# Patient Record
Sex: Female | Born: 1950 | Race: White | Hispanic: No | Marital: Married | Smoking: Former smoker
Health system: Southern US, Community
[De-identification: ages and names within clinical notes are randomized; demographics above are authoritative.]

## PROBLEM LIST (undated history)

## (undated) DIAGNOSIS — K602 Anal fissure, unspecified: Secondary | ICD-10-CM

## (undated) DIAGNOSIS — I4891 Unspecified atrial fibrillation: Secondary | ICD-10-CM

## (undated) DIAGNOSIS — F329 Major depressive disorder, single episode, unspecified: Secondary | ICD-10-CM

## (undated) DIAGNOSIS — R011 Cardiac murmur, unspecified: Secondary | ICD-10-CM

## (undated) DIAGNOSIS — F419 Anxiety disorder, unspecified: Secondary | ICD-10-CM

## (undated) DIAGNOSIS — B019 Varicella without complication: Secondary | ICD-10-CM

## (undated) DIAGNOSIS — Q249 Congenital malformation of heart, unspecified: Secondary | ICD-10-CM

## (undated) DIAGNOSIS — I340 Nonrheumatic mitral (valve) insufficiency: Secondary | ICD-10-CM

## (undated) DIAGNOSIS — I421 Obstructive hypertrophic cardiomyopathy: Secondary | ICD-10-CM

## (undated) DIAGNOSIS — G4730 Sleep apnea, unspecified: Secondary | ICD-10-CM

## (undated) DIAGNOSIS — F32A Depression, unspecified: Secondary | ICD-10-CM

## (undated) DIAGNOSIS — K219 Gastro-esophageal reflux disease without esophagitis: Secondary | ICD-10-CM

## (undated) HISTORY — DX: Depression, unspecified: F32.A

## (undated) HISTORY — DX: Congenital malformation of heart, unspecified: Q24.9

## (undated) HISTORY — DX: Cardiac murmur, unspecified: R01.1

## (undated) HISTORY — DX: Varicella without complication: B01.9

## (undated) HISTORY — DX: Unspecified atrial fibrillation: I48.91

## (undated) HISTORY — PX: ABDOMINAL HYSTERECTOMY: SHX81

## (undated) HISTORY — DX: Anxiety disorder, unspecified: F41.9

## (undated) HISTORY — DX: Major depressive disorder, single episode, unspecified: F32.9

## (undated) HISTORY — PX: TOOTH EXTRACTION: SUR596

## (undated) HISTORY — DX: Gastro-esophageal reflux disease without esophagitis: K21.9

## (undated) HISTORY — DX: Anal fissure, unspecified: K60.2

---
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
## 1997-08-29 HISTORY — PX: AUGMENTATION MAMMAPLASTY: SUR837

## 1998-08-29 HISTORY — PX: BREAST ENHANCEMENT SURGERY: SHX7

## 2009-08-01 ENCOUNTER — Other Ambulatory Visit: Payer: Self-pay

## 2009-08-01 ENCOUNTER — Other Ambulatory Visit: Payer: Self-pay | Admitting: Emergency Medicine

## 2009-11-11 ENCOUNTER — Other Ambulatory Visit: Payer: Self-pay | Admitting: Cardiology

## 2014-01-23 ENCOUNTER — Encounter (INDEPENDENT_AMBULATORY_CARE_PROVIDER_SITE_OTHER): Payer: Self-pay

## 2014-01-23 ENCOUNTER — Ambulatory Visit (INDEPENDENT_AMBULATORY_CARE_PROVIDER_SITE_OTHER): Payer: 59 | Admitting: Family Medicine

## 2014-01-23 ENCOUNTER — Encounter: Payer: Self-pay | Admitting: Family Medicine

## 2014-01-23 VITALS — BP 124/78 | HR 83 | Temp 98.4°F | Ht 67.75 in | Wt 208.5 lb

## 2014-01-23 DIAGNOSIS — R5381 Other malaise: Secondary | ICD-10-CM | POA: Insufficient documentation

## 2014-01-23 DIAGNOSIS — R5383 Other fatigue: Secondary | ICD-10-CM

## 2014-01-23 DIAGNOSIS — R1310 Dysphagia, unspecified: Secondary | ICD-10-CM | POA: Insufficient documentation

## 2014-01-23 LAB — CBC WITH DIFFERENTIAL/PLATELET
BASOS ABS: 0 10*3/uL (ref 0.0–0.1)
Basophils Relative: 0.1 % (ref 0.0–3.0)
EOS ABS: 0.1 10*3/uL (ref 0.0–0.7)
Eosinophils Relative: 1.6 % (ref 0.0–5.0)
HCT: 41.4 % (ref 36.0–46.0)
Hemoglobin: 14 g/dL (ref 12.0–15.0)
LYMPHS PCT: 39.3 % (ref 12.0–46.0)
Lymphs Abs: 1.9 10*3/uL (ref 0.7–4.0)
MCHC: 33.8 g/dL (ref 30.0–36.0)
MCV: 88.9 fl (ref 78.0–100.0)
MONOS PCT: 8.2 % (ref 3.0–12.0)
Monocytes Absolute: 0.4 10*3/uL (ref 0.1–1.0)
NEUTROS PCT: 50.8 % (ref 43.0–77.0)
Neutro Abs: 2.5 10*3/uL (ref 1.4–7.7)
PLATELETS: 209 10*3/uL (ref 150.0–400.0)
RBC: 4.66 Mil/uL (ref 3.87–5.11)
RDW: 13.8 % (ref 11.5–15.5)
WBC: 4.9 10*3/uL (ref 4.0–10.5)

## 2014-01-23 LAB — COMPREHENSIVE METABOLIC PANEL
ALT: 18 U/L (ref 0–35)
AST: 25 U/L (ref 0–37)
Albumin: 4.2 g/dL (ref 3.5–5.2)
Alkaline Phosphatase: 55 U/L (ref 39–117)
BUN: 18 mg/dL (ref 6–23)
CALCIUM: 9.5 mg/dL (ref 8.4–10.5)
CHLORIDE: 106 meq/L (ref 96–112)
CO2: 24 mEq/L (ref 19–32)
Creatinine, Ser: 1 mg/dL (ref 0.4–1.2)
GFR: 57.26 mL/min — AB (ref >60.00)
Glucose, Bld: 98 mg/dL (ref 70–99)
Potassium: 4.1 mEq/L (ref 3.5–5.1)
Sodium: 138 mEq/L (ref 135–145)
Total Bilirubin: 0.6 mg/dL (ref 0.2–1.2)
Total Protein: 7.3 g/dL (ref 6.0–8.3)

## 2014-01-23 LAB — TSH: TSH: 3.67 u[IU]/mL (ref 0.35–4.50)

## 2014-01-23 LAB — VITAMIN B12: Vitamin B-12: 209 pg/mL — ABNORMAL LOW (ref 211–911)

## 2014-01-23 LAB — T4, FREE: Free T4: 0.64 ng/dL (ref 0.60–1.60)

## 2014-01-23 NOTE — Progress Notes (Signed)
Pre visit review using our clinic review tool, if applicable. No additional management support is needed unless otherwise documented below in the visit note. 

## 2014-01-23 NOTE — Assessment & Plan Note (Signed)
Labs today. She will return in 1 month- overdue for CPX.

## 2014-01-23 NOTE — Progress Notes (Signed)
   Subjective:   Patient ID: Molly Brown, female    DOB: 02/19/1951, 63 y.o.   MRN: [id number]  Molly Brown is a pleasant 63 y.o. year old female who presents to clinic today with Establish Care and Gastrophageal Reflux  on 01/23/2014  HPI: Has not been to a doctor in over 5 years.  Dysphagia- difficulty swallowing food for past several months.  Difficulty swallowing certain foods and liquids.  Getting progressively worse.  At times, has to make herself regurgitate.  Feels stuck in epigastric area.   Denies any changes in bowel in habits. Has never had a colonoscopy or endoscopy. Does have h/o GERD. No weight loss.  Does eat more mash potatoes and ice cream because it goes down easier. No smoking or ETOH use.  Has not had a mammogram in 5 years.  Not taking any PPIs or H2 blockers- not having GERD symptoms only dysphagia at this point.  She did start taking probiotics over a month ago.  Patient Active Problem List   Diagnosis Date Noted  . Dysphagia, unspecified(787.20) 01/23/2014   Past Medical History  Diagnosis Date  . Depression   . Chicken pox   . GERD (gastroesophageal reflux disease)   . Cardiac arrhythmia due to congenital heart disease   . Heart murmur    Past Surgical History  Procedure Laterality Date  . Tonsillectomy  1972  . Abdominal hysterectomy    . Breast enhancement surgery  2000  . Tooth extraction     History  Substance Use Topics  . Smoking status: Former Games developer  . Smokeless tobacco: Never Used  . Alcohol Use: No   Family History  Problem Relation Age of Onset  . Arthritis Mother   . Heart disease Father   . Arthritis Maternal Grandmother   . Arthritis Paternal Grandmother   . Heart disease Paternal Grandmother    No Known Allergies No current outpatient prescriptions on file prior to visit.   No current facility-administered medications on file prior to visit.   The PMH, PSH, Social History, Family History, Medications, and allergies  have been reviewed in [facility], and have been updated if relevant.   Review of Systems    See HPI No black stools +fatigue- since after she had the flu in Christmas 2014- probiotics have helped a little with fatigue. No fever No CP or SOB. Denies feeling anxious or depressed Objective:    BP 124/78  Pulse 83  Temp(Src) 98.4 F (36.9 C) (Oral)  Ht 5' 7.75" (1.721 m)  Wt 208 lb 8 oz (94.575 kg)  BMI 31.93 kg/m2  SpO2 98%   Physical Exam  Nursing note and vitals reviewed. Constitutional: She appears well-developed and well-nourished. No distress.  Abdominal: Soft. Bowel sounds are normal. She exhibits no distension and no mass. There is no tenderness. There is no rebound and no guarding.  Skin: Skin is warm and dry.  Psychiatric: She has a normal mood and affect. Her behavior is normal. Judgment and thought content normal.          Assessment & Plan:   Dysphagia, unspecified(787.20) No Follow-up on file.

## 2014-01-23 NOTE — Patient Instructions (Signed)
It was nice to meet you. After you go to the lab, please stop by to see Shirlee Limerick on your way out to set your referral to see a stomach doctor.

## 2014-01-23 NOTE — Assessment & Plan Note (Signed)
Refer to GI for endoscopy-? Esophageal stricture- also need to rule out malignancy although she is low risk for this. The patient indicates understanding of these issues and agrees with the plan.

## 2014-01-24 LAB — HELICOBACTER PYLORI ABS-IGG+IGA, BLD: HELICOBACTER PYLORI AB, IGA: 8.1 U/mL (ref <9.0)

## 2014-01-24 LAB — VITAMIN D 25 HYDROXY (VIT D DEFICIENCY, FRACTURES): Vit D, 25-Hydroxy: 64 ng/mL (ref 30–89)

## 2014-01-31 ENCOUNTER — Ambulatory Visit (INDEPENDENT_AMBULATORY_CARE_PROVIDER_SITE_OTHER): Payer: 59 | Admitting: Gastroenterology

## 2014-01-31 ENCOUNTER — Encounter: Payer: Self-pay | Admitting: Gastroenterology

## 2014-01-31 VITALS — BP 116/70 | HR 92 | Ht 67.5 in | Wt 209.5 lb

## 2014-01-31 DIAGNOSIS — K219 Gastro-esophageal reflux disease without esophagitis: Secondary | ICD-10-CM

## 2014-01-31 DIAGNOSIS — R1314 Dysphagia, pharyngoesophageal phase: Secondary | ICD-10-CM

## 2014-01-31 MED ORDER — OMEPRAZOLE 40 MG PO CPDR: 40.0000 mg | DELAYED_RELEASE_CAPSULE | Freq: Every day | ORAL | Status: DC

## 2014-01-31 NOTE — Patient Instructions (Addendum)
  Please start once daily omeprazole (OTC), take one pill 20-30 min before first meal of the day. In meantime chew well, eat slowly and take small bites.You have been scheduled for an endoscopy with propofol. Please follow written instructions given to you at your visit today. If you use inhalers (even only as needed), please bring them with you on the day of your procedure. Your physician has requested that you go to www.startemmi.com and enter the access code given to you at your visit today. This web site gives a general overview about your procedure. However, you should still follow specific instructions given to you by our office regarding your preparation for the procedure.   We have sent the following medications to your pharmacy for you to pick up at your convenience:

## 2014-01-31 NOTE — Progress Notes (Signed)
HPI: This is a  very pleasant 63 year old woman whom I am meeting for the first time today.  pcp Dr. Ruthe Mannanalia Aron  Dysphagia for about a year, progressive.  Started with pyrosis.  Progressed to dysphagia, solid.  Has had to regurgitate.  A bit liquid as well.  She was taking alkaseltzer plus (for heartburn). She tried prilosec a bit.  Overall her weight has been stable.  Review of systems: Pertinent positive and negative review of systems were noted in the above HPI section. Complete review of systems was performed and was otherwise normal.    Past Medical History  Diagnosis Date  . Depression   . Chicken pox   . GERD (gastroesophageal reflux disease)   . Cardiac arrhythmia due to congenital heart disease   . Heart murmur   . Anal fissure   . Anxiety   . AF (atrial fibrillation)     Past Surgical History  Procedure Laterality Date  . Tonsillectomy  1972  . Abdominal hysterectomy    . Breast enhancement surgery  2000  . Tooth extraction      Current Outpatient Prescriptions  Medication Sig Dispense Refill  . acetaminophen (TYLENOL) 325 MG tablet Take 650 mg by mouth every 6 (six) hours as needed.      Marland Kitchen. aspirin 81 MG tablet Take 81 mg by mouth daily.      Marland Kitchen. aspirin-sod bicarb-citric acid (ALKA-SELTZER) 325 MG TBEF tablet Take 325 mg by mouth as needed.      . Multiple Vitamin (MULTIVITAMIN) LIQD Take 5 mLs by mouth daily.      . polyethylene glycol (MIRALAX / GLYCOLAX) packet Take 17 g by mouth as needed.      . Probiotic Product (PROBIOTIC DAILY) CAPS Take by mouth.       No current facility-administered medications for this visit.    Allergies as of 01/31/2014  . (No Known Allergies)    Family History  Problem Relation Age of Onset  . Arthritis Mother   . Heart disease Father   . Arthritis Maternal Grandmother   . Arthritis Paternal Grandmother   . Heart disease Paternal Grandmother     History   Social History  . Marital Status: Married    Spouse Name:  N/A    Number of Children: 3  . Years of Education: N/A   Occupational History  . homemaker    Social History Main Topics  . Smoking status: Former Smoker    Types: Cigarettes  . Smokeless tobacco: Never Used  . Alcohol Use: No  . Drug Use: No  . Sexual Activity: No   Other Topics Concern  . Not on file   Social History Narrative  . No narrative on file       Physical Exam: BP 116/70  Pulse 92  Ht 5' 7.5" (1.715 m)  Wt 209 lb 8 oz (95.029 kg)  BMI 32.31 kg/m2 Constitutional: generally well-appearing Psychiatric: alert and oriented x3 Eyes: extraocular movements intact Mouth: oral pharynx moist, no lesions Neck: supple no lymphadenopathy Cardiovascular: heart regular rate and rhythm Lungs: clear to auscultation bilaterally Abdomen: soft, nontender, nondistended, no obvious ascites, no peritoneal signs, normal bowel sounds Extremities: no lower extremity edema bilaterally Skin: no lesions on visible extremities    Assessment and plan: 63 y.o. female with  progressive, mainly solid food dysphagia over the past year previously associated with GERD. 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 She is going to start once daily  proton pump inhibitor. We will proceed with EGD at her soonest convenience. Given her lack of weight loss my suspicion for significant neoplasm is low.

## 2014-02-05 ENCOUNTER — Telehealth: Payer: Self-pay | Admitting: Gastroenterology

## 2014-02-05 ENCOUNTER — Encounter: Payer: Self-pay | Admitting: Cardiology

## 2014-02-05 ENCOUNTER — Ambulatory Visit: Payer: 59 | Admitting: Gastroenterology

## 2014-02-05 VITALS — BP 126/87 | Temp 97.4°F | Ht 67.5 in | Wt 209.0 lb

## 2014-02-05 DIAGNOSIS — R1314 Dysphagia, pharyngoesophageal phase: Secondary | ICD-10-CM

## 2014-02-05 DIAGNOSIS — I4891 Unspecified atrial fibrillation: Secondary | ICD-10-CM

## 2014-02-05 NOTE — Telephone Encounter (Signed)
Dr. Myrtis Ser cell phone [phone], Dr. Christella Hartigan to call back regarding patient.

## 2014-02-05 NOTE — Progress Notes (Addendum)
Pt arrived to admitting for EGD and vital signs obtained.  Her HR was unstable so she was put a monitor and strip was obtained.  Pt is in atrial fibrilation. Pt has a hx of afib but is not currently taking any medications.  She has not seen a cardiologist in 5 years.  She states that she has noticed feeling very tired lately.   Dr. Christella Hartigan was notified.   MD discussed this with pt and decided that procedure would be cancelled.  We are attempting to have pt referred to cardiologist today.      She was found to be in afib today.  Not rapid response. Feels fine (no CP, no SOB).  A bit fatigued lately. Was in afib 5 years ago, stopped meds, hasn't seen cards in 5 years.  HEr swallowing is much improved, dysphagia nearly gone since starting omeprazole.  I have cancelled the EGD for today.  We contacted heartcare about new office appt. Family asked about fitting her in today but given non emergent, urgent nature that was not possible. She will be called by heartcare about appt and knows to contact my office afterwards.

## 2014-02-05 NOTE — Progress Notes (Signed)
Unable to obtain appointment for pt today but referral was made to cardiology.  Pt D/C home with husband.

## 2014-02-05 NOTE — Progress Notes (Signed)
Patient ID: Molly Brown, female   DOB: 1950/12/21, 63 y.o.   MRN: [id number]  The patient was going to have an endoscopy today by Dr. Christella Hartigan. She noted that she was feeling better on her PPI. Also it was noted that she had atrial fibrillation. She was stable with this. Considering all factors, decision was made to not proceed with endoscopy. The patient told Dr. Christella Hartigan that she had had atrial fibrillation in the past. However there is been no known cardiology followup. He called and we discussed the issue. She lives in [place]. Decision was made to make her a an appointment with our cardiology team in the [facility] office to assess her atrial fibrillation. She has no symptoms at this time.  Jerral Bonito, MD

## 2014-02-07 ENCOUNTER — Ambulatory Visit (INDEPENDENT_AMBULATORY_CARE_PROVIDER_SITE_OTHER): Payer: 59 | Admitting: Cardiovascular Disease

## 2014-02-07 ENCOUNTER — Encounter: Payer: Self-pay | Admitting: Cardiovascular Disease

## 2014-02-07 VITALS — BP 134/95 | HR 92 | Ht 67.0 in | Wt 213.5 lb

## 2014-02-07 DIAGNOSIS — I4891 Unspecified atrial fibrillation: Secondary | ICD-10-CM

## 2014-02-07 DIAGNOSIS — R000 Tachycardia, unspecified: Secondary | ICD-10-CM

## 2014-02-07 DIAGNOSIS — I482 Chronic atrial fibrillation, unspecified: Secondary | ICD-10-CM | POA: Insufficient documentation

## 2014-02-07 MED ORDER — METOPROLOL TARTRATE 25 MG PO TABS: 25.0000 mg | ORAL_TABLET | Freq: Two times a day (BID) | ORAL | Status: DC

## 2014-02-07 MED ORDER — APIXABAN 5 MG PO TABS: 5.0000 mg | ORAL_TABLET | Freq: Two times a day (BID) | ORAL | Status: DC

## 2014-02-07 NOTE — Patient Instructions (Signed)
Medication changes:   STOP aspirin                                     Start:  Eliquis 5 mg 1 tablet twice a day                                     Start:  Metoprolol tartrate 25mg  1 tablet twice a day  Your physician has requested that you have an echocardiogram before your cardioversion.  Echocardiography is a painless test that uses sound waves to create images of your heart. It provides your doctor with information about the size and shape of your heart and how well your heart's chambers and valves are working. This procedure takes approximately one hour. There are no restrictions for this procedure.  Your physician has recommended that you have a Cardioversion (DCCV) in 3 weeks. We will call you when we have this scheduled. You will also need lab work before your cardioversion.   Electrical Cardioversion uses a jolt of electricity to your heart either through paddles or wired patches attached to your chest. This is a controlled, usually prescheduled, procedure. Defibrillation is done under light anesthesia in the hospital, and you usually go home the day of the procedure. This is done to get your heart back into a normal rhythm. You are not awake for the procedure. Please see the instruction sheet given to you today.  Your physician recommends that you return for lab work when you come in for your ECHO before your cardioversion:   Bmp, cbc-d, pt/inr
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
## 2014-02-07 NOTE — Assessment & Plan Note (Signed)
The patient seems to have paroxysmal atrial fibrillation. Currently, she seems to be very symptomatic with significant exertional dyspnea palpitations. Thus, I favor attempting to restore sinus rhythm especially with relatively young age. I started her on metoprolol 25 mg twice daily. I also initiated anticoagulation with Eliquis 5 mg twice daily. Plan cardioversion in 3 weeks. Obtain an echocardiogram in the meanwhile. Risk of cardioversion was discussed with the patient. CHADS2 VASc score is 1. Thus, we might be able to discontinue anticoagulation one month after maintaining sinus rhythm.

## 2014-02-11 ENCOUNTER — Telehealth: Payer: Self-pay | Admitting: *Deleted

## 2014-02-11 ENCOUNTER — Encounter: Payer: Self-pay | Admitting: *Deleted

## 2014-02-11 DIAGNOSIS — I4891 Unspecified atrial fibrillation: Secondary | ICD-10-CM

## 2014-02-11 NOTE — Telephone Encounter (Signed)
Your physician has recommended that you have a Cardioversion (DCCV). Electrical Cardioversion uses a jolt of electricity to your heart either through paddles or wired patches attached to your chest. This is a controlled, usually prescheduled, procedure. Defibrillation is done under light anesthesia in the hospital, and you usually go home the day of the procedure. This is done to get your heart back into a normal rhythm. You are not awake for the procedure. Please see the instruction sheet given to you today.  Spoke with patient.  Informed her that her cardioversion will be 02/27/14 at 0730 am  [facility]30 am  Reviewed instructions and lab/EKG time and date (see letter)  Patient verbalized understanding

## 2014-02-21 ENCOUNTER — Other Ambulatory Visit: Payer: Self-pay

## 2014-02-21 ENCOUNTER — Ambulatory Visit (INDEPENDENT_AMBULATORY_CARE_PROVIDER_SITE_OTHER): Payer: 59 | Admitting: *Deleted

## 2014-02-21 ENCOUNTER — Other Ambulatory Visit (INDEPENDENT_AMBULATORY_CARE_PROVIDER_SITE_OTHER): Payer: 59

## 2014-02-21 VITALS — Ht 67.0 in | Wt 215.0 lb

## 2014-02-21 DIAGNOSIS — I059 Rheumatic mitral valve disease, unspecified: Secondary | ICD-10-CM

## 2014-02-21 DIAGNOSIS — I4891 Unspecified atrial fibrillation: Secondary | ICD-10-CM

## 2014-02-21 DIAGNOSIS — R000 Tachycardia, unspecified: Secondary | ICD-10-CM

## 2014-02-21 DIAGNOSIS — I359 Nonrheumatic aortic valve disorder, unspecified: Secondary | ICD-10-CM

## 2014-02-21 NOTE — Patient Instructions (Signed)
Patient is going for a cardioversion.

## 2014-02-22 LAB — BASIC METABOLIC PANEL
BUN/Creatinine Ratio: 17 (ref 11–26)
BUN: 18 mg/dL (ref 8–27)
CALCIUM: 9.6 mg/dL (ref 8.7–10.3)
CO2: 23 mmol/L (ref 18–29)
Chloride: 102 mmol/L (ref 97–108)
Creatinine, Ser: 1.05 mg/dL — ABNORMAL HIGH (ref 0.57–1.00)
GFR calc Af Amer: 66 mL/min/{1.73_m2} (ref >59)
GFR, EST NON AFRICAN AMERICAN: 57 mL/min/{1.73_m2} — AB (ref >59)
Glucose: 116 mg/dL — ABNORMAL HIGH (ref 65–99)
POTASSIUM: 4.4 mmol/L (ref 3.5–5.2)
Sodium: 141 mmol/L (ref 134–144)

## 2014-02-22 LAB — PROTIME-INR
INR: 1 (ref 0.8–1.2)
Prothrombin Time: 10.4 s (ref 9.1–12.0)

## 2014-02-22 LAB — CBC WITH DIFFERENTIAL/PLATELET
BASOS: 0 %
Basophils Absolute: 0 10*3/uL (ref 0.0–0.2)
EOS ABS: 0.2 10*3/uL (ref 0.0–0.4)
EOS: 4 %
HCT: 40.6 % (ref 34.0–46.6)
Hemoglobin: 13.5 g/dL (ref 11.1–15.9)
IMMATURE GRANS (ABS): 0 10*3/uL (ref 0.0–0.1)
Immature Granulocytes: 0 %
Lymphocytes Absolute: 1.6 10*3/uL (ref 0.7–3.1)
Lymphs: 34 %
MCH: 29.7 pg (ref 26.6–33.0)
MCHC: 33.3 g/dL (ref 31.5–35.7)
MCV: 89 fL (ref 79–97)
Monocytes Absolute: 0.4 10*3/uL (ref 0.1–0.9)
Monocytes: 8 %
NEUTROS PCT: 54 %
Neutrophils Absolute: 2.5 10*3/uL (ref 1.4–7.0)
RBC: 4.55 x10E6/uL (ref 3.77–5.28)
RDW: 14.2 % (ref 12.3–15.4)
WBC: 4.6 10*3/uL (ref 3.4–10.8)

## 2014-02-24 ENCOUNTER — Encounter: Payer: Self-pay | Admitting: Gastroenterology

## 2014-02-24 ENCOUNTER — Encounter: Payer: Self-pay | Admitting: Family Medicine

## 2014-02-24 ENCOUNTER — Ambulatory Visit (INDEPENDENT_AMBULATORY_CARE_PROVIDER_SITE_OTHER): Payer: 59 | Admitting: Family Medicine

## 2014-02-24 ENCOUNTER — Other Ambulatory Visit (HOSPITAL_COMMUNITY)
Admission: RE | Admit: 2014-02-24 | Discharge: 2014-02-24 | Disposition: A | Discharge status: Home / Self Care | Payer: 59 | Source: Ambulatory Visit | Attending: Family Medicine | Admitting: Family Medicine

## 2014-02-24 VITALS — BP 130/78 | HR 81 | Temp 98.1°F | Ht 67.75 in | Wt 212.5 lb

## 2014-02-24 DIAGNOSIS — Z1151 Encounter for screening for human papillomavirus (HPV): Secondary | ICD-10-CM | POA: Diagnosis present

## 2014-02-24 DIAGNOSIS — Z1211 Encounter for screening for malignant neoplasm of colon: Secondary | ICD-10-CM

## 2014-02-24 DIAGNOSIS — Z1231 Encounter for screening mammogram for malignant neoplasm of breast: Secondary | ICD-10-CM

## 2014-02-24 DIAGNOSIS — I4891 Unspecified atrial fibrillation: Secondary | ICD-10-CM

## 2014-02-24 DIAGNOSIS — Z01419 Encounter for gynecological examination (general) (routine) without abnormal findings: Secondary | ICD-10-CM | POA: Insufficient documentation

## 2014-02-24 DIAGNOSIS — Z0000 Encounter for general adult medical examination without abnormal findings: Secondary | ICD-10-CM | POA: Insufficient documentation

## 2014-02-24 DIAGNOSIS — R1310 Dysphagia, unspecified: Secondary | ICD-10-CM

## 2014-02-24 DIAGNOSIS — I480 Paroxysmal atrial fibrillation: Secondary | ICD-10-CM

## 2014-02-24 DIAGNOSIS — Z136 Encounter for screening for cardiovascular disorders: Secondary | ICD-10-CM

## 2014-02-24 LAB — COMPREHENSIVE METABOLIC PANEL
ALT: 18 U/L (ref 0–35)
AST: 25 U/L (ref 0–37)
Albumin: 4.4 g/dL (ref 3.5–5.2)
Alkaline Phosphatase: 62 U/L (ref 39–117)
BILIRUBIN TOTAL: 0.6 mg/dL (ref 0.2–1.2)
BUN: 17 mg/dL (ref 6–23)
CHLORIDE: 105 meq/L (ref 96–112)
CO2: 26 mEq/L (ref 19–32)
CREATININE: 1.1 mg/dL (ref 0.4–1.2)
Calcium: 9.4 mg/dL (ref 8.4–10.5)
GFR: 53.66 mL/min — ABNORMAL LOW (ref >60.00)
GLUCOSE: 104 mg/dL — AB (ref 70–99)
Potassium: 4.2 mEq/L (ref 3.5–5.1)
Sodium: 139 mEq/L (ref 135–145)
Total Protein: 7.8 g/dL (ref 6.0–8.3)

## 2014-02-24 LAB — LIPID PANEL
CHOLESTEROL: 233 mg/dL — AB (ref 0–200)
HDL: 44.3 mg/dL (ref >39.00)
LDL Cholesterol: 156 mg/dL — ABNORMAL HIGH (ref 0–99)
NONHDL: 188.7
Total CHOL/HDL Ratio: 5
Triglycerides: 166 mg/dL — ABNORMAL HIGH (ref 0.0–149.0)
VLDL: 33.2 mg/dL (ref 0.0–40.0)

## 2014-02-24 NOTE — Progress Notes (Signed)
Pre visit review using our clinic review tool, if applicable. No additional management support is needed unless otherwise documented below in the visit note. 

## 2014-02-24 NOTE — Assessment & Plan Note (Signed)
On eliquis, lopressor. Scheduled for cardioversion this week (Dr. Kirke CorinArida).

## 2014-02-24 NOTE — Progress Notes (Signed)
Subjective:   Patient ID: Molly Brown, female    DOB: 02/16/1951, 63 y.o.   MRN: [id number]  Molly PortLinda Dearcos is a pleasant 63 y.o. year old female who presents to clinic today with Annual Exam  on 02/24/2014  HPI: Established care with me on 5/28- abdominal exam only at that time- as her complaint was dysphagia. Referred to GI for EGD- scheduled for 02/05/14- did not do EGD because was found to be in afib. Symptoms have resolved since starting omeprazole.  Dr. Christella HartiganJacobs has not rescheduled EGD yet.  Referred her to Dr. Kirke CorinArida- saw him on 6/12- note reviewed- diagnosed with paroxysmal a fib- started on Metoprolol, Eliquis, and scheduled cardioversion for 7/2.  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 Has never had a colonoscopy. Due for mammogram.  Lab Results  Component Value Date   WBC 4.6 02/21/2014   HGB 13.5 02/21/2014   HCT 40.6 02/21/2014   MCV 89 02/21/2014   PLT 209.0 01/23/2014   Lab Results  Component Value Date   CREATININE 1.05* 02/21/2014     Current Outpatient Prescriptions on File Prior to Visit  Medication Sig Dispense Refill  . acetaminophen (TYLENOL) 325 MG tablet Take 650 mg by mouth every 6 (six) hours as needed.      Marland Kitchen. apixaban (ELIQUIS) 5 MG TABS tablet Take 1 tablet (5 mg total) by mouth 2 (two) times daily.  60 tablet  6  . metoprolol tartrate (LOPRESSOR) 25 MG tablet Take 1 tablet (25 mg total) by mouth 2 (two) times daily.  180 tablet  3  . Multiple Vitamin (MULTIVITAMIN) LIQD Take 5 mLs by mouth daily.      Marland Kitchen. omeprazole (PRILOSEC) 40 MG capsule Take 1 capsule (40 mg total) by mouth daily.  90 capsule  3  . polyethylene glycol (MIRALAX / GLYCOLAX) packet Take 17 g by mouth as needed.      . Probiotic Product (PROBIOTIC DAILY) CAPS Take by mouth.       No current facility-administered medications on file prior to visit.    No Known Allergies  Past Medical History  Diagnosis Date  . Depression   . Chicken  pox   . GERD (gastroesophageal reflux disease)   . Cardiac arrhythmia due to congenital heart disease   . Heart murmur   . Anal fissure   . Anxiety   . AF (atrial fibrillation)     Past Surgical History  Procedure Laterality Date  . Tonsillectomy  1972  . Abdominal hysterectomy    . Breast enhancement surgery  2000  . Tooth extraction      Family History  Problem Relation Age of Onset  . Arthritis Mother   . Heart disease Father   . Arthritis Maternal Grandmother   . Arthritis Paternal Grandmother   . Heart disease Paternal Grandmother     History   Social History  . Marital Status: Married    Spouse Name: N/A    Number of Children: 3  . Years of Education: N/A   Occupational History  . homemaker    Social History Main Topics  . Smoking status: Former Smoker    Types: Cigarettes  . Smokeless tobacco: Never Used  . Alcohol Use: No  . Drug Use: No  . Sexual Activity: No   Other Topics Concern  . Not on file   Social History Narrative  . No narrative on file   The PMH, PSH, Social History,  Family History, Medications, and allergies [facility], and have been updated if relevant.   Review of Systems See HPI    Fatigue improved Denies any CP or palpitations No blood in stool No changes in bowel habits Objective:    BP 130/78  Pulse 81  Temp(Src) 98.1 F (36.7 C) (Oral)  Ht 5' 7.75" (1.721 m)  Wt 212 lb 8 oz (96.389 kg)  BMI 32.54 kg/m2  SpO2 98%   Physical Exam    General:  Well-developed,well-nourished,in no acute distress; alert,appropriate and cooperative throughout examination Head:  normocephalic and atraumatic.   Eyes:  vision grossly intact, pupils equal, pupils round, and pupils reactive to light.   Ears:  R ear normal and L ear normal.   Nose:  no external deformity.   Mouth:  good dentition.   Neck:  No deformities, masses, or tenderness noted. Breasts:  No mass, nodules, thickening, tenderness, bulging,  retraction, inflamation, nipple discharge or skin changes noted.   Lungs:  Normal respiratory effort, chest expands symmetrically. Lungs are clear to auscultation, no crackles or wheezes. Heart:  Irregularly irregular Abdomen:  Bowel sounds positive,abdomen soft and non-tender without masses, organomegaly or hernias noted. Rectal:  no external abnormalities.   Genitalia:  Pelvic Exam:        External: normal female genitalia without lesions or masses        Vagina: normal without lesions or masses        Cervix: normal without lesions or masses        Adnexa: normal bimanual exam without masses or fullness        Uterus:absent        Pap smear: performed Msk:  No deformity or scoliosis noted of thoracic or lumbar spine.   Extremities:  No clubbing, cyanosis, edema, or deformity noted with normal full range of motion of all joints.   Neurologic:  alert & oriented X3 and gait normal.   Skin:  Intact without suspicious lesions or rashes Cervical Nodes:  No lymphadenopathy noted Axillary Nodes:  No palpable lymphadenopathy Psych:  Cognition and judgment appear intact. Alert and cooperative with normal attention span and concentration. No apparent delusions, illusions, hallucinations      Assessment & Plan:   Routine general medical examination at a health care facility  Encounter for routine gynecological examination  Dysphagia, unspecified(787.20)  Paroxysmal atrial fibrillation No Follow-up on file.

## 2014-02-24 NOTE — Assessment & Plan Note (Signed)
Improved with omeprazole

## 2014-02-24 NOTE — Patient Instructions (Signed)
It was great to see you.  Check with your insurance to see if they will cover the shingles shot.  Please call to schedule your mammogram at your convenience.  Good luck with your cardioversion.

## 2014-02-24 NOTE — Assessment & Plan Note (Signed)
Pap smear done today- cervix was visualized during pelvic exam today. Mammogram ordered- she will call to set this up.

## 2014-02-24 NOTE — Assessment & Plan Note (Signed)
Reviewed preventive care protocols, scheduled due services, and updated immunizations Discussed nutrition, exercise, diet, and healthy lifestyle.  Orders Placed This Encounter  Procedures  . MM Digital Screening  . Comprehensive metabolic panel  . Lipid panel  . Ambulatory referral to Gastroenterology   She will check with her insurance company regarding zostavax coverage.

## 2014-02-25 LAB — CYTOLOGY - PAP

## 2014-02-26 ENCOUNTER — Encounter: Payer: Self-pay | Admitting: *Deleted

## 2014-02-27 ENCOUNTER — Ambulatory Visit: Payer: Self-pay | Admitting: Cardiovascular Disease

## 2014-02-27 DIAGNOSIS — I4891 Unspecified atrial fibrillation: Secondary | ICD-10-CM

## 2014-03-06 ENCOUNTER — Ambulatory Visit (INDEPENDENT_AMBULATORY_CARE_PROVIDER_SITE_OTHER): Payer: 59 | Admitting: Cardiovascular Disease

## 2014-03-06 ENCOUNTER — Encounter: Payer: Self-pay | Admitting: Cardiovascular Disease

## 2014-03-06 VITALS — BP 110/80 | HR 86 | Ht 67.0 in | Wt 213.5 lb

## 2014-03-06 DIAGNOSIS — I4891 Unspecified atrial fibrillation: Secondary | ICD-10-CM

## 2014-03-06 NOTE — Assessment & Plan Note (Signed)
The patient is back in atrial fibrillation after recent successful cardioversion to normal sinus rhythm. She seems to have persistent atrial fibrillation. Echocardiogram did show moderately dilated left atrium slightly dizzy possible that she had atrial fibrillation on and off for a period of time and might be difficult to keep her in sinus rhythm without an antiarrhythmic medication. Interestingly, symptoms improved significantly just with the addition of metoprolol. She does not feel significantly limited at the present time even though she is in atrial fibrillation. I discussed management options including continued rate control versus trying an antiarrhythmic medication or considering catheter ablation. Given that lack of significant symptoms at the present time, it might be just reasonable to continue with rate control. CHADS2 VASc score is 1. Thus, she does not require long-term anticoagulation currently. I asked her to continue Eliquis for another month given recent cardioversion. After that, she can switch to aspirin 81 mg once daily. I asked her to continue to monitor her symptoms. If she becomes symptomatic, the other options will be considered.

## 2014-03-06 NOTE — Patient Instructions (Signed)
Your physician recommends that you continue on your Eliquis for 1 month then switch to Aspirin 81 mg daily.   Your physician recommends that you schedule a follow-up appointment in:  3 months
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
## 2014-03-12 ENCOUNTER — Ambulatory Visit: Payer: 59 | Admitting: Gastroenterology

## 2014-03-14 ENCOUNTER — Ambulatory Visit (INDEPENDENT_AMBULATORY_CARE_PROVIDER_SITE_OTHER): Payer: 59 | Admitting: Family Medicine

## 2014-03-14 ENCOUNTER — Encounter: Payer: Self-pay | Admitting: Family Medicine

## 2014-03-14 VITALS — BP 118/70 | HR 79 | Temp 98.2°F | Wt 214.0 lb

## 2014-03-14 DIAGNOSIS — I4819 Other persistent atrial fibrillation: Secondary | ICD-10-CM

## 2014-03-14 DIAGNOSIS — Z23 Encounter for immunization: Secondary | ICD-10-CM

## 2014-03-14 DIAGNOSIS — I4891 Unspecified atrial fibrillation: Secondary | ICD-10-CM

## 2014-03-14 DIAGNOSIS — Z2911 Encounter for prophylactic immunotherapy for respiratory syncytial virus (RSV): Secondary | ICD-10-CM

## 2014-03-14 NOTE — Progress Notes (Signed)
Pre visit review using our clinic review tool, if applicable. No additional management support is needed unless otherwise documented below in the visit note. 

## 2014-03-14 NOTE — Progress Notes (Signed)
Subjective:   Patient ID: Molly Brown, female    DOB: October 11, 1950, 63 y.o.   MRN: [id number]  Molly Brown is a pleasant 63 y.o. year old female who presents to clinic today with Follow-up  on 03/14/2014  HPI: Afib-  Unfortunately back in afib after successful cardioversion.  Seeing Dr. Kirke Corin. Last saw him on 7/9- note reviewed.  Recommended staying on Eliquis for one more month (given recent cardioversion), the transition back to ASA 81 mg daily. Since her symptoms seems to be relatively controlled on metoprolol, she plans to continue this.  IF she does become symptomatic, they will need to address other alternatives like antiarrhythmic meds or catheter ablation.  She denies CP or SOB.  Lab Results  Component Value Date   WBC 4.6 02/21/2014   HGB 13.5 02/21/2014   HCT 40.6 02/21/2014   MCV 89 02/21/2014   PLT 209.0 01/23/2014   Lab Results  Component Value Date   CREATININE 1.1 02/24/2014     Current Outpatient Prescriptions on File Prior to Visit  Medication Sig Dispense Refill  . acetaminophen (TYLENOL) 325 MG tablet Take 650 mg by mouth every 6 (six) hours as needed.      Marland Kitchen apixaban (ELIQUIS) 5 MG TABS tablet Take 1 tablet (5 mg total) by mouth 2 (two) times daily.  60 tablet  6  . metoprolol tartrate (LOPRESSOR) 25 MG tablet Take 1 tablet (25 mg total) by mouth 2 (two) times daily.  180 tablet  3  . Multiple Vitamin (MULTIVITAMIN) LIQD Take 5 mLs by mouth daily.      Marland Kitchen omeprazole (PRILOSEC) 40 MG capsule Take 1 capsule (40 mg total) by mouth daily.  90 capsule  3  . polyethylene glycol (MIRALAX / GLYCOLAX) packet Take 17 g by mouth as needed.      . Probiotic Product (PROBIOTIC DAILY) CAPS Take by mouth.       No current facility-administered medications on file prior to visit.    No Known Allergies  Past Medical History  Diagnosis Date  . Depression   . Chicken pox   . GERD (gastroesophageal reflux disease)   . Cardiac arrhythmia due to congenital heart disease     . Heart murmur   . Anal fissure   . Anxiety   . AF (atrial fibrillation)     Past Surgical History  Procedure Laterality Date  . Tonsillectomy  1972  . Abdominal hysterectomy    . Breast enhancement surgery  2000  . Tooth extraction      Family History  Problem Relation Age of Onset  . Arthritis Mother   . Heart disease Father   . Arthritis Maternal Grandmother   . Arthritis Paternal Grandmother   . Heart disease Paternal Grandmother     History   Social History  . Marital Status: Married    Spouse Name: N/A    Number of Children: 3  . Years of Education: N/A   Occupational History  . homemaker    Social History Main Topics  . Smoking status: Former Smoker    Types: Cigarettes  . Smokeless tobacco: Never Used  . Alcohol Use: No  . Drug Use: No  . Sexual Activity: No   Other Topics Concern  . Not on file   Social History Narrative  . No narrative on file   The PMH, PSH, Social History, Family History, Medications, and allergies have been reviewed in [facility], and have been updated if relevant.   Review of  Systems See HPI    Fatigue improved Denies any CP or palpitations No blood in stool No changes in bowel habits Objective:    BP 118/70  Pulse 79  Temp(Src) 98.2 F (36.8 C) (Oral)  Wt 214 lb (97.07 kg)  SpO2 98%   Physical Exam    General:  Well-developed,well-nourished,in no acute distress; alert,appropriate and cooperative throughout examination Head:  normocephalic and atraumatic.   Eyes:  vision grossly intact, pupils equal, pupils round, and pupils reactive to light.   Ears:  R ear normal and L ear normal.   Nose:  no external deformity.   Mouth:  good dentition.   Neck:  No deformities, masses, or tenderness noted. Lungs:  Normal respiratory effort, chest expands symmetrically. Lungs are clear to auscultation, no crackles or wheezes. Heart:  Irregularly irregular Msk:  No deformity or scoliosis noted of thoracic or lumbar spine.    Extremities:  No clubbing, cyanosis, edema, or deformity noted with normal full range of motion of all joints.   Neurologic:  alert & oriented X3 and gait normal.   Skin:  Intact without suspicious lesions or rashes Cervical Nodes:  No lymphadenopathy noted Axillary Nodes:  No palpable lymphadenopathy Psych:  Cognition and judgment appear intact. Alert and cooperative with normal attention span and concentration. No apparent delusions, illusions, hallucinations      Assessment & Plan:   Persistent atrial fibrillation No Follow-up on file.

## 2014-03-14 NOTE — Patient Instructions (Signed)
Wonderful to see you. Have fun making baby food!  Follow up with me in the next few months or as needed.

## 2014-03-14 NOTE — Assessment & Plan Note (Signed)
Currently rate controlled, asymptomatic. Eliquis for one month, then transition to ASA 81 mg daily. Follow up with cards in October 2015.

## 2014-03-14 NOTE — Addendum Note (Signed)
Addended by: Desmond DikeKNIGHT, Charice Zuno H on: 03/14/2014 01:14 PM   Modules accepted: Orders

## 2014-04-15 ENCOUNTER — Ambulatory Visit: Payer: Self-pay | Admitting: Family Medicine

## 2014-04-17 ENCOUNTER — Encounter: Payer: Self-pay | Admitting: Family Medicine

## 2014-05-13 ENCOUNTER — Encounter: Payer: 59 | Admitting: Gastroenterology

## 2014-06-05 ENCOUNTER — Ambulatory Visit (INDEPENDENT_AMBULATORY_CARE_PROVIDER_SITE_OTHER): Payer: 59 | Admitting: Cardiovascular Disease

## 2014-06-05 ENCOUNTER — Encounter: Payer: Self-pay | Admitting: Cardiovascular Disease

## 2014-06-05 VITALS — BP 126/90 | HR 70 | Ht 67.5 in | Wt 221.5 lb

## 2014-06-05 DIAGNOSIS — R0789 Other chest pain: Secondary | ICD-10-CM

## 2014-06-05 DIAGNOSIS — R0609 Other forms of dyspnea: Secondary | ICD-10-CM

## 2014-06-05 DIAGNOSIS — I4891 Unspecified atrial fibrillation: Secondary | ICD-10-CM

## 2014-06-05 DIAGNOSIS — R002 Palpitations: Secondary | ICD-10-CM

## 2014-06-05 NOTE — Assessment & Plan Note (Signed)
If ischemic workup is negative and she continues to have significant exertional dyspnea, I will then consider starting an antiarrhythmic medication followed by repeat cardioversion after anticoagulation.
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
## 2014-06-05 NOTE — Patient Instructions (Addendum)
[facility] MYOVIEW  Your caregiver has ordered a Stress Test with nuclear imaging. The purpose of this test is to evaluate the blood supply to your heart muscle. This procedure is referred to as a "Non-Invasive Stress Test." This is because other than having an IV started in your vein, nothing is inserted or "invades" your body. Cardiac stress tests are done to find areas of poor blood flow to the heart by determining the extent of coronary artery disease (CAD). Some patients exercise on a treadmill, which naturally increases the blood flow to your heart, while others who are  unable to walk on a treadmill due to physical limitations have a pharmacologic/chemical stress agent called Lexiscan . This medicine will mimic walking on a treadmill by temporarily increasing your coronary blood flow.   Please note: these test may take anywhere between 2-4 hours to complete  [facility]  THE VOLUNTEERS AT THE FIRST DESK WILL DIRECT YOU WHERE TO GO  Date of Procedure:   OCT. 15, 2015  Arrival Time for Procedure: 7:45  Instructions regarding medication:    _x___:  Hold betablocker(s) night before procedure and morning of procedure  Do not take the Metoprolol the am of stress test.          PLEASE NOTIFY THE OFFICE AT LEAST 24 HOURS IN ADVANCE IF YOU ARE UNABLE TO KEEP YOUR APPOINTMENT.  [phone] AND  PLEASE NOTIFY [facility] AT LEAST 24 HOURS IN ADVANCE IF YOU ARE UNABLE TO KEEP YOUR APPOINTMENT. [phone]     How to prepare for your Myoview test:  1. Do not eat or drink after midnight 2. No caffeine for 24 hours prior to test 3. No smoking 24 hours prior to test. 4. Your medication may be taken with water.  If your doctor stopped a medication because of this test, do not take that medication. 5. Ladies, please do not wear dresses.  Skirts or pants are appropriate. Please wear a short sleeve shirt. 6. No perfume, cologne or lotion. 7. Wear  comfortable walking shoes. No heels!  Follow up with Dr. Kirke CorinArida in 2 months.

## 2014-06-05 NOTE — Assessment & Plan Note (Signed)
The patient reports progressive exertional dyspnea over the last 3-6 months. Differential diagnoses include ischemic heart disease, effect of atrial fibrillation or physical deconditioning. I recommend evaluation with a nuclear stress test. She does have atrial fibrillation and we might have to consider pharmacologic testing. If stress test is negative, then I advised her to try to increase her physical activities and exercise.

## 2014-06-12 ENCOUNTER — Ambulatory Visit: Payer: Self-pay | Admitting: Cardiovascular Disease

## 2014-06-12 DIAGNOSIS — R079 Chest pain, unspecified: Secondary | ICD-10-CM

## 2014-06-13 ENCOUNTER — Telehealth: Payer: Self-pay | Admitting: *Deleted

## 2014-06-13 ENCOUNTER — Other Ambulatory Visit: Payer: Self-pay

## 2014-06-13 DIAGNOSIS — R0789 Other chest pain: Secondary | ICD-10-CM

## 2014-06-13 DIAGNOSIS — R000 Tachycardia, unspecified: Secondary | ICD-10-CM

## 2014-06-13 MED ORDER — METOPROLOL TARTRATE 50 MG PO TABS: 50.0000 mg | ORAL_TABLET | Freq: Two times a day (BID) | ORAL | Status: DC

## 2014-06-13 NOTE — Telephone Encounter (Signed)
Message copied by Fransico SettersBAUCOM, Johnni Wunschel E on Fri Jun 13, 2014  5:09 PM ------      Message from: Lorine BearsARIDA, MUHAMMAD A      Created: Fri Jun 13, 2014 12:16 PM       Inform patient that  stress test was normal. However, her A-fib rate was very high. Increase Metoprolol to 50 mg twice daily. Follow up with me in 2 weeks. ------

## 2014-06-27 ENCOUNTER — Encounter: Payer: Self-pay | Admitting: Cardiovascular Disease

## 2014-06-27 ENCOUNTER — Ambulatory Visit (INDEPENDENT_AMBULATORY_CARE_PROVIDER_SITE_OTHER): Payer: 59 | Admitting: Cardiovascular Disease

## 2014-06-27 VITALS — BP 134/91 | HR 74 | Ht 67.0 in | Wt 216.5 lb

## 2014-06-27 DIAGNOSIS — R0609 Other forms of dyspnea: Secondary | ICD-10-CM

## 2014-06-27 DIAGNOSIS — I4891 Unspecified atrial fibrillation: Secondary | ICD-10-CM

## 2014-06-27 NOTE — Assessment & Plan Note (Signed)
Symptoms improved significantly after increasing the dose of metoprolol. Rate control appears to be better. Continue current dose. CAHDS2 VASc score is 1. Continue aspirin for now.

## 2014-06-27 NOTE — Patient Instructions (Signed)
Continue same medications.   Your physician wants you to follow-up in: 6 months.  You will receive a reminder letter in the mail two months in advance. If you don't receive a letter, please call our office to schedule the follow-up appointment.  Your next appointment will be scheduled in our new office located at :  [facility]  [address], [address]  [place], [place] [address]  
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
## 2014-06-27 NOTE — Assessment & Plan Note (Signed)
This was likely due to atrial fibrillation with rapid ventricular response. Nuclear stress test showed no evidence of ischemia. This has resolved. I asked her to start an exercise program.

## 2014-08-07 ENCOUNTER — Ambulatory Visit: Payer: 59 | Admitting: Cardiovascular Disease

## 2014-09-02 ENCOUNTER — Other Ambulatory Visit: Payer: Self-pay | Admitting: *Deleted

## 2014-09-02 MED ORDER — METOPROLOL TARTRATE 50 MG PO TABS: 50.0000 mg | ORAL_TABLET | Freq: Two times a day (BID) | ORAL | Status: DC

## 2014-09-03 ENCOUNTER — Other Ambulatory Visit: Payer: Self-pay

## 2014-09-03 MED ORDER — METOPROLOL TARTRATE 50 MG PO TABS: 50.0000 mg | ORAL_TABLET | Freq: Two times a day (BID) | ORAL | Status: DC

## 2014-09-03 NOTE — Telephone Encounter (Signed)
Refill sent for metoprolol.  

## 2014-12-07 ENCOUNTER — Telehealth: Payer: Self-pay | Admitting: Physician Assistant

## 2014-12-07 NOTE — Telephone Encounter (Signed)
Tanja PortLinda Garber is a [age] y.o. female with a hx of AFib.  She is on Metoprolol 50 mg bid. She accidentally took both tablets this AM. She is anxious about this but denies any chest pain, dyspnea, syncope. I reassured her that she will be ok. She can push oral fluids today. She can hold her evening Metoprolol and resume her usual regimen tomorrow. She agrees with this plan. Tereso NewcomerScott Lakeithia Rasor, PA-C   12/07/2014 11:36 AM

## 2014-12-26 ENCOUNTER — Ambulatory Visit: Payer: 59 | Admitting: Cardiovascular Disease

## 2015-01-18 ENCOUNTER — Other Ambulatory Visit: Payer: Self-pay | Admitting: Gastroenterology

## 2015-04-27 ENCOUNTER — Encounter: Payer: Self-pay | Admitting: Cardiovascular Disease

## 2015-04-27 ENCOUNTER — Ambulatory Visit (INDEPENDENT_AMBULATORY_CARE_PROVIDER_SITE_OTHER): Payer: BLUE CROSS/BLUE SHIELD | Admitting: Cardiovascular Disease

## 2015-04-27 VITALS — BP 120/80 | HR 92 | Ht 68.0 in | Wt 227.2 lb

## 2015-04-27 DIAGNOSIS — I4891 Unspecified atrial fibrillation: Secondary | ICD-10-CM

## 2015-04-27 MED ORDER — FLECAINIDE ACETATE 50 MG PO TABS: 50.0000 mg | ORAL_TABLET | Freq: Two times a day (BID) | ORAL | Status: DC

## 2015-04-27 MED ORDER — APIXABAN 5 MG PO TABS: 5.0000 mg | ORAL_TABLET | Freq: Two times a day (BID) | ORAL | Status: DC

## 2015-04-27 NOTE — Patient Instructions (Addendum)
Medication Instructions:  Your physician has recommended you make the following change in your medication:  START taking flecainide 50mg  twice per day START taking eliquis 5mg  twice per day STOP taking aspirin   Labwork: Your physician recommends that you return for lab work in 3 weeks: BMET, CBC, PT/INR    Testing/Procedures: Your physician has recommended that you have a Cardioversion (DCCV). Electrical Cardioversion uses a jolt of electricity to your heart either through paddles or wired patches attached to your chest. This is a controlled, usually prescheduled, procedure. Defibrillation is done under light anesthesia in the hospital, and you usually go home the day of the procedure. This is done to get your heart back into a normal rhythm. You are not awake for the procedure. Please see the instruction sheet given to you today.  You are scheduled for a Cardioversion on  September 30, 7:30 with Dr. Kirke Corin [facility] at [facility] at 6:30 a.m. on the day of your procedure.   DIET INSTRUCTIONS:  Nothing to eat or drink after midnight except your medications with a sip of water.         1) Labs: CBC, BMET, PT/INR  2) Medications:  YOU MAY TAKE MORNING MEDS EXCEPT METOPROLOL  with a small amount of water.  3) Must have a responsible person to drive you home.  4) Bring a current list of your medications and current insurance cards.    If you have any questions after you get home, please call the office at [phone]    Follow-Up: Your physician recommends that you schedule a follow-up appointment in: six weeks with Dr. Kirke Corin.   Any Other Special Instructions Will Be Listed Below (If Applicable). You will need to come in for an EKG Sept 29  Electrical Cardioversion Electrical cardioversion is the delivery of a jolt of electricity to change the rhythm of the heart. Sticky patches or metal paddles are placed on the chest to deliver the electricity from a device.  This is done to restore a normal rhythm. A rhythm that is too fast or not regular keeps the heart from pumping well. Electrical cardioversion is done in an emergency if:   There is low or no blood pressure as a result of the heart rhythm.   Normal rhythm must be restored as fast as possible to protect the brain and heart from further damage.   It may save a life. Cardioversion may be done for heart rhythms that are not immediately life threatening, such as atrial fibrillation or flutter, in which:   The heart is beating too fast or is not regular.   Medicine to change the rhythm has not worked.   It is safe to wait in order to allow time for preparation.  Symptoms of the abnormal rhythm are bothersome.  The risk of stroke and other serious problems can be reduced. LET [facility] CARE PROVIDER KNOW ABOUT:   Any allergies you have.  All medicines you are taking, including vitamins, herbs, eye drops, creams, and over-the-counter medicines.  Previous problems you or members of your family have had with the use of anesthetics.   Any blood disorders you have.   Previous surgeries you have had.   Medical conditions you have. RISKS AND COMPLICATIONS  Generally, this is a safe procedure. However, problems can occur and include:   Breathing problems related to the anesthetic used.  A blood clot that breaks free and travels to other parts of your body.  This could cause a stroke or other problems. The risk of this is lowered by use of blood-thinning medicine (anticoagulant) prior to the procedure.  Cardiac arrest (rare). BEFORE THE PROCEDURE   You may have tests to detect blood clots in your heart and to evaluate heart function.  You may start taking anticoagulants so your blood does not clot as easily.   Medicines may be given to help stabilize your heart rate and rhythm. PROCEDURE  You will be given medicine through an IV tube to reduce discomfort and make you sleepy  (sedative).   An electrical shock will be delivered. AFTER THE PROCEDURE Your heart rhythm will be watched to make sure it does not change.  Document Released: 08/05/2002 Document Revised: 12/30/2013 Document Reviewed: 02/27/2013 [facility] Patient Information 2015 [place], [place]. This information is not intended to replace advice given to you by your health care provider. Make sure you discuss any questions you have with your health care provider.
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
## 2015-04-27 NOTE — Assessment & Plan Note (Signed)
I suspect that her exertional palpitations and shortness of breath is likely due to suboptimal control of atrial fibrillation. CHADS VASc score is 1. Aspirin or anticoagulation can be considered. However, I favor anticoagulation and this was discussed with her today. In terms of A. fib management, I think we have 2 options. Either we try to restore sinus rhythm with antiarrhythmics medication or continue with rate control. I think it's reasonable to try to restore normal sinus rhythm and if not successful then can pursue a rate control strategy. I resumed anticoagulation with Eliquis and discontinued aspirin. I started flecainide 50 mg twice daily with anticipation of cardioversion in one month. Continue metoprolol 50 mg twice daily. If rhythm control is not successful, then the plan would be to either increase metoprolol or add digoxin.

## 2015-05-05 ENCOUNTER — Telehealth: Payer: Self-pay

## 2015-05-05 NOTE — Telephone Encounter (Signed)
Pt called and cancelled her lab appt, and f/u appt, also states she wants to cx her cardioversion. States she was taking Flecainide 50 mg, but stopped taking several days ago. States she felt swollen, was having a hard time breathing, states she was itchy, was having a hard time breathing, and sleeping.  States when she quit taking it, she "felt like herself again" Please call.

## 2015-05-06 NOTE — Telephone Encounter (Signed)
S/w Darel Hong in scheduling to cancel cardioversion

## 2015-05-06 NOTE — Telephone Encounter (Signed)
S/w pt who states she felt bad when she started taking flecanide, she was having trouble sleeping and breathing and felt like "I was drugged the whole time. My heart beat fast and it just wasn't working for me. I felt itchy and swollen" After stopped taking it, she reports feeling back to normal. Has stopped taking eliquis. Resumed  aspirin Wants to lose weight and thinks that will help her.  Reports SOB with exertion which she states is nothing new. Wants to cancel labs, cardioversion and followup appt as she states she has had two cardioversions and neither has worked. Advised pt to monitor and report symptoms Forward to [place].

## 2015-05-07 NOTE — Telephone Encounter (Signed)
Ok that's fine. Some people do not tolerate Flecanide. We can continue with rate control. It would be very helpful if she can monitor her heart rate especially with activities.

## 2015-05-07 NOTE — Telephone Encounter (Signed)
Left detailed message regarding recommendations and CB number on pt home VM

## 2015-05-21 ENCOUNTER — Other Ambulatory Visit: Payer: BLUE CROSS/BLUE SHIELD

## 2015-05-25 DIAGNOSIS — K219 Gastro-esophageal reflux disease without esophagitis: Secondary | ICD-10-CM | POA: Diagnosis present

## 2015-05-25 DIAGNOSIS — E538 Deficiency of other specified B group vitamins: Secondary | ICD-10-CM | POA: Insufficient documentation

## 2015-05-29 ENCOUNTER — Ambulatory Visit: Admit: 2015-05-29 | Payer: BLUE CROSS/BLUE SHIELD | Admitting: Cardiovascular Disease

## 2015-05-29 SURGERY — CARDIOVERSION (CATH LAB)
Anesthesia: General

## 2015-06-18 ENCOUNTER — Ambulatory Visit: Payer: BLUE CROSS/BLUE SHIELD | Admitting: Cardiovascular Disease

## 2015-07-01 DIAGNOSIS — E782 Mixed hyperlipidemia: Secondary | ICD-10-CM | POA: Insufficient documentation

## 2015-07-28 DIAGNOSIS — R7989 Other specified abnormal findings of blood chemistry: Secondary | ICD-10-CM | POA: Insufficient documentation

## 2015-09-15 DIAGNOSIS — H9193 Unspecified hearing loss, bilateral: Secondary | ICD-10-CM | POA: Insufficient documentation

## 2015-09-30 ENCOUNTER — Other Ambulatory Visit: Payer: Self-pay | Admitting: *Deleted

## 2015-09-30 MED ORDER — METOPROLOL TARTRATE 50 MG PO TABS: 50.0000 mg | ORAL_TABLET | Freq: Two times a day (BID) | ORAL | Status: DC

## 2015-09-30 NOTE — Telephone Encounter (Signed)
Requested Prescriptions   Signed Prescriptions Disp Refills  . metoprolol (LOPRESSOR) 50 MG tablet 180 tablet 3    Sig: Take 1 tablet (50 mg total) by mouth 2 (two) times daily.    Authorizing Provider: ARIDA, MUHAMMAD A    Ordering User: Ralpheal Zappone C    

## 2015-10-29 DIAGNOSIS — G4733 Obstructive sleep apnea (adult) (pediatric): Secondary | ICD-10-CM | POA: Insufficient documentation

## 2015-11-03 DIAGNOSIS — E039 Hypothyroidism, unspecified: Secondary | ICD-10-CM | POA: Diagnosis present

## 2016-01-11 ENCOUNTER — Other Ambulatory Visit: Payer: Self-pay | Admitting: Gastroenterology

## 2016-07-11 ENCOUNTER — Other Ambulatory Visit: Payer: Self-pay | Admitting: Family Medicine

## 2016-07-11 ENCOUNTER — Other Ambulatory Visit: Payer: Self-pay | Admitting: Pediatrics

## 2016-07-11 DIAGNOSIS — Z1231 Encounter for screening mammogram for malignant neoplasm of breast: Secondary | ICD-10-CM

## 2016-08-16 ENCOUNTER — Ambulatory Visit
Admission: RE | Admit: 2016-08-16 | Discharge: 2016-08-16 | Disposition: A | Discharge status: Home / Self Care | Payer: BLUE CROSS/BLUE SHIELD | Source: Ambulatory Visit | Attending: Pediatrics | Admitting: Pediatrics

## 2016-08-16 DIAGNOSIS — Z1231 Encounter for screening mammogram for malignant neoplasm of breast: Secondary | ICD-10-CM | POA: Insufficient documentation

## 2016-08-16 DIAGNOSIS — R928 Other abnormal and inconclusive findings on diagnostic imaging of breast: Secondary | ICD-10-CM | POA: Diagnosis not present

## 2016-08-23 ENCOUNTER — Other Ambulatory Visit: Payer: Self-pay | Admitting: Pediatrics

## 2016-08-23 DIAGNOSIS — N6310 Unspecified lump in the right breast, unspecified quadrant: Secondary | ICD-10-CM

## 2016-08-23 DIAGNOSIS — R928 Other abnormal and inconclusive findings on diagnostic imaging of breast: Secondary | ICD-10-CM

## 2016-08-30 ENCOUNTER — Telehealth: Payer: Self-pay | Admitting: Cardiovascular Disease

## 2016-08-30 NOTE — Telephone Encounter (Signed)
Received faxed request from [facility], [place], for 90-day metoprolol refill. Informed pharmacy pt last seen here Aug 2016.  [facility]

## 2016-09-05 ENCOUNTER — Ambulatory Visit
Admission: RE | Admit: 2016-09-05 | Discharge: 2016-09-05 | Disposition: A | Discharge status: Home / Self Care | Payer: BLUE CROSS/BLUE SHIELD | Source: Ambulatory Visit | Attending: Pediatrics | Admitting: Pediatrics

## 2016-09-05 DIAGNOSIS — R928 Other abnormal and inconclusive findings on diagnostic imaging of breast: Secondary | ICD-10-CM

## 2016-09-05 DIAGNOSIS — N6001 Solitary cyst of right breast: Secondary | ICD-10-CM | POA: Diagnosis not present

## 2016-09-05 DIAGNOSIS — N6310 Unspecified lump in the right breast, unspecified quadrant: Secondary | ICD-10-CM

## 2017-01-06 ENCOUNTER — Other Ambulatory Visit: Payer: Self-pay | Admitting: Gastroenterology

## 2017-01-15 ENCOUNTER — Other Ambulatory Visit: Payer: Self-pay | Admitting: Gastroenterology

## 2017-10-19 ENCOUNTER — Other Ambulatory Visit: Payer: Self-pay | Admitting: Pediatrics

## 2017-10-19 DIAGNOSIS — Z1231 Encounter for screening mammogram for malignant neoplasm of breast: Secondary | ICD-10-CM

## 2017-12-13 ENCOUNTER — Ambulatory Visit
Admission: RE | Admit: 2017-12-13 | Discharge: 2017-12-13 | Disposition: A | Discharge status: Home / Self Care | Payer: BLUE CROSS/BLUE SHIELD | Source: Ambulatory Visit | Attending: Pediatrics | Admitting: Pediatrics

## 2017-12-13 DIAGNOSIS — Z1231 Encounter for screening mammogram for malignant neoplasm of breast: Secondary | ICD-10-CM | POA: Diagnosis not present

## 2018-07-13 IMAGING — US US BREAST*R* LIMITED INC AXILLA
1 series · 7 of 7 positions shown · non-contrast
Comparison: 08/16/2016 and earlier

CLINICAL DATA: Patient returns after screening study for evaluation
of a possible right breast mass.

EXAM:
2D DIGITAL DIAGNOSTIC RIGHT MAMMOGRAM WITH CAD AND ADJUNCT TOMO
ULTRASOUND RIGHT BREAST

[Series 1: us breast*right* limited inc axilla · 0.06mm/px · 7 of 7 slices shown]
[im 1/7]
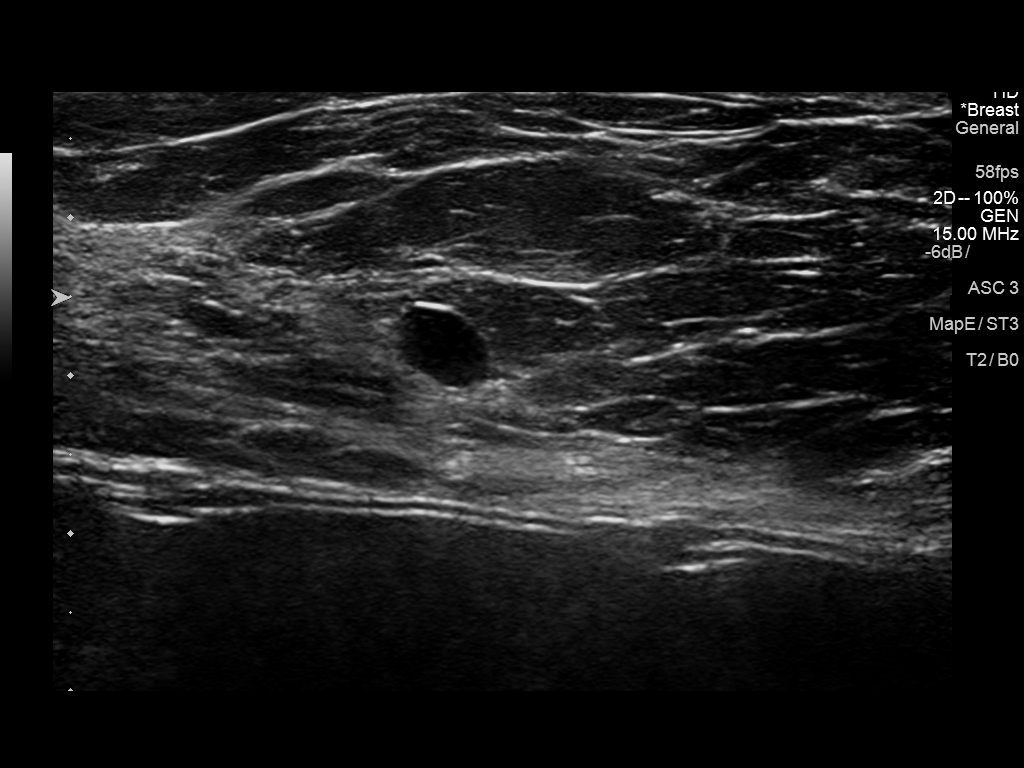
[im 2/7]
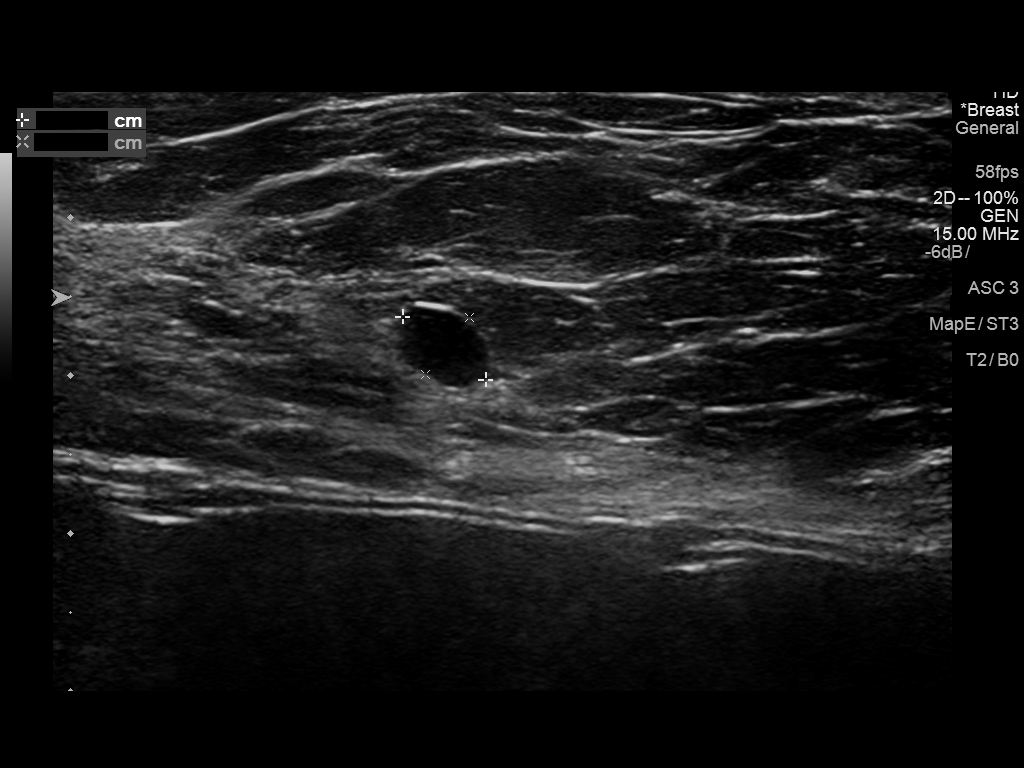
[im 3/7]
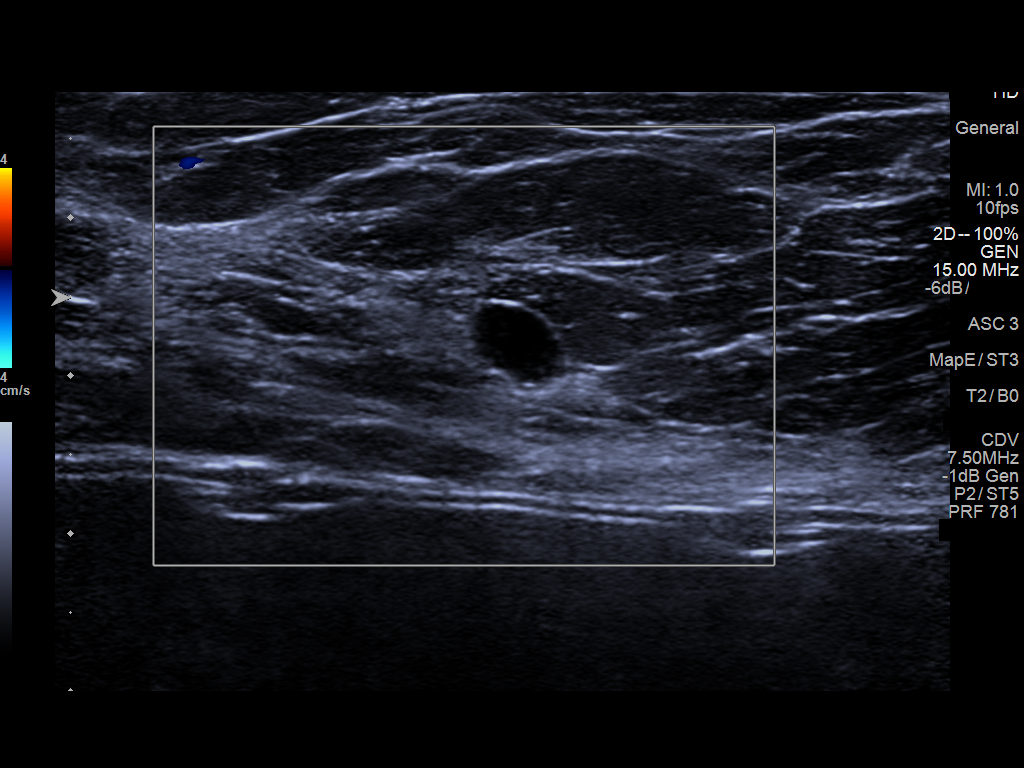
[im 4/7]
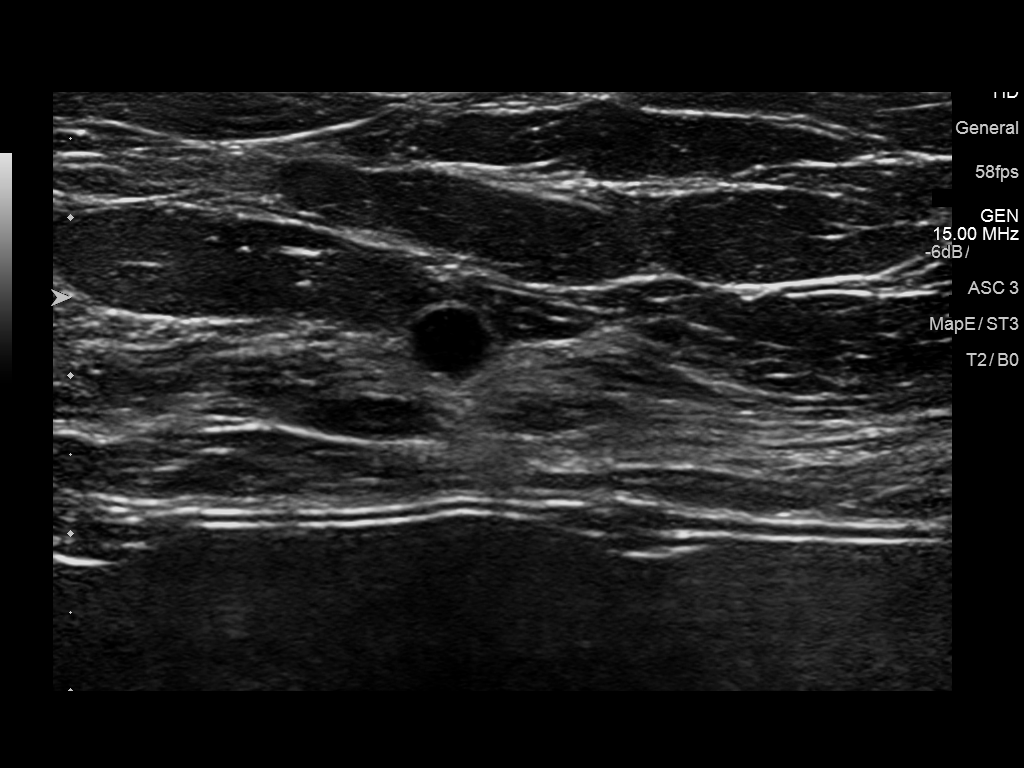
[im 5/7]
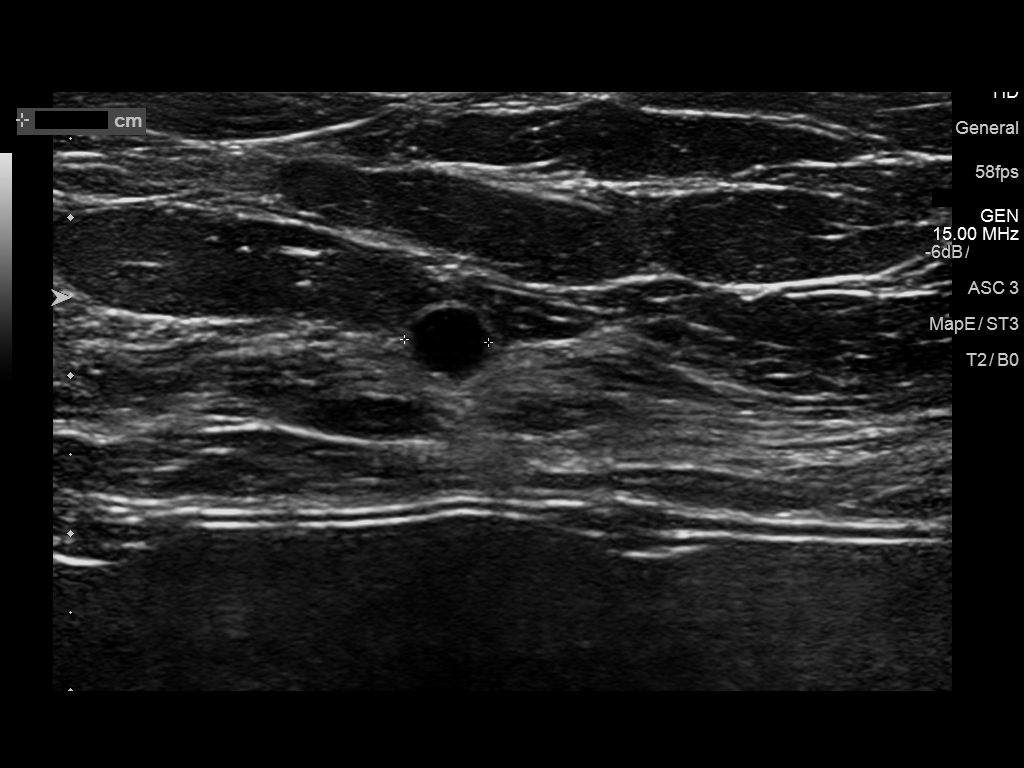
[im 6/7]
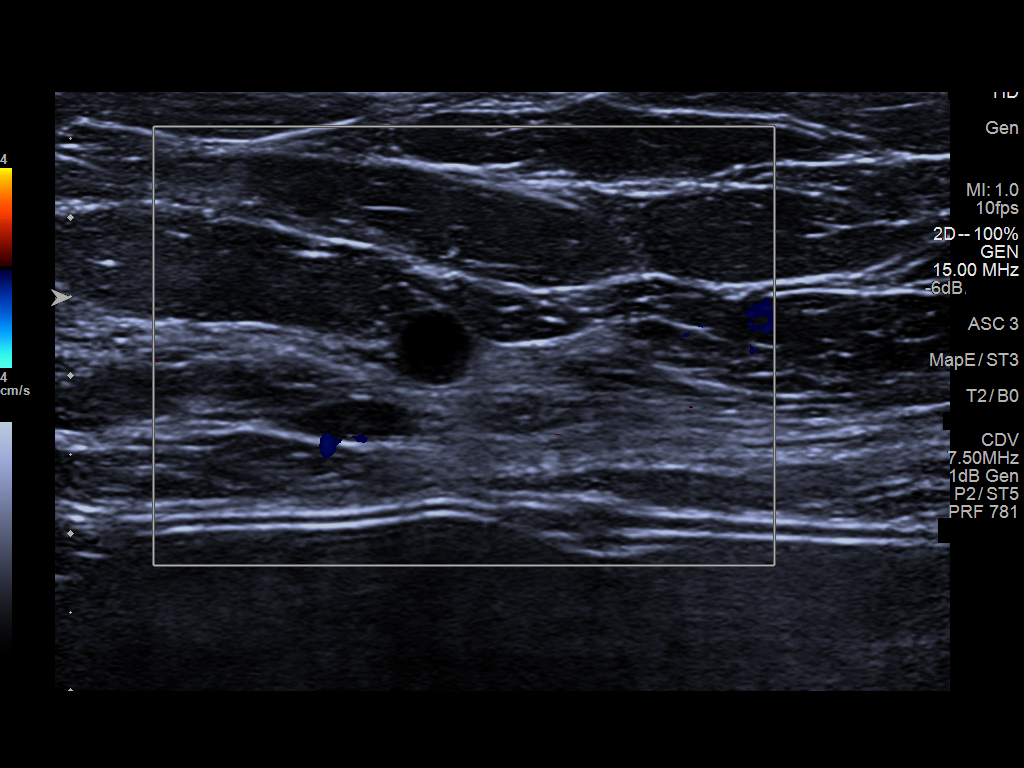
[im 7/7]
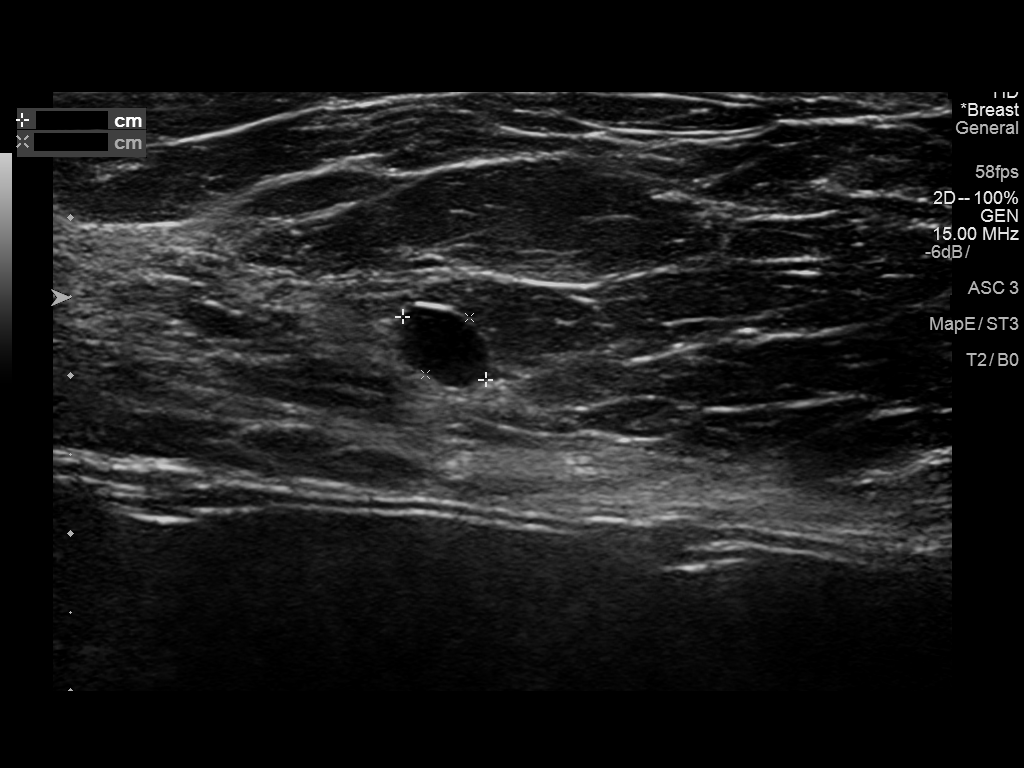

[7 of 7 positions shown; findings below may reference images not displayed]

ACR Breast Density Category b: There are scattered areas of
fibroglandular density.
FINDINGS: Additional views are performed which demonstrate a persistent
rounded partially obscured mass in the upper-outer quadrant of the
right breast.

Mammographic images were processed with CAD.

Targeted ultrasound is performed, showing a simple cysts in the 10
o'clock location of the right breast 3 cm from nipple which measures
0.7 x 0.5 x 0.5 cm. No solid mass or areas of acoustic shadowing are
identified.
IMPRESSION: Persistent abnormality is a simple cyst by ultrasound. No
mammographic or ultrasound evidence for malignancy.

RECOMMENDATION:
Screening mammogram in one year.(Code:3A-[id number])

I have discussed the findings and recommendations with the patient.
Results were also provided in writing at the conclusion of the
visit. If applicable, a reminder letter will be sent to the patient
regarding the next appointment.

BI-RADS CATEGORY  2: Benign.

## 2019-10-06 ENCOUNTER — Inpatient Hospital Stay
Admission: EM | Admit: 2019-10-06 | Discharge: 2019-10-10 | DRG: 177 | Disposition: A | Discharge status: Home / Self Care | Payer: BC Managed Care – PPO | Attending: Internal Medicine | Admitting: Internal Medicine

## 2019-10-06 ENCOUNTER — Other Ambulatory Visit: Payer: Self-pay

## 2019-10-06 ENCOUNTER — Emergency Department: Payer: BC Managed Care – PPO

## 2019-10-06 DIAGNOSIS — Z79890 Hormone replacement therapy (postmenopausal): Secondary | ICD-10-CM

## 2019-10-06 DIAGNOSIS — J9601 Acute respiratory failure with hypoxia: Secondary | ICD-10-CM | POA: Diagnosis present

## 2019-10-06 DIAGNOSIS — R000 Tachycardia, unspecified: Secondary | ICD-10-CM | POA: Diagnosis not present

## 2019-10-06 DIAGNOSIS — Z9981 Dependence on supplemental oxygen: Secondary | ICD-10-CM

## 2019-10-06 DIAGNOSIS — K219 Gastro-esophageal reflux disease without esophagitis: Secondary | ICD-10-CM | POA: Diagnosis present

## 2019-10-06 DIAGNOSIS — Z7901 Long term (current) use of anticoagulants: Secondary | ICD-10-CM | POA: Diagnosis not present

## 2019-10-06 DIAGNOSIS — Z888 Allergy status to other drugs, medicaments and biological substances status: Secondary | ICD-10-CM

## 2019-10-06 DIAGNOSIS — F419 Anxiety disorder, unspecified: Secondary | ICD-10-CM | POA: Diagnosis present

## 2019-10-06 DIAGNOSIS — R0902 Hypoxemia: Secondary | ICD-10-CM

## 2019-10-06 DIAGNOSIS — I480 Paroxysmal atrial fibrillation: Secondary | ICD-10-CM | POA: Diagnosis present

## 2019-10-06 DIAGNOSIS — E669 Obesity, unspecified: Secondary | ICD-10-CM | POA: Diagnosis present

## 2019-10-06 DIAGNOSIS — A0839 Other viral enteritis: Secondary | ICD-10-CM | POA: Diagnosis present

## 2019-10-06 DIAGNOSIS — R946 Abnormal results of thyroid function studies: Secondary | ICD-10-CM | POA: Diagnosis present

## 2019-10-06 DIAGNOSIS — U071 COVID-19: Principal | ICD-10-CM | POA: Diagnosis present

## 2019-10-06 DIAGNOSIS — I4891 Unspecified atrial fibrillation: Secondary | ICD-10-CM

## 2019-10-06 DIAGNOSIS — Z79899 Other long term (current) drug therapy: Secondary | ICD-10-CM

## 2019-10-06 DIAGNOSIS — I10 Essential (primary) hypertension: Secondary | ICD-10-CM | POA: Diagnosis present

## 2019-10-06 DIAGNOSIS — F329 Major depressive disorder, single episode, unspecified: Secondary | ICD-10-CM | POA: Diagnosis present

## 2019-10-06 DIAGNOSIS — Z9114 Patient's other noncompliance with medication regimen: Secondary | ICD-10-CM

## 2019-10-06 DIAGNOSIS — E039 Hypothyroidism, unspecified: Secondary | ICD-10-CM | POA: Diagnosis present

## 2019-10-06 DIAGNOSIS — Z8249 Family history of ischemic heart disease and other diseases of the circulatory system: Secondary | ICD-10-CM | POA: Diagnosis not present

## 2019-10-06 DIAGNOSIS — Z6831 Body mass index (BMI) 31.0-31.9, adult: Secondary | ICD-10-CM | POA: Diagnosis not present

## 2019-10-06 DIAGNOSIS — Z8261 Family history of arthritis: Secondary | ICD-10-CM | POA: Diagnosis not present

## 2019-10-06 DIAGNOSIS — Z87891 Personal history of nicotine dependence: Secondary | ICD-10-CM | POA: Diagnosis not present

## 2019-10-06 DIAGNOSIS — J1282 Pneumonia due to coronavirus disease 2019: Secondary | ICD-10-CM | POA: Diagnosis present

## 2019-10-06 DIAGNOSIS — I482 Chronic atrial fibrillation, unspecified: Secondary | ICD-10-CM | POA: Diagnosis present

## 2019-10-06 LAB — CBC WITH DIFFERENTIAL/PLATELET
Abs Immature Granulocytes: 0.01 10*3/uL (ref 0.00–0.07)
Basophils Absolute: 0 10*3/uL (ref 0.0–0.1)
Basophils Relative: 0 %
Eosinophils Absolute: 0 10*3/uL (ref 0.0–0.5)
Eosinophils Relative: 0 %
HCT: 42.1 % (ref 36.0–46.0)
Hemoglobin: 14 g/dL (ref 12.0–15.0)
Immature Granulocytes: 0 %
Lymphocytes Relative: 20 %
Lymphs Abs: 0.9 10*3/uL (ref 0.7–4.0)
MCH: 29.7 pg (ref 26.0–34.0)
MCHC: 33.3 g/dL (ref 30.0–36.0)
MCV: 89.4 fL (ref 80.0–100.0)
Monocytes Absolute: 0.4 10*3/uL (ref 0.1–1.0)
Monocytes Relative: 8 %
Neutro Abs: 3.3 10*3/uL (ref 1.7–7.7)
Neutrophils Relative %: 72 %
Platelets: 160 10*3/uL (ref 150–400)
RBC: 4.71 MIL/uL (ref 3.87–5.11)
RDW: 13 % (ref 11.5–15.5)
WBC: 4.7 10*3/uL (ref 4.0–10.5)
nRBC: 0 % (ref 0.0–0.2)

## 2019-10-06 LAB — COMPREHENSIVE METABOLIC PANEL
ALT: 19 U/L (ref 0–44)
AST: 29 U/L (ref 15–41)
Albumin: 4 g/dL (ref 3.5–5.0)
Alkaline Phosphatase: 54 U/L (ref 38–126)
Anion gap: 11 (ref 5–15)
BUN: 14 mg/dL (ref 8–23)
CO2: 21 mmol/L — ABNORMAL LOW (ref 22–32)
Calcium: 8.9 mg/dL (ref 8.9–10.3)
Chloride: 101 mmol/L (ref 98–111)
Creatinine, Ser: 0.9 mg/dL (ref 0.44–1.00)
GFR calc Af Amer: >60 mL/min (ref >60)
GFR calc non Af Amer: >60 mL/min (ref >60)
Glucose, Bld: 123 mg/dL — ABNORMAL HIGH (ref 70–99)
Potassium: 4.3 mmol/L (ref 3.5–5.1)
Sodium: 133 mmol/L — ABNORMAL LOW (ref 135–145)
Total Bilirubin: 0.7 mg/dL (ref 0.3–1.2)
Total Protein: 7.4 g/dL (ref 6.5–8.1)

## 2019-10-06 LAB — POC SARS CORONAVIRUS 2 AG: SARS Coronavirus 2 Ag: NEGATIVE

## 2019-10-06 LAB — FERRITIN
Ferritin: 175 ng/mL (ref 11–307)
Ferritin: 202 ng/mL (ref 11–307)

## 2019-10-06 LAB — RESPIRATORY PANEL BY RT PCR (FLU A&B, COVID)
Influenza A by PCR: NEGATIVE
Influenza B by PCR: NEGATIVE
SARS Coronavirus 2 by RT PCR: POSITIVE — AB

## 2019-10-06 LAB — PROCALCITONIN: Procalcitonin: <0.1 ng/mL

## 2019-10-06 LAB — FIBRINOGEN
Fibrinogen: 435 mg/dL (ref 210–475)
Fibrinogen: 556 mg/dL — ABNORMAL HIGH (ref 210–475)

## 2019-10-06 LAB — TYPE AND SCREEN
ABO/RH(D): O NEG
Antibody Screen: NEGATIVE

## 2019-10-06 LAB — BRAIN NATRIURETIC PEPTIDE
B Natriuretic Peptide: 106 pg/mL — ABNORMAL HIGH (ref 0.0–100.0)
B Natriuretic Peptide: 105 pg/mL — ABNORMAL HIGH (ref 0.0–100.0)

## 2019-10-06 LAB — TSH: TSH: 12.015 u[IU]/mL — ABNORMAL HIGH (ref 0.350–4.500)

## 2019-10-06 LAB — TROPONIN I (HIGH SENSITIVITY)
Troponin I (High Sensitivity): 26 ng/L — ABNORMAL HIGH (ref <18)
Troponin I (High Sensitivity): 26 ng/L — ABNORMAL HIGH (ref <18)

## 2019-10-06 LAB — T4, FREE: Free T4: 0.71 ng/dL (ref 0.61–1.12)

## 2019-10-06 LAB — C-REACTIVE PROTEIN: CRP: 3.3 mg/dL — ABNORMAL HIGH (ref <1.0)

## 2019-10-06 LAB — FIBRIN DERIVATIVES D-DIMER (ARMC ONLY)
Fibrin derivatives D-dimer (ARMC): 1059.13 ng/mL (FEU) — ABNORMAL HIGH (ref 0.00–499.00)
Fibrin derivatives D-dimer (ARMC): 1312.29 ng/mL (FEU) — ABNORMAL HIGH (ref 0.00–499.00)

## 2019-10-06 LAB — TRIGLYCERIDES: Triglycerides: 64 mg/dL (ref <150)

## 2019-10-06 LAB — ABO/RH: ABO/RH(D): O NEG

## 2019-10-06 MED ORDER — ZINC SULFATE 220 (50 ZN) MG PO CAPS
220.0000 mg | ORAL_CAPSULE | Freq: Every day | ORAL | Status: DC
  Administered 2019-10-06 – 2019-10-10 (×5): 220 mg via ORAL
  Filled 2019-10-06 (×5): qty 1

## 2019-10-06 MED ORDER — DEXAMETHASONE SODIUM PHOSPHATE 10 MG/ML IJ SOLN
6.0000 mg | INTRAMUSCULAR | Status: DC
  Administered 2019-10-06 – 2019-10-09 (×4): 6 mg via INTRAVENOUS
  Filled 2019-10-06 (×4): qty 1

## 2019-10-06 MED ORDER — SODIUM CHLORIDE 0.9 % IV BOLUS
1000.0000 mL | Freq: Once | INTRAVENOUS | Status: AC
  Administered 2019-10-06: 1000 mL via INTRAVENOUS

## 2019-10-06 MED ORDER — SODIUM CHLORIDE 0.9 % IV SOLN
100.0000 mg | Freq: Every day | INTRAVENOUS | Status: AC
  Administered 2019-10-07 – 2019-10-10 (×4): 100 mg via INTRAVENOUS
  Filled 2019-10-06 (×5): qty 20

## 2019-10-06 MED ORDER — ASCORBIC ACID 500 MG PO TABS
500.0000 mg | ORAL_TABLET | Freq: Every day | ORAL | Status: DC
  Administered 2019-10-06 – 2019-10-10 (×5): 500 mg via ORAL
  Filled 2019-10-06 (×5): qty 1

## 2019-10-06 MED ORDER — DEXAMETHASONE SODIUM PHOSPHATE 10 MG/ML IJ SOLN
6.0000 mg | Freq: Once | INTRAMUSCULAR | Status: AC
  Administered 2019-10-06: 6 mg via INTRAVENOUS
  Filled 2019-10-06: qty 1

## 2019-10-06 MED ORDER — ONDANSETRON HCL 4 MG/2ML IJ SOLN
4.0000 mg | Freq: Once | INTRAMUSCULAR | Status: AC
  Administered 2019-10-06: 4 mg via INTRAVENOUS
  Filled 2019-10-06: qty 2

## 2019-10-06 MED ORDER — HEPARIN SODIUM (PORCINE) 5000 UNIT/ML IJ SOLN
5000.0000 [IU] | Freq: Three times a day (TID) | INTRAMUSCULAR | Status: DC
  Administered 2019-10-06 – 2019-10-08 (×6): 5000 [IU] via SUBCUTANEOUS
  Filled 2019-10-06 (×6): qty 1

## 2019-10-06 MED ORDER — SODIUM CHLORIDE 0.9 % IV SOLN
200.0000 mg | Freq: Once | INTRAVENOUS | Status: AC
  Administered 2019-10-06: 200 mg via INTRAVENOUS
  Filled 2019-10-06: qty 200

## 2019-10-06 NOTE — Progress Notes (Signed)
Eight beat of V-tach, when ambulating to Bathroom.

## 2019-10-06 NOTE — ED Provider Notes (Signed)
[facility] Emergency Department Provider Note ____________________________________________   First MD Initiated Contact with Patient 10/06/19 1007     (approximate)  I have reviewed the triage vital signs and the nursing notes.   HISTORY  Chief Complaint Cough    HPI Molly Brown is a 69 y.o. female with PMH as noted below who presents with concern for symptoms due to possible COVID-19, primarily consisting of vomiting and diarrhea, persistent course over the last 10 days, and associated with fever and chills, body aches, and generalized weakness and malaise.  The patient has had a mild cough but denies significant shortness of breath.  Her husband is positive for COVID-19 but the patient has not been tested.  Past Medical History:  Diagnosis Date   AF (atrial fibrillation) (HCC)    Anal fissure    Anxiety    Cardiac arrhythmia due to congenital heart disease    Chicken pox    Depression    GERD (gastroesophageal reflux disease)    Heart murmur     Patient Active Problem List   Diagnosis Date Noted   Exertional dyspnea 06/05/2014   Routine general medical examination at a health care facility 02/24/2014   Encounter for routine gynecological examination 02/24/2014   Atrial fibrillation (HCC) 02/07/2014   Dysphagia, unspecified(787.20) 01/23/2014   Other malaise and fatigue 01/23/2014    Past Surgical History:  Procedure Laterality Date   ABDOMINAL HYSTERECTOMY     AUGMENTATION MAMMAPLASTY Bilateral 1999   BREAST ENHANCEMENT SURGERY  2000   TONSILLECTOMY  1972   TOOTH EXTRACTION      Prior to Admission medications   Medication Sig Start Date End Date Taking? Authorizing Provider  acetaminophen (TYLENOL) 325 MG tablet Take 650 mg by mouth every 6 (six) hours as needed.    [provider]  apixaban (ELIQUIS) 5 MG TABS tablet Take 1 tablet (5 mg total) by mouth 2 (two) times daily. Patient not taking: Reported on  10/06/2019 04/27/15   Iran Ouch, MD  cyanocobalamin 500 MCG tablet Take 500 mcg by mouth daily.    [provider]  flecainide (TAMBOCOR) 50 MG tablet Take 1 tablet (50 mg total) by mouth 2 (two) times daily. Patient not taking: Reported on 10/06/2019 04/27/15   Iran Ouch, MD  metoprolol (LOPRESSOR) 50 MG tablet Take 1 tablet (50 mg total) by mouth 2 (two) times daily. Patient not taking: Reported on 10/06/2019 09/30/15   Iran Ouch, MD  Multiple Vitamin (MULTIVITAMIN) LIQD Take 5 mLs by mouth daily.    [provider]  omeprazole (PRILOSEC) 40 MG capsule TAKE 1 CAPSULE (40 MG TOTAL) BY MOUTH DAILY. Patient not taking: Reported on 10/06/2019 01/11/16   Rachael Fee, MD  polyethylene glycol [facility] Ethelene Hal) packet Take 17 g by mouth as needed.    [provider]  Probiotic Product (PROBIOTIC DAILY) CAPS Take by mouth.    [provider]    Allergies Eszopiclone and Flecainide  Family History  Problem Relation Age of Onset   Arthritis Mother    Heart disease Father    Arthritis Maternal Grandmother    Arthritis Paternal Grandmother    Heart disease Paternal Grandmother    Breast cancer Neg Hx     Social History Social History   Tobacco Use   Smoking status: Former Smoker    Types: Cigarettes   Smokeless tobacco: Never Used  Substance Use Topics   Alcohol use: No   Drug use:  No    Review of Systems  Constitutional: Positive for fever/chills. Eyes: No visual changes. ENT: No sore throat. Cardiovascular: Denies chest pain. Respiratory: Denies shortness of breath. Gastrointestinal: Positive for vomiting and diarrhea.  Genitourinary: Negative for dysuria.  Musculoskeletal: Negative for back pain. Skin: Negative for rash. Neurological: Negative for headaches, focal weakness or numbness.   ____________________________________________   PHYSICAL EXAM:  VITAL SIGNS: ED Triage Vitals  Enc Vitals Group      BP 10/06/19 0959 100/80     Pulse Rate 10/06/19 0959 96     Resp 10/06/19 0959 18     Temp 10/06/19 0959 99.6 F (37.6 C)     Temp src --      SpO2 10/06/19 0959 94 %     Weight 10/06/19 0955 208 lb (94.3 kg)     Height 10/06/19 0955 5\' 8"  (1.727 m)     Head Circumference --      Peak Flow --      Pain Score 10/06/19 0955 0     Pain Loc --      Pain Edu? --      Excl. in [place]? --     Constitutional: Alert and oriented.  Relatively well appearing and in no acute distress. Eyes: Conjunctivae are normal.  EOMI. Head: Atraumatic. Nose: No congestion/rhinnorhea. Mouth/Throat: Mucous membranes are moist.   Neck: Normal range of motion.  Cardiovascular: Normal rate, regular rhythm.  Good peripheral circulation. Respiratory: Normal respiratory effort.  No retractions.  Gastrointestinal:  No distention.  Musculoskeletal: Extremities warm and well perfused.  Neurologic:  Normal speech and language. No gross focal neurologic deficits are appreciated.  Skin:  Skin is warm and dry. No rash noted. Psychiatric: Mood and affect are normal. Speech and behavior are normal.  ____________________________________________   LABS (all labs ordered are listed, but only abnormal results are displayed)  Labs Reviewed  COMPREHENSIVE METABOLIC PANEL - Abnormal; Notable for the following components:      Result Value   Sodium 133 (*)    CO2 21 (*)    Glucose, Bld 123 (*)    All other components within normal limits  BRAIN NATRIURETIC PEPTIDE - Abnormal; Notable for the following components:   B Natriuretic Peptide 106.0 (*)    All other components within normal limits  FIBRIN DERIVATIVES D-DIMER ([facility] ONLY) - Abnormal; Notable for the following components:   Fibrin derivatives D-dimer ([facility]) 1,059.13 (*)    All other components within normal limits  TROPONIN I (HIGH SENSITIVITY) - Abnormal; Notable for the following components:   Troponin I (High Sensitivity) 26 (*)    All other components within  normal limits  TROPONIN I (HIGH SENSITIVITY) - Abnormal; Notable for the following components:   Troponin I (High Sensitivity) 26 (*)    All other components within normal limits  RESPIRATORY PANEL BY RT PCR (FLU A&B, COVID)  CBC WITH DIFFERENTIAL/PLATELET  FIBRINOGEN  FERRITIN  TRIGLYCERIDES  PROCALCITONIN  C-REACTIVE PROTEIN  POC SARS CORONAVIRUS 2 AG -  ED  POC SARS CORONAVIRUS 2 AG   ____________________________________________  EKG   ____________________________________________  RADIOLOGY  CXR: Mild diffuse bilateral patchy opacities  ____________________________________________   PROCEDURES  Procedure(s) performed: No  Procedures  Critical Care performed: No ____________________________________________   INITIAL IMPRESSION / ASSESSMENT AND PLAN / ED COURSE  Pertinent labs & imaging results that were available during my care of the patient were reviewed by me and considered in my medical decision making (see chart for details).  69 year old female with PMH as noted above presents with persistent GI symptoms, as well as generalized weakness malaise, fever and body aches over the last 10 days due to presumed COVID-19.  The patient's husband is positive.  She denies significant respiratory symptoms at this time.  On exam, patient is somewhat tired but overall relatively well-appearing.  O2 saturation is in the mid 90s on room air and her other vital signs are normal.  She has no increased work of breathing or respiratory distress.  The remainder of the exam is unremarkable.  Overall presentation is consistent with symptoms related to COVID-19.  Chest x-ray shows mild bilateral opacities consistent with this.  We will obtain basic labs to evaluate for electrolyte abnormalities and renal function, and hydrate the patient.  If the lab work-up is reassuring, I anticipate that the patient will be stable for discharge  home.  ----------------------------------------- 12:54 PM on 10/06/2019 -----------------------------------------  O2 saturation has had several drops into the 80s.  Therefore we placed the patient on oxygen by nasal cannula and will proceed with admission.  I ordered dexamethasone.  Covid antigen test is negative, however PCR is pending.  ----------------------------------------- 3:19 PM on 10/06/2019 -----------------------------------------  I discussed the case with Dr. Allena Katz for admission to the hospitalist service.  ___________________________  Tanja Port was evaluated in Emergency Department on 10/06/2019 for the symptoms described in the history of present illness. She was evaluated in the context of the global COVID-19 pandemic, which necessitated consideration that the patient might be at risk for infection with the SARS-CoV-2 virus that causes COVID-19. Institutional protocols and algorithms that pertain to the evaluation of patients at risk for COVID-19 are in a state of rapid change based on information released by regulatory bodies including the CDC and federal and state organizations. These policies and algorithms were followed during the patient's care in the ED.  ____________________________________________   FINAL CLINICAL IMPRESSION(S) / ED DIAGNOSES  Final diagnoses:  Hypoxia  COVID-19      NEW MEDICATIONS STARTED DURING THIS VISIT:  New Prescriptions   No medications on file     Note:  This document was prepared using Dragon voice recognition software and may include unintentional dictation errors.    Dionne Bucy, MD 10/06/19 [phone]

## 2019-10-06 NOTE — Assessment & Plan Note (Signed)
Pt started on regimen for covid-19 PNA due to hypoxia and clinical presentation of sob.  Pt on remdesivir/decadron/ and vitamin C and zinc.  Prn respiratory treatment and oxygen for sats above 93%.

## 2019-10-06 NOTE — Assessment & Plan Note (Signed)
Iv ppi and prn nausea meds.

## 2019-10-06 NOTE — H&P (Signed)
History and Physical    Molly Brown FHL:[id number] DOB: 09-21-1950 DOA: 10/06/2019   PCP: Patient, No Pcp Per   Patient coming from: home  Chief Complaint: generalized malaise and sob.   HPI: Molly Brown is a 69 y.o. female with medical history significant of HTN,A.fib on eliquis seen in ed for generalized malaise was  presents with concern for symptoms due to possible COVID-19, primarily consisting of vomiting and diarrhea, persistent course over the last 10 days, and associated with fever and chills, body aches, and generalized weakness and malaise.  The patient has had a mild cough but denies significant shortness of breath.  Her husband is positive for COVID-19 but the patient has not been tested.   ED Course:  Blood pressure 113/77, pulse 100, temperature 99.6 F (37.6 C), resp. rate (!) 23, height 5\' 8"  (1.727 m), weight 94.3 kg, SpO2 96 %.  Review of Systems: As per HPI otherwise 10 point review of systems negative.   Past Medical History:  Diagnosis Date  . AF (atrial fibrillation) (HCC)   . Anal fissure   . Anxiety   . Cardiac arrhythmia due to congenital heart disease   . Chicken pox   . Depression   . GERD (gastroesophageal reflux disease)   . Heart murmur     Past Surgical History:  Procedure Laterality Date  . ABDOMINAL HYSTERECTOMY    . AUGMENTATION MAMMAPLASTY Bilateral 1999  . BREAST ENHANCEMENT SURGERY  2000  . TONSILLECTOMY  1972  . TOOTH EXTRACTION       reports that she has quit smoking. Her smoking use included cigarettes. She has never used smokeless tobacco. She reports that she does not drink alcohol or use drugs.  Allergies  Allergen Reactions  . Eszopiclone Other (See Comments)    Felt really weird  . Flecainide Other (See Comments) and Rash    Insomnia and felt drugged    Family History  Problem Relation Age of Onset  . Arthritis Mother   . Heart disease Father   . Arthritis Maternal Grandmother   . Arthritis Paternal Grandmother     . Heart disease Paternal Grandmother   . Breast cancer Neg Hx     Prior to Admission medications   Medication Sig Start Date End Date Taking? Authorizing Provider  acetaminophen (TYLENOL) 325 MG tablet Take 650 mg by mouth every 6 (six) hours as needed.    [provider]  apixaban (ELIQUIS) 5 MG TABS tablet Take 1 tablet (5 mg total) by mouth 2 (two) times daily. Patient not taking: Reported on 10/06/2019 04/27/15   04/29/15, MD  cyanocobalamin 500 MCG tablet Take 500 mcg by mouth daily.    [provider]  flecainide (TAMBOCOR) 50 MG tablet Take 1 tablet (50 mg total) by mouth 2 (two) times daily. Patient not taking: Reported on 10/06/2019 04/27/15   04/29/15, MD  metoprolol (LOPRESSOR) 50 MG tablet Take 1 tablet (50 mg total) by mouth 2 (two) times daily. Patient not taking: Reported on 10/06/2019 09/30/15   11/28/15, MD  Multiple Vitamin (MULTIVITAMIN) LIQD Take 5 mLs by mouth daily.    [provider]  omeprazole (PRILOSEC) 40 MG capsule TAKE 1 CAPSULE (40 MG TOTAL) BY MOUTH DAILY. Patient not taking: Reported on 10/06/2019 01/11/16   01/13/16, MD  polyethylene glycol [facility]) packet Take 17 g by mouth as needed.    [provider]  Probiotic Product (PROBIOTIC DAILY) CAPS Take by  mouth.    [provider]    Physical Exam: Vitals:   10/06/19 1430 10/06/19 1445 10/06/19 1500 10/06/19 1700  BP: 122/79  125/82 113/77  Pulse: 90 93 100 100  Resp: (!) 21 (!) 23 (!) 23   Temp:      SpO2: 98% 98% 99% 96%  Weight:      Height:          Constitutional: NAD, calm, comfortable Vitals:   10/06/19 1430 10/06/19 1445 10/06/19 1500 10/06/19 1700  BP: 122/79  125/82 113/77  Pulse: 90 93 100 100  Resp: (!) 21 (!) 23 (!) 23   Temp:      SpO2: 98% 98% 99% 96%  Weight:      Height:       Eyes: PERRL, lids and conjunctivae normal ENMT: Mucous membranes are moist. Posterior pharynx clear of any exudate or  lesions.Normal dentition.  Neck: normal, supple, no masses, no thyromegaly Respiratory: clear to auscultation bilaterally, no wheezing, no crackles. Normal respiratory effort. No accessory muscle use.  Cardiovascular: Irregular rate and rhythm,+murmurs  No extremity edema. 2+ pedal pulses. No carotid bruits.  Abdomen: no tenderness, no masses palpated. No hepatosplenomegaly. Bowel sounds positive.  Musculoskeletal: no clubbing / cyanosis. No joint deformity upper and lower extremities. Good ROM, no contractures. Normal muscle tone.  Skin: no rashes, lesions, ulcers. No induration Neurologic: CN 2-12 grossly intact. Sensation intact, DTR normal. Strength 5/5 in all 4.  Psychiatric: Normal judgment and insight. Alert and oriented x 3. Normal mood.  Labs on Admission: I have personally reviewed following labs and imaging studies  CBC: Recent Labs  Lab 10/06/19 1117  WBC 4.7  NEUTROABS 3.3  HGB 14.0  HCT 42.1  MCV 89.4  PLT 160   Basic Metabolic Panel: Recent Labs  Lab 10/06/19 1117  NA 133*  K 4.3  CL 101  CO2 21*  GLUCOSE 123*  BUN 14  CREATININE 0.90  CALCIUM 8.9   GFR: Estimated Creatinine Clearance: 73.9 mL/min (by C-G formula based on SCr of 0.9 mg/dL). Liver Function Tests: Recent Labs  Lab 10/06/19 1117  AST 29  ALT 19  ALKPHOS 54  BILITOT 0.7  PROT 7.4  ALBUMIN 4.0   No results for input(s): LIPASE, AMYLASE in the last 168 hours. No results for input(s): AMMONIA in the last 168 hours. Coagulation Profile: No results for input(s): INR, PROTIME in the last 168 hours. Cardiac Enzymes: No results for input(s): CKTOTAL, CKMB, CKMBINDEX, TROPONINI in the last 168 hours. BNP (last 3 results) No results for input(s): PROBNP in the last 8760 hours. HbA1C: No results for input(s): HGBA1C in the last 72 hours. CBG: No results for input(s): GLUCAP in the last 168 hours. Lipid Profile: Recent Labs    10/06/19 1238  TRIG 64   Thyroid Function Tests: No  results for input(s): TSH, T4TOTAL, FREET4, T3FREE, THYROIDAB in the last 72 hours. Anemia Panel: Recent Labs    10/06/19 1237  FERRITIN 175   Urine analysis: No results found for: COLORURINE, APPEARANCEUR, LABSPEC, PHURINE, GLUCOSEU, HGBUR, BILIRUBINUR, KETONESUR, PROTEINUR, UROBILINOGEN, NITRITE, LEUKOCYTESUR   Recent Results (from the past 240 hour(s))  Respiratory Panel by RT PCR (Flu A&B, Covid) - Nasopharyngeal Swab     Status: Abnormal   Collection Time: 10/06/19  1:52 PM   Specimen: Nasopharyngeal Swab  Result Value Ref Range Status   SARS Coronavirus 2 by RT PCR POSITIVE (A) NEGATIVE Final    Comment: RESULT CALLED TO, READ BACK BY  AND VERIFIED WITH: KATIE FERGUSSON @1532  10/06/19 MJU (NOTE) SARS-CoV-2 target nucleic acids are DETECTED. SARS-CoV-2 RNA is generally detectable in upper respiratory specimens  during the acute phase of infection. Positive results are indicative of the presence of the identified virus, but do not rule out bacterial infection or co-infection with other pathogens not detected by the test. Clinical correlation with patient history and other diagnostic information is necessary to determine patient infection status. The expected result is Negative. Fact Sheet for Patients:  PinkCheek.be Fact Sheet for Healthcare Providers: GravelBags.it This test is not yet approved or cleared by the [place] FDA and  has been authorized for detection and/or diagnosis of SARS-CoV-2 by FDA under an Emergency Use Authorization (EUA).  This EUA will remain in effect (meaning this test can be used) for  the duration of  the COVID-19 declaration under Section 564(b)(1) of the Act, 21 U.S.C. section 360bbb-3(b)(1), unless the authorization is terminated or revoked sooner.    Influenza A by PCR NEGATIVE NEGATIVE Final   Influenza B by PCR NEGATIVE NEGATIVE Final    Comment: (NOTE) The Xpert Xpress  SARS-CoV-2/FLU/RSV assay is intended as an aid in  the diagnosis of influenza from Nasopharyngeal swab specimens and  should not be used as a sole basis for treatment. Nasal washings and  aspirates are unacceptable for Xpert Xpress SARS-CoV-2/FLU/RSV  testing. Fact Sheet for Patients: PinkCheek.be Fact Sheet for Healthcare Providers: GravelBags.it This test is not yet approved or cleared by the [place] FDA and  has been authorized for detection and/or diagnosis of SARS-CoV-2 by  FDA under an Emergency Use Authorization (EUA). This EUA will remain  in effect (meaning this test can be used) for the duration of the  Covid-19 declaration under Section 564(b)(1) of the Act, 21  U.S.C. section 360bbb-3(b)(1), unless the authorization is  terminated or revoked. Performed at [facility], [address] [place], [place] [address]      Radiological Exams on Admission: DG Chest Portable 1 View  Result Date: 10/06/2019 CLINICAL DATA:  Possible COVID EXAM: PORTABLE CHEST 1 VIEW COMPARISON:  None. FINDINGS: The heart size and mediastinal contours are within normal limits. Mild, diffuse interstitial pulmonary opacity. The visualized skeletal structures are unremarkable. IMPRESSION: Mild, diffuse interstitial pulmonary opacity, most consistent with atypical or viral infection, or alternately pulmonary edema. There is no focal airspace opacity. Electronically Signed   By: Eddie Candle M.D.   On: 10/06/2019 10:23    EKG: none  Assessment/Plan Pt admitted to [facility] with telemetry suspect her presentation due to covid-19 pneumonia and we will t/t with antiviral regimen and supportive care.  Chronic atrial fibrillation (HCC) Assessment & Plan Pt is currently on eliquis and flecainide and metoprolol 50 even though she denied using any anticoagulation. We will continue the same.   Pneumonia due to COVID-19 virus Assessment &  Plan Pt started on regimen for covid-19 PNA due to hypoxia and clinical presentation of sob.  Pt on remdesivir/decadron/ and vitamin C and zinc.  Prn respiratory treatment and oxygen for sats above 93%.   GERD (gastroesophageal reflux disease) Assessment & Plan Iv ppi and prn nausea meds.  Gastroenteritis due to COVID-19 virus Assessment & Plan Pt is currently asymptomatic she did have diarrhea initially.   Subclinical hypothyroidism Assessment & Plan We will check her tft's no meds currently.    DVT prophylaxis: heparin Code Status: full  Family Communication: none  Disposition Plan: home  Consults called: none  Admission status: inpatient  Gertha Calkin MD [facility] If 7PM-7AM, please contact night-coverage [email or web address] Password [facility]  10/06/2019, 5:29 PM

## 2019-10-06 NOTE — Assessment & Plan Note (Signed)
Pt is currently asymptomatic she did have diarrhea initially.

## 2019-10-06 NOTE — ED Notes (Signed)
Pt ambulatory to bathroom

## 2019-10-06 NOTE — Assessment & Plan Note (Signed)
Pt is currently on eliquis and flecainide and metoprolol 50 even though she denied using any anticoagulation. We will continue the same.

## 2019-10-06 NOTE — ED Notes (Signed)
Pt placed on 2L [place] d/t desatting in upper 80's. Will continue to monitor.

## 2019-10-06 NOTE — Assessment & Plan Note (Signed)
We will check her tft's no meds currently.

## 2019-10-06 NOTE — ED Triage Notes (Addendum)
Pt arrives [facility] for fever, chills, body aches x 10 days. Husband is COVID +, pt hasn't been tested. A&O, ambulatory. 101.1 with EMS.  Other VSS. HR 110, 98% RA. No Tylenol today, last dose was last night.

## 2019-10-07 ENCOUNTER — Encounter: Payer: Self-pay | Admitting: Internal Medicine

## 2019-10-07 LAB — COMPREHENSIVE METABOLIC PANEL
ALT: 20 U/L (ref 0–44)
AST: 31 U/L (ref 15–41)
Albumin: 3.7 g/dL (ref 3.5–5.0)
Alkaline Phosphatase: 55 U/L (ref 38–126)
Anion gap: 11 (ref 5–15)
BUN: 19 mg/dL (ref 8–23)
CO2: 22 mmol/L (ref 22–32)
Calcium: 9 mg/dL (ref 8.9–10.3)
Chloride: 104 mmol/L (ref 98–111)
Creatinine, Ser: 0.91 mg/dL (ref 0.44–1.00)
GFR calc Af Amer: >60 mL/min (ref >60)
GFR calc non Af Amer: >60 mL/min (ref >60)
Glucose, Bld: 125 mg/dL — ABNORMAL HIGH (ref 70–99)
Potassium: 4.3 mmol/L (ref 3.5–5.1)
Sodium: 137 mmol/L (ref 135–145)
Total Bilirubin: 0.6 mg/dL (ref 0.3–1.2)
Total Protein: 7.5 g/dL (ref 6.5–8.1)

## 2019-10-07 LAB — C-REACTIVE PROTEIN: CRP: 5.6 mg/dL — ABNORMAL HIGH (ref <1.0)

## 2019-10-07 LAB — T4, FREE: Free T4: 0.65 ng/dL (ref 0.61–1.12)

## 2019-10-07 LAB — HIV ANTIBODY (ROUTINE TESTING W REFLEX): HIV Screen 4th Generation wRfx: NONREACTIVE

## 2019-10-07 LAB — FIBRIN DERIVATIVES D-DIMER (ARMC ONLY): Fibrin derivatives D-dimer (ARMC): 1509.88 ng/mL (FEU) — ABNORMAL HIGH (ref 0.00–499.00)

## 2019-10-07 LAB — FERRITIN: Ferritin: 196 ng/mL (ref 11–307)

## 2019-10-07 MED ORDER — ACETAMINOPHEN 325 MG PO TABS: 650.0000 mg | ORAL_TABLET | Freq: Four times a day (QID) | ORAL | Status: DC | PRN

## 2019-10-07 MED ORDER — CYANOCOBALAMIN 500 MCG PO TABS
500.0000 ug | ORAL_TABLET | Freq: Every day | ORAL | Status: DC
  Administered 2019-10-07 – 2019-10-10 (×4): 500 ug via ORAL
  Filled 2019-10-07 (×4): qty 1

## 2019-10-07 NOTE — Progress Notes (Signed)
Telemetry called and patient had a rhythym change at 11:27 until 11:37 she said it was a widening QRS and they called back she is back in that rhythm. Messaged Dr. Myriam Forehand and he said ok, no new orders at this time.

## 2019-10-07 NOTE — Progress Notes (Addendum)
Progress Note    Molly Brown  TJQ:[id number] DOB: 04-15-1951  DOA: 10/06/2019 PCP: Patient, No Pcp Per      Brief Narrative:    Medical records reviewed and are as summarized below:  Molly Brown is an 69 y.o. female with medical history significant of HTN,A.fib on eliquis seen in ed for generalized malaise was presents with concern for symptoms due to possible COVID-19, primarily consisting of vomiting and diarrhea, persistent course over the last 10 days, and associated with fever and chills, body aches, and generalized weakness and malaise. The patient has had a mild cough but denies significant shortness of breath. Her husband is positive for COVID-19      Assessment/Plan:   Principal Problem:   Pneumonia due to COVID-19 virus Active Problems:   Chronic atrial fibrillation (HCC)   Gastroenteritis due to COVID-19 virus   GERD (gastroesophageal reflux disease)   Subclinical hypothyroidism    COVID-19 pneumonia and gastroenteritis: Continue IV steroids and IV remdesivir.  Acute hypoxemic respiratory failure: Continue oxygen via nasal cannula and taper off as able.  History of atrial fibrillation: Obtain baseline EKG.  Patient said she stopped taking Eliquis, metoprolol and flecainide on her own after she got sick from taking vaccinations close together (flu shot, pneumonia shot and shingles shot).  She prefers to discuss treatment further with cardiologist/PCP.  Elevated TSH (12.015): Check T4 and T3 levels  Body mass index is 31.85 kg/m.  (Obesity)   Family Communication/Anticipated D/C date and plan/Code Status   DVT prophylaxis: Heparin Code Status: Full code Family Communication: Plan discussed with patient Disposition Plan: Possible discharge to home in 2 to 3 days depending on clinical improvement      Subjective:   No shortness of breath or chest pain.  Objective:    Vitals:   10/06/19 1748 10/06/19 2056 10/07/19 0128 10/07/19 0754  BP:  117/64  116/68 129/73  Pulse: 96  72 74  Resp: (!) 23 19 16 17   Temp: 98.4 F (36.9 C)  98.3 F (36.8 C) 97.8 F (36.6 C)  TempSrc: Oral  Oral   SpO2:   96% 100%  Weight: 95 kg     Height: 5' 7.99" (1.727 m)       Intake/Output Summary (Last 24 hours) at 10/07/2019 1239 Last data filed at 10/06/2019 2100 Gross per 24 hour  Intake 250 ml  Output 200 ml  Net 50 ml   Filed Weights   10/06/19 0955 10/06/19 1748  Weight: 94.3 kg 95 kg    Exam:  GEN: NAD SKIN: Warm and dry.  Diffuse hypopigmented patches on the face and trunk EYES: EOMI ENT: MMM CV: RRR PULM: CTA B ABD: soft, ND, NT, +BS CNS: AAO x 3, non focal EXT: No edema or tenderness   Data Reviewed:   I have personally reviewed following labs and imaging studies:  Labs: Labs show the following:   Basic Metabolic Panel: Recent Labs  Lab 10/06/19 1117 10/07/19 0304  NA 133* 137  K 4.3 4.3  CL 101 104  CO2 21* 22  GLUCOSE 123* 125*  BUN 14 19  CREATININE 0.90 0.91  CALCIUM 8.9 9.0   GFR Estimated Creatinine Clearance: 73.2 mL/min (by C-G formula based on SCr of 0.91 mg/dL). Liver Function Tests: Recent Labs  Lab 10/06/19 1117 10/07/19 0304  AST 29 31  ALT 19 20  ALKPHOS 54 55  BILITOT 0.7 0.6  PROT 7.4 7.5  ALBUMIN 4.0 3.7   No results  for input(s): LIPASE, AMYLASE in the last 168 hours. No results for input(s): AMMONIA in the last 168 hours. Coagulation profile No results for input(s): INR, PROTIME in the last 168 hours.  CBC: Recent Labs  Lab 10/06/19 1117  WBC 4.7  NEUTROABS 3.3  HGB 14.0  HCT 42.1  MCV 89.4  PLT 160   Cardiac Enzymes: No results for input(s): CKTOTAL, CKMB, CKMBINDEX, TROPONINI in the last 168 hours. BNP (last 3 results) No results for input(s): PROBNP in the last 8760 hours. CBG: No results for input(s): GLUCAP in the last 168 hours. D-Dimer: No results for input(s): DDIMER in the last 72 hours. Hgb A1c: No results for input(s): HGBA1C in the last 72  hours. Lipid Profile: Recent Labs    10/06/19 1238  TRIG 64   Thyroid function studies: Recent Labs    10/06/19 1736  TSH 12.015*   Anemia work up: Recent Labs    10/06/19 2046 10/07/19 0304  FERRITIN 202 196   Sepsis Labs: Recent Labs  Lab 10/06/19 1117 10/06/19 1237  PROCALCITON  --  <0.10  WBC 4.7  --     Microbiology Recent Results (from the past 240 hour(s))  Respiratory Panel by RT PCR (Flu A&B, Covid) - Nasopharyngeal Swab     Status: Abnormal   Collection Time: 10/06/19  1:52 PM   Specimen: Nasopharyngeal Swab  Result Value Ref Range Status   SARS Coronavirus 2 by RT PCR POSITIVE (A) NEGATIVE Final    Comment: RESULT CALLED TO, READ BACK BY AND VERIFIED WITH: KATIE FERGUSSON @1532  10/06/19 MJU (NOTE) SARS-CoV-2 target nucleic acids are DETECTED. SARS-CoV-2 RNA is generally detectable in upper respiratory specimens  during the acute phase of infection. Positive results are indicative of the presence of the identified virus, but do not rule out bacterial infection or co-infection with other pathogens not detected by the test. Clinical correlation with patient history and other diagnostic information is necessary to determine patient infection status. The expected result is Negative. Fact Sheet for Patients:  12/04/19 Fact Sheet for Healthcare Providers: https://www.moore.com/ This test is not yet approved or cleared by the https://www.young.biz/ FDA and  has been authorized for detection and/or diagnosis of SARS-CoV-2 by FDA under an Emergency Use Authorization (EUA).  This EUA will remain in effect (meaning this test can be used) for  the duration of  the COVID-19 declaration under Section 564(b)(1) of the Act, 21 U.S.C. section 360bbb-3(b)(1), unless the authorization is terminated or revoked sooner.    Influenza A by PCR NEGATIVE NEGATIVE Final   Influenza B by PCR NEGATIVE NEGATIVE Final    Comment:  (NOTE) The Xpert Xpress SARS-CoV-2/FLU/RSV assay is intended as an aid in  the diagnosis of influenza from Nasopharyngeal swab specimens and  should not be used as a sole basis for treatment. Nasal washings and  aspirates are unacceptable for Xpert Xpress SARS-CoV-2/FLU/RSV  testing. Fact Sheet for Patients: [place] Fact Sheet for Healthcare Providers: https://www.moore.com/ This test is not yet approved or cleared by the https://www.young.biz/ FDA and  has been authorized for detection and/or diagnosis of SARS-CoV-2 by  FDA under an Emergency Use Authorization (EUA). This EUA will remain  in effect (meaning this test can be used) for the duration of the  Covid-19 declaration under Section 564(b)(1) of the Act, 21  U.S.C. section 360bbb-3(b)(1), unless the authorization is  terminated or revoked. Performed at [facility], [address] [place], [place]     Procedures and diagnostic  studies:  DG Chest Portable 1 View  Result Date: 10/06/2019 CLINICAL DATA:  Possible COVID EXAM: PORTABLE CHEST 1 VIEW COMPARISON:  None. FINDINGS: The heart size and mediastinal contours are within normal limits. Mild, diffuse interstitial pulmonary opacity. The visualized skeletal structures are unremarkable. IMPRESSION: Mild, diffuse interstitial pulmonary opacity, most consistent with atypical or viral infection, or alternately pulmonary edema. There is no focal airspace opacity. Electronically Signed   By: Lauralyn Primes M.D.   On: 10/06/2019 10:23    Medications:   . vitamin C  500 mg Oral Daily  . dexamethasone (DECADRON) injection  6 mg Intravenous Q24H  . heparin  5,000 Units Subcutaneous Q8H  . vitamin B-12  500 mcg Oral Daily  . zinc sulfate  220 mg Oral Daily   Continuous Infusions: . remdesivir 100 mg in NS 100 mL 100 mg (10/07/19 0823)     LOS: 1 day   Rosibel Giacobbe  [facility]     10/07/2019, 12:39 PM

## 2019-10-08 LAB — COMPREHENSIVE METABOLIC PANEL
ALT: 18 U/L (ref 0–44)
AST: 27 U/L (ref 15–41)
Albumin: 3.8 g/dL (ref 3.5–5.0)
Alkaline Phosphatase: 52 U/L (ref 38–126)
Anion gap: 9 (ref 5–15)
BUN: 27 mg/dL — ABNORMAL HIGH (ref 8–23)
CO2: 26 mmol/L (ref 22–32)
Calcium: 9.2 mg/dL (ref 8.9–10.3)
Chloride: 104 mmol/L (ref 98–111)
Creatinine, Ser: 0.83 mg/dL (ref 0.44–1.00)
GFR calc Af Amer: >60 mL/min (ref >60)
GFR calc non Af Amer: >60 mL/min (ref >60)
Glucose, Bld: 122 mg/dL — ABNORMAL HIGH (ref 70–99)
Potassium: 4.5 mmol/L (ref 3.5–5.1)
Sodium: 139 mmol/L (ref 135–145)
Total Bilirubin: 0.6 mg/dL (ref 0.3–1.2)
Total Protein: 7.2 g/dL (ref 6.5–8.1)

## 2019-10-08 LAB — FIBRIN DERIVATIVES D-DIMER (ARMC ONLY): Fibrin derivatives D-dimer (ARMC): 1338.08 ng/mL (FEU) — ABNORMAL HIGH (ref 0.00–499.00)

## 2019-10-08 LAB — T3, FREE: T3, Free: 0.8 pg/mL — ABNORMAL LOW (ref 2.0–4.4)

## 2019-10-08 LAB — C-REACTIVE PROTEIN: CRP: 2.3 mg/dL — ABNORMAL HIGH (ref <1.0)

## 2019-10-08 LAB — FERRITIN: Ferritin: 221 ng/mL (ref 11–307)

## 2019-10-08 MED ORDER — LEVOTHYROXINE SODIUM 25 MCG PO TABS
25.0000 ug | ORAL_TABLET | Freq: Every day | ORAL | Status: DC
  Administered 2019-10-09 – 2019-10-10 (×2): 25 ug via ORAL
  Filled 2019-10-08 (×2): qty 1

## 2019-10-08 MED ORDER — APIXABAN 5 MG PO TABS
5.0000 mg | ORAL_TABLET | Freq: Two times a day (BID) | ORAL | Status: DC
  Administered 2019-10-08 – 2019-10-10 (×5): 5 mg via ORAL
  Filled 2019-10-08 (×5): qty 1

## 2019-10-08 MED ORDER — METOPROLOL TARTRATE 50 MG PO TABS
50.0000 mg | ORAL_TABLET | Freq: Two times a day (BID) | ORAL | Status: DC
  Administered 2019-10-08 – 2019-10-10 (×5): 50 mg via ORAL
  Filled 2019-10-08 (×5): qty 1

## 2019-10-08 NOTE — Progress Notes (Addendum)
Progress Note    Molly Brown  ZOX:[id number] DOB: 1951/08/05  DOA: 10/06/2019 PCP: Patient, No Pcp Per      Brief Narrative:    Medical records reviewed and are as summarized below:  Molly Brown is an 69 y.o. female with medical history significant of HTN,A.fib on eliquis seen in ed for generalized malaise was presents with concern for symptoms due to possible COVID-19, primarily consisting of vomiting and diarrhea, persistent course over the last 10 days, and associated with fever and chills, body aches, and generalized weakness and malaise. The patient has had a mild cough but denies significant shortness of breath. Her husband is positive for COVID-19      Assessment/Plan:   Principal Problem:   Pneumonia due to COVID-19 virus Active Problems:   Chronic atrial fibrillation (HCC)   Gastroenteritis due to COVID-19 virus   GERD (gastroesophageal reflux disease)   Hypothyroidism    COVID-19 pneumonia and gastroenteritis: Vomiting and diarrhea have improved.  Continue IV steroids and IV remdesivir.  Acute hypoxemic respiratory failure: Resolved.  Check oxygen saturation with ambulation on room air.  atrial fibrillation with rapid ventricular response: EKG showed atrial fibrillation.  Telemetry showed atrial fibrillation with occasional tachycardia.medications for atrial fibrillation were discussed with the patient again.  She has agreed to restart Eliquis and metoprolol.  NB: Patient said she had stopped taking Eliquis, metoprolol and flecainide on her own after she got sick from taking vaccinations close together (flu shot, pneumonia shot and shingles shot).   Elevated TSH (12.015)/ hypothyroidism: Free T4 was normal but free T3 was low.  Start low-dose Synthroid.  Body mass index is 31.85 kg/m.  (Obesity)   Family Communication/Anticipated D/C date and plan/Code Status   DVT prophylaxis: Eliquis  code Status: Full code Family Communication: Plan discussed  with patient Disposition Plan: Possible discharge to home in 1 to 2 days depending if heart rate is controlled     Subjective:   She feels better today.  No chest pain or shortness of breath.  Overnight events noted.  Patient became tachycardic when she got up to change plans.  Heart rate was in the 150s.  Objective:    Vitals:   10/08/19 1030 10/08/19 1045 10/08/19 1102 10/08/19 1200  BP: 135/81 (!) 159/85 111/68 131/82  Pulse: 88 75 84 73  Resp: 19 15 18 15   Temp:      TempSrc:      SpO2: 94% 97% 95% 95%  Weight:      Height:        Intake/Output Summary (Last 24 hours) at 10/08/2019 1441 Last data filed at 10/08/2019 1054 Gross per 24 hour  Intake 201 ml  Output --  Net 201 ml   Filed Weights   10/06/19 0955 10/06/19 1748  Weight: 94.3 kg 95 kg    Exam:  GEN: NAD SKIN: Warm and dry.  Diffuse hypopigmented patches on the face and trunk EYES: EOMI ENT: MMM CV: Irregular rate and rhythm PULM: CTA B ABD: soft, ND, NT, +BS CNS: AAO x 3, non focal EXT: No edema or tenderness   Data Reviewed:   I have personally reviewed following labs and imaging studies:  Labs: Labs show the following:   Basic Metabolic Panel: Recent Labs  Lab 10/06/19 1117 10/06/19 1117 10/07/19 0304 10/08/19 0601  NA 133*  --  137 139  K 4.3   < > 4.3 4.5  CL 101  --  104 104  CO2 21*  --  22 26  GLUCOSE 123*  --  125* 122*  BUN 14  --  19 27*  CREATININE 0.90  --  0.91 0.83  CALCIUM 8.9  --  9.0 9.2   < > = values in this interval not displayed.   GFR Estimated Creatinine Clearance: 80.3 mL/min (by C-G formula based on SCr of 0.83 mg/dL). Liver Function Tests: Recent Labs  Lab 10/06/19 1117 10/07/19 0304 10/08/19 0601  AST 29 31 27   ALT 19 20 18   ALKPHOS 54 55 52  BILITOT 0.7 0.6 0.6  PROT 7.4 7.5 7.2  ALBUMIN 4.0 3.7 3.8   No results for input(s): LIPASE, AMYLASE in the last 168 hours. No results for input(s): AMMONIA in the last 168 hours. Coagulation  profile No results for input(s): INR, PROTIME in the last 168 hours.  CBC: Recent Labs  Lab 10/06/19 1117  WBC 4.7  NEUTROABS 3.3  HGB 14.0  HCT 42.1  MCV 89.4  PLT 160   Cardiac Enzymes: No results for input(s): CKTOTAL, CKMB, CKMBINDEX, TROPONINI in the last 168 hours. BNP (last 3 results) No results for input(s): PROBNP in the last 8760 hours. CBG: No results for input(s): GLUCAP in the last 168 hours. D-Dimer: No results for input(s): DDIMER in the last 72 hours. Hgb A1c: No results for input(s): HGBA1C in the last 72 hours. Lipid Profile: Recent Labs    10/06/19 1238  TRIG 64   Thyroid function studies: Recent Labs    10/06/19 1736 10/07/19 0304  TSH 12.015*  --   T3FREE  --  0.8*   Anemia work up: Recent Labs    10/07/19 0304 10/08/19 0601  FERRITIN 196 221   Sepsis Labs: Recent Labs  Lab 10/06/19 1117 10/06/19 1237  PROCALCITON  --  <0.10  WBC 4.7  --     Microbiology Recent Results (from the past 240 hour(s))  Respiratory Panel by RT PCR (Flu A&B, Covid) - Nasopharyngeal Swab     Status: Abnormal   Collection Time: 10/06/19  1:52 PM   Specimen: Nasopharyngeal Swab  Result Value Ref Range Status   SARS Coronavirus 2 by RT PCR POSITIVE (A) NEGATIVE Final    Comment: RESULT CALLED TO, READ BACK BY AND VERIFIED WITH: KATIE FERGUSSON @1532  10/06/19 MJU (NOTE) SARS-CoV-2 target nucleic acids are DETECTED. SARS-CoV-2 RNA is generally detectable in upper respiratory specimens  during the acute phase of infection. Positive results are indicative of the presence of the identified virus, but do not rule out bacterial infection or co-infection with other pathogens not detected by the test. Clinical correlation with patient history and other diagnostic information is necessary to determine patient infection status. The expected result is Negative. Fact Sheet for Patients:  PinkCheek.be Fact Sheet for Healthcare  Providers: GravelBags.it This test is not yet approved or cleared by the [place] FDA and  has been authorized for detection and/or diagnosis of SARS-CoV-2 by FDA under an Emergency Use Authorization (EUA).  This EUA will remain in effect (meaning this test can be used) for  the duration of  the COVID-19 declaration under Section 564(b)(1) of the Act, 21 U.S.C. section 360bbb-3(b)(1), unless the authorization is terminated or revoked sooner.    Influenza A by PCR NEGATIVE NEGATIVE Final   Influenza B by PCR NEGATIVE NEGATIVE Final    Comment: (NOTE) The Xpert Xpress SARS-CoV-2/FLU/RSV assay is intended as an aid in  the diagnosis of influenza from Nasopharyngeal swab specimens and  should not be used as a  sole basis for treatment. Nasal washings and  aspirates are unacceptable for Xpert Xpress SARS-CoV-2/FLU/RSV  testing. Fact Sheet for Patients: https://www.moore.com/ Fact Sheet for Healthcare Providers: https://www.young.biz/ This test is not yet approved or cleared by the [place] FDA and  has been authorized for detection and/or diagnosis of SARS-CoV-2 by  FDA under an Emergency Use Authorization (EUA). This EUA will remain  in effect (meaning this test can be used) for the duration of the  Covid-19 declaration under Section 564(b)(1) of the Act, 21  U.S.C. section 360bbb-3(b)(1), unless the authorization is  terminated or revoked. Performed at [facility], [address] [place], [place] [address]     Procedures and diagnostic studies:  No results found.  Medications:   . apixaban  5 mg Oral BID  . vitamin C  500 mg Oral Daily  . dexamethasone (DECADRON) injection  6 mg Intravenous Q24H  . [START ON 10/09/2019] levothyroxine  25 mcg Oral Q0600  . metoprolol tartrate  50 mg Oral BID  . vitamin B-12  500 mcg Oral Daily  . zinc sulfate  220 mg Oral Daily   Continuous Infusions: .  remdesivir 100 mg in NS 100 mL Stopped (10/08/19 0928)     LOS: 2 days   Molly Brown  [facility]     10/08/2019, 2:41 PM

## 2019-10-08 NOTE — Progress Notes (Signed)
   10/08/19 0825  Vitals  Pulse Rate (!) 152  ECG Heart Rate (!) 138  Resp (!) 29  Oxygen Therapy  SpO2 96 %  MEWS Score  MEWS Temp 0  MEWS Systolic 0  MEWS Pulse 3  MEWS RR 2  MEWS LOC 0  MEWS Score 5  MEWS Score Color Red  CCMD called this nurse about HR in the 150s, patient is up changing her pants.  Patient denies chest pain.  Once back in bed HR returned to normal limits.  Patient recently stopped her metolprolol and eliquis.  Made Dr. Myriam Forehand aware and he will se patient and likely restart those mediations.  Orson Ape, BSN.

## 2019-10-09 DIAGNOSIS — I482 Chronic atrial fibrillation, unspecified: Secondary | ICD-10-CM

## 2019-10-09 DIAGNOSIS — E039 Hypothyroidism, unspecified: Secondary | ICD-10-CM

## 2019-10-09 DIAGNOSIS — A0839 Other viral enteritis: Secondary | ICD-10-CM

## 2019-10-09 LAB — C-REACTIVE PROTEIN: CRP: 1.3 mg/dL — ABNORMAL HIGH (ref <1.0)

## 2019-10-09 LAB — FERRITIN: Ferritin: 209 ng/mL (ref 11–307)

## 2019-10-09 LAB — COMPREHENSIVE METABOLIC PANEL
ALT: 20 U/L (ref 0–44)
AST: 27 U/L (ref 15–41)
Albumin: 3.4 g/dL — ABNORMAL LOW (ref 3.5–5.0)
Alkaline Phosphatase: 48 U/L (ref 38–126)
Anion gap: 7 (ref 5–15)
BUN: 23 mg/dL (ref 8–23)
CO2: 25 mmol/L (ref 22–32)
Calcium: 8.8 mg/dL — ABNORMAL LOW (ref 8.9–10.3)
Chloride: 107 mmol/L (ref 98–111)
Creatinine, Ser: 0.78 mg/dL (ref 0.44–1.00)
GFR calc Af Amer: >60 mL/min (ref >60)
GFR calc non Af Amer: >60 mL/min (ref >60)
Glucose, Bld: 122 mg/dL — ABNORMAL HIGH (ref 70–99)
Potassium: 4.3 mmol/L (ref 3.5–5.1)
Sodium: 139 mmol/L (ref 135–145)
Total Bilirubin: 0.7 mg/dL (ref 0.3–1.2)
Total Protein: 6.7 g/dL (ref 6.5–8.1)

## 2019-10-09 LAB — FIBRIN DERIVATIVES D-DIMER (ARMC ONLY): Fibrin derivatives D-dimer (ARMC): 748 ng/mL (FEU) — ABNORMAL HIGH (ref 0.00–499.00)

## 2019-10-09 MED ORDER — DEXAMETHASONE 4 MG PO TABS
6.0000 mg | ORAL_TABLET | Freq: Every day | ORAL | Status: DC
  Administered 2019-10-10: 08:00:00 6 mg via ORAL
  Filled 2019-10-09: qty 2

## 2019-10-09 NOTE — Progress Notes (Signed)
Dr Allena Katz aware of BP 98/55, per MD continue to monitor BP

## 2019-10-09 NOTE — Progress Notes (Addendum)
[facility]  - [place] at [facility]   PATIENT NAME: Molly Brown    MR#:  [id number]  DATE OF BIRTH:  03/17/1951  SUBJECTIVE:  patient feels a lot better today. She is eating and drinking well. No fever.  REVIEW OF SYSTEMS:   Review of Systems  Constitutional: Negative for chills, fever and weight loss.  HENT: Negative for ear discharge, ear pain and nosebleeds.   Eyes: Negative for blurred vision, pain and discharge.  Respiratory: Positive for shortness of breath. Negative for sputum production, wheezing and stridor.   Cardiovascular: Negative for chest pain, palpitations, orthopnea and PND.  Gastrointestinal: Negative for abdominal pain, diarrhea, nausea and vomiting.  Genitourinary: Negative for frequency and urgency.  Musculoskeletal: Negative for back pain and joint pain.  Neurological: Negative for sensory change, speech change, focal weakness and weakness.  Psychiatric/Behavioral: Negative for depression and hallucinations. The patient is not nervous/anxious.    Tolerating Diet:yes Tolerating PT: ambulatory  DRUG ALLERGIES:   Allergies  Allergen Reactions  . Eszopiclone Other (See Comments)    Felt really weird  . Flecainide Other (See Comments) and Rash    Insomnia and felt drugged    VITALS:  Blood pressure 136/88, pulse 87, temperature 98.1 F (36.7 C), resp. rate 16, height 5' 7.99" (1.727 m), weight 95 kg, SpO2 98 %.  PHYSICAL EXAMINATION:   Physical Exam  GENERAL:  69 y.o.-year-old patient lying in the bed with no acute distress.  EYES: Pupils equal, round, reactive to light and accommodation. No scleral icterus.   HEENT: Head atraumatic, normocephalic. Oropharynx and nasopharynx clear.  NECK:  Supple, no jugular venous distention. No thyroid enlargement, no tenderness.  LUNGS: decreased breath sounds bilaterally, no wheezing, rales, rhonchi. No use of accessory muscles of respiration.  CARDIOVASCULAR: S1, S2 normal. No murmurs, rubs, or  gallops.  ABDOMEN: Soft, nontender, nondistended. Bowel sounds present. No organomegaly or mass.  EXTREMITIES: No cyanosis, clubbing or edema b/l.    NEUROLOGIC: Cranial nerves II through XII are intact. No focal Motor or sensory deficits b/l.   PSYCHIATRIC:  patient is alert and oriented x 3.  SKIN: No obvious rash, lesion, or ulcer.   LABORATORY PANEL:  CBC Recent Labs  Lab 10/06/19 1117  WBC 4.7  HGB 14.0  HCT 42.1  PLT 160    Chemistries  Recent Labs  Lab 10/09/19 0358  NA 139  K 4.3  CL 107  CO2 25  GLUCOSE 122*  BUN 23  CREATININE 0.78  CALCIUM 8.8*  AST 27  ALT 20  ALKPHOS 48  BILITOT 0.7   Cardiac Enzymes No results for input(s): TROPONINI in the last 168 hours. RADIOLOGY:  No results found. ASSESSMENT AND PLAN:  Molly Brown is an 69 y.o. female with medical history significant ofHTN,A.fib on eliquis seen in ed for generalized malaise waspresents with concern for symptoms due to possible COVID-19, primarily consisting of vomiting and diarrhea, persistent course over the last 10 days  COVID-19 pneumonia and gastroenteritis:  -Vomiting and diarrhea have improved.  - Continue IV steroids--> change to oral steroids and IV remdesivir day 4/5 -sats >92% on RA  Acute hypoxemic respiratory failure:  -Resolved.  - sats 98% on RA  Atrial fibrillation with rapid ventricular response:  -EKG showed atrial fibrillation.   -agreed to restart Eliquis and metoprolol.  Hypothyroidism Cont synthroid  Note: Patient said she had stopped taking Eliquis, metoprolol and flecainide on her own after she got sick from taking vaccinations close together (flu shot,  pneumonia shot and shingles shot).    Procedures:none Family communication :patient Consults :none Discharge Disposition :Home CODE STATUS: FULL DVT Prophylaxis :Eliquis Barriers to discharge: discharge to home in am after completing 5th dose of Remdisivir and pt remains stable.  TOTAL TIME TAKING  CARE OF THIS PATIENT: *25* minutes.  >50% time spent on counselling and coordination of care  Note: This dictation was prepared with Dragon dictation along with smaller phrase technology. Any transcriptional errors that result from this process are unintentional.  Fritzi Mandes M.D    [facility]   CC: Primary care physician; Patient, No Pcp PerPatient ID: Molly Brown, female   DOB: [birth year], 69 y.o.   MRN: [id number]

## 2019-10-09 NOTE — Progress Notes (Signed)
Dr Allena Katz made aware that tele monitoring reports pause of 2.46 sec, acknowledged, no new orders

## 2019-10-10 LAB — COMPREHENSIVE METABOLIC PANEL
ALT: 21 U/L (ref 0–44)
AST: 23 U/L (ref 15–41)
Albumin: 3.4 g/dL — ABNORMAL LOW (ref 3.5–5.0)
Alkaline Phosphatase: 48 U/L (ref 38–126)
Anion gap: 10 (ref 5–15)
BUN: 22 mg/dL (ref 8–23)
CO2: 24 mmol/L (ref 22–32)
Calcium: 9.1 mg/dL (ref 8.9–10.3)
Chloride: 104 mmol/L (ref 98–111)
Creatinine, Ser: 0.7 mg/dL (ref 0.44–1.00)
GFR calc Af Amer: >60 mL/min (ref >60)
GFR calc non Af Amer: >60 mL/min (ref >60)
Glucose, Bld: 107 mg/dL — ABNORMAL HIGH (ref 70–99)
Potassium: 4.2 mmol/L (ref 3.5–5.1)
Sodium: 138 mmol/L (ref 135–145)
Total Bilirubin: 0.7 mg/dL (ref 0.3–1.2)
Total Protein: 7 g/dL (ref 6.5–8.1)

## 2019-10-10 LAB — CBC
HCT: 41.3 % (ref 36.0–46.0)
Hemoglobin: 13.8 g/dL (ref 12.0–15.0)
MCH: 30 pg (ref 26.0–34.0)
MCHC: 33.4 g/dL (ref 30.0–36.0)
MCV: 89.8 fL (ref 80.0–100.0)
Platelets: 234 10*3/uL (ref 150–400)
RBC: 4.6 MIL/uL (ref 3.87–5.11)
RDW: 13 % (ref 11.5–15.5)
WBC: 5.6 10*3/uL (ref 4.0–10.5)
nRBC: 0 % (ref 0.0–0.2)

## 2019-10-10 LAB — FERRITIN: Ferritin: 203 ng/mL (ref 11–307)

## 2019-10-10 LAB — C-REACTIVE PROTEIN: CRP: 0.9 mg/dL (ref <1.0)

## 2019-10-10 LAB — FIBRIN DERIVATIVES D-DIMER (ARMC ONLY): Fibrin derivatives D-dimer (ARMC): 685.67 ng/mL (FEU) — ABNORMAL HIGH (ref 0.00–499.00)

## 2019-10-10 LAB — MAGNESIUM: Magnesium: 2.1 mg/dL (ref 1.7–2.4)

## 2019-10-10 MED ORDER — APIXABAN 5 MG PO TABS: 5.0000 mg | ORAL_TABLET | Freq: Two times a day (BID) | ORAL | 2 refills | Status: AC

## 2019-10-10 MED ORDER — DEXAMETHASONE 6 MG PO TABS: 6.0000 mg | ORAL_TABLET | Freq: Every day | ORAL | 0 refills | Status: DC

## 2019-10-10 MED ORDER — METOPROLOL TARTRATE 50 MG PO TABS: 50.0000 mg | ORAL_TABLET | Freq: Two times a day (BID) | ORAL | 1 refills | Status: AC

## 2019-10-10 MED ORDER — LEVOTHYROXINE SODIUM 25 MCG PO TABS: 25.0000 ug | ORAL_TABLET | Freq: Every day | ORAL | 0 refills | Status: DC

## 2019-10-10 NOTE — Progress Notes (Signed)
Manuela Schwartz, NP made aware that tele monitoring reports 2.81 sec pause, acknowledged, no new orders

## 2019-10-10 NOTE — Discharge Summary (Signed)
[facility] - [place] at [facility]   PATIENT NAME: Molly Brown    MR#:  [id number]  DATE OF BIRTH:  03-Jan-1951  DATE OF ADMISSION:  10/06/2019 ADMITTING PHYSICIAN: Gertha Calkin, MD  DATE OF DISCHARGE: 10/10/2019  PRIMARY CARE PHYSICIAN: Patient, No Pcp Per    ADMISSION DIAGNOSIS:  Hypoxia [R09.02] Pneumonia due to COVID-19 virus [U07.1, J12.82] COVID-19 [U07.1]  DISCHARGE DIAGNOSIS:  Pneumonia due to COVID-19 Paroxysmal afib  SECONDARY DIAGNOSIS:   Past Medical History:  Diagnosis Date  . AF (atrial fibrillation) (HCC)   . Anal fissure   . Anxiety   . Cardiac arrhythmia due to congenital heart disease   . Chicken pox   . Depression   . GERD (gastroesophageal reflux disease)   . Heart murmur     HOSPITAL COURSE:   [name] an 69 y.o.femalewith medical history significant ofHTN,A.fib on eliquis seen in ed for generalized malaise waspresents with concern for symptoms due to possible COVID-19, primarily consisting of vomiting and diarrhea, persistent course over the last 10 days  COVID-19 pneumonia and gastroenteritis: -Vomiting and diarrhea have improved. -Continue IV steroids--> change to oral steroids and IV remdesivir day 5/5 -sats >92% on RA. Pt denies any complaints today  Acute hypoxemic respiratory failure: -Resolved.  -sats 98% on RA  Atrial fibrillationwith rapid ventricular response: -EKG showed atrial fibrillation.  -agreed to restart Eliquis and metoprolol. -HR 80-85. Pause of 2.8 sec reported yday. Pt asymptomatic -she wants to see [facility] cardiology locally. Will try to get [facility] cardiology appt  Hypothyroidism Cont synthroid  Note:Patient said shehadstopped taking Eliquis, metoprolol and flecainide on her own after she got sick from taking vaccinations close together (flu shot, pneumonia shot and shingles shot).    Procedures:none Family communication :patient Consults :none Discharge Disposition  :Home CODE STATUS: FULL DVT Prophylaxis :Eliquis Barriers to discharge: discharge to home today CONSULTS OBTAINED:    DRUG ALLERGIES:   Allergies  Allergen Reactions  . Eszopiclone Other (See Comments)    Felt really weird  . Flecainide Other (See Comments) and Rash    Insomnia and felt drugged    DISCHARGE MEDICATIONS:   Allergies as of 10/10/2019      Reactions   Eszopiclone Other (See Comments)   Felt really weird   Flecainide Other (See Comments), Rash   Insomnia and felt drugged      Medication List    STOP taking these medications   flecainide 50 MG tablet Commonly known as: TAMBOCOR     TAKE these medications   acetaminophen 325 MG tablet Commonly known as: TYLENOL Take 650 mg by mouth every 6 (six) hours as needed.   apixaban 5 MG Tabs tablet Commonly known as: ELIQUIS Take 1 tablet (5 mg total) by mouth 2 (two) times daily.   dexamethasone 6 MG tablet Commonly known as: DECADRON Take 1 tablet (6 mg total) by mouth daily. Start taking on: October 11, 2019   levothyroxine 25 MCG tablet Commonly known as: SYNTHROID Take 1 tablet (25 mcg total) by mouth daily at 6 (six) AM. Start taking on: October 11, 2019   metoprolol tartrate 50 MG tablet Commonly known as: LOPRESSOR Take 1 tablet (50 mg total) by mouth 2 (two) times daily.   multivitamin Liqd Take 5 mLs by mouth daily.   omeprazole 40 MG capsule Commonly known as: PRILOSEC TAKE 1 CAPSULE (40 MG TOTAL) BY MOUTH DAILY.   polyethylene glycol 17 g packet Commonly known as: MIRALAX / GLYCOLAX Take 17  g by mouth as needed.   Probiotic Daily Caps Take by mouth.   vitamin B-12 500 MCG tablet Commonly known as: CYANOCOBALAMIN Take 500 mcg by mouth daily.       If you experience worsening of your admission symptoms, develop shortness of breath, life threatening emergency, suicidal or homicidal thoughts you must seek medical attention immediately by calling 911 or calling your MD  immediately  if symptoms less severe.  You Must read complete instructions/literature along with all the possible adverse reactions/side effects for all the Medicines you take and that have been prescribed to you. Take any new Medicines after you have completely understood and accept all the possible adverse reactions/side effects.   Please note  You were cared for by a hospitalist during your hospital stay. If you have any questions about your discharge medications or the care you received while you were in the hospital after you are discharged, you can call the unit and asked to speak with the hospitalist on call if the hospitalist that took care of you is not available. Once you are discharged, your primary care physician will handle any further medical issues. Please note that NO REFILLS for any discharge medications will be authorized once you are discharged, as it is imperative that you return to your primary care physician (or establish a relationship with a primary care physician if you do not have one) for your aftercare needs so that they can reassess your need for medications and monitor your lab values. Today   SUBJECTIVE   No complaints  VITAL SIGNS:  Blood pressure 139/74, pulse 65, temperature 97.7 F (36.5 C), temperature source Oral, resp. rate 20, height 5' 7.99" (1.727 m), weight 95 kg, SpO2 96 %.  I/O:    Intake/Output Summary (Last 24 hours) at 10/10/2019 0841 Last data filed at 10/09/2019 1003 Gross per 24 hour  Intake 100 ml  Output --  Net 100 ml    PHYSICAL EXAMINATION:  GENERAL:  69 y.o.-year-old patient lying in the bed with no acute distress.  EYES: Pupils equal, round, reactive to light and accommodation. No scleral icterus.  HEENT: Head atraumatic, normocephalic. Oropharynx and nasopharynx clear.  NECK:  Supple, no jugular venous distention. No thyroid enlargement, no tenderness.  LUNGS: Normal breath sounds bilaterally, no wheezing, rales,rhonchi or  crepitation. No use of accessory muscles of respiration.  CARDIOVASCULAR: S1, S2 normal. No murmurs, rubs, or gallops.  ABDOMEN: Soft, non-tender, non-distended. Bowel sounds present. No organomegaly or mass.  EXTREMITIES: No pedal edema, cyanosis, or clubbing.  NEUROLOGIC: Cranial nerves II through XII are intact. Muscle strength 5/5 in all extremities. Sensation intact. Gait not checked.  PSYCHIATRIC: The patient is alert and oriented x 3.  SKIN: No obvious rash, lesion, or ulcer.   DATA REVIEW:   CBC  Recent Labs  Lab 10/10/19 0453  WBC 5.6  HGB 13.8  HCT 41.3  PLT 234    Chemistries  Recent Labs  Lab 10/10/19 0453  NA 138  K 4.2  CL 104  CO2 24  GLUCOSE 107*  BUN 22  CREATININE 0.70  CALCIUM 9.1  MG 2.1  AST 23  ALT 21  ALKPHOS 48  BILITOT 0.7    Microbiology Results   Recent Results (from the past 240 hour(s))  Respiratory Panel by RT PCR (Flu A&B, Covid) - Nasopharyngeal Swab     Status: Abnormal   Collection Time: 10/06/19  1:52 PM   Specimen: Nasopharyngeal Swab  Result Value Ref Range Status  SARS Coronavirus 2 by RT PCR POSITIVE (A) NEGATIVE Final    Comment: RESULT CALLED TO, READ BACK BY AND VERIFIED WITH: KATIE FERGUSSON @1532  10/06/19 MJU (NOTE) SARS-CoV-2 target nucleic acids are DETECTED. SARS-CoV-2 RNA is generally detectable in upper respiratory specimens  during the acute phase of infection. Positive results are indicative of the presence of the identified virus, but do not rule out bacterial infection or co-infection with other pathogens not detected by the test. Clinical correlation with patient history and other diagnostic information is necessary to determine patient infection status. The expected result is Negative. Fact Sheet for Patients:  PinkCheek.be Fact Sheet for Healthcare Providers: GravelBags.it This test is not yet approved or cleared by the [place] FDA and   has been authorized for detection and/or diagnosis of SARS-CoV-2 by FDA under an Emergency Use Authorization (EUA).  This EUA will remain in effect (meaning this test can be used) for  the duration of  the COVID-19 declaration under Section 564(b)(1) of the Act, 21 U.S.C. section 360bbb-3(b)(1), unless the authorization is terminated or revoked sooner.    Influenza A by PCR NEGATIVE NEGATIVE Final   Influenza B by PCR NEGATIVE NEGATIVE Final    Comment: (NOTE) The Xpert Xpress SARS-CoV-2/FLU/RSV assay is intended as an aid in  the diagnosis of influenza from Nasopharyngeal swab specimens and  should not be used as a sole basis for treatment. Nasal washings and  aspirates are unacceptable for Xpert Xpress SARS-CoV-2/FLU/RSV  testing. Fact Sheet for Patients: PinkCheek.be Fact Sheet for Healthcare Providers: GravelBags.it This test is not yet approved or cleared by the [place] FDA and  has been authorized for detection and/or diagnosis of SARS-CoV-2 by  FDA under an Emergency Use Authorization (EUA). This EUA will remain  in effect (meaning this test can be used) for the duration of the  Covid-19 declaration under Section 564(b)(1) of the Act, 21  U.S.C. section 360bbb-3(b)(1), unless the authorization is  terminated or revoked. Performed at [facility], [address], [place], [place] [address]     RADIOLOGY:  No results found.   CODE STATUS:     Code Status Orders  (From admission, onward)         Start     Ordered   10/06/19 1637  Full code  Continuous     10/06/19 1643        Code Status History    This patient has a current code status but no historical code status.   Advance Care Planning Activity       TOTAL TIME TAKING CARE OF THIS PATIENT: *40* minutes.    Fritzi Mandes M.D  [facility]    CC: Primary care physician; Patient, No Pcp Per

## 2019-10-10 NOTE — Progress Notes (Signed)
Nurse reports 2.1-second pause of heart rhythm per telemetry report.  Events are asymptomatic.  However given her history of atrial fibrillation and beta-blocker therapy she could benefit from event monitoring.  She is followed by [facility] cardiology services planning to follow her up regarding heart rhythm not conducive to do as unable to contact her cardiology practice. Continue to monitor on telemetry.  If warranted inpatient consult for sinus pauses with in-house cardiology.

## 2019-10-10 NOTE — Plan of Care (Signed)
  Problem: Education: Goal: Knowledge of risk factors and measures for prevention of condition will improve Outcome: Completed/Met   Problem: Coping: Goal: Psychosocial and spiritual needs will be supported Outcome: Completed/Met   Problem: Respiratory: Goal: Will maintain a patent airway Outcome: Completed/Met Goal: Complications related to the disease process, condition or treatment will be avoided or minimized Outcome: Completed/Met   Problem: Education: Goal: Knowledge of General Education information will improve Description: Including pain rating scale, medication(s)/side effects and non-pharmacologic comfort measures Outcome: Completed/Met   Problem: Health Behavior/Discharge Planning: Goal: Ability to manage health-related needs will improve Outcome: Completed/Met   Problem: Clinical Measurements: Goal: Ability to maintain clinical measurements within normal limits will improve Outcome: Completed/Met Goal: Will remain free from infection Outcome: Completed/Met Goal: Diagnostic test results will improve Outcome: Completed/Met Goal: Respiratory complications will improve Outcome: Completed/Met Goal: Cardiovascular complication will be avoided Outcome: Completed/Met   Problem: Activity: Goal: Risk for activity intolerance will decrease Outcome: Completed/Met   Problem: Nutrition: Goal: Adequate nutrition will be maintained Outcome: Completed/Met   Problem: Coping: Goal: Level of anxiety will decrease Outcome: Completed/Met   Problem: Elimination: Goal: Will not experience complications related to bowel motility Outcome: Completed/Met Goal: Will not experience complications related to urinary retention Outcome: Completed/Met   Problem: Pain Managment: Goal: General experience of comfort will improve Outcome: Completed/Met   Problem: Safety: Goal: Ability to remain free from injury will improve Outcome: Completed/Met   Problem: Skin Integrity: Goal:  Risk for impaired skin integrity will decrease Outcome: Completed/Met

## 2019-10-10 NOTE — Discharge Instructions (Signed)
Self Quarantine for total  atleast 10 days

## 2019-10-10 NOTE — Progress Notes (Signed)
Pt being discharged home, discharge instructions reviewed with pt, states understanding, pt with no complaints, husband to transport

## 2019-12-05 DIAGNOSIS — I119 Hypertensive heart disease without heart failure: Secondary | ICD-10-CM | POA: Insufficient documentation

## 2020-03-10 IMAGING — DX DG CHEST 1V PORT
1 series · 1 of 1 positions shown · non-contrast
Comparison: None.

CLINICAL DATA: Possible COVID

EXAM:
PORTABLE CHEST 1 VIEW

[chest ap]
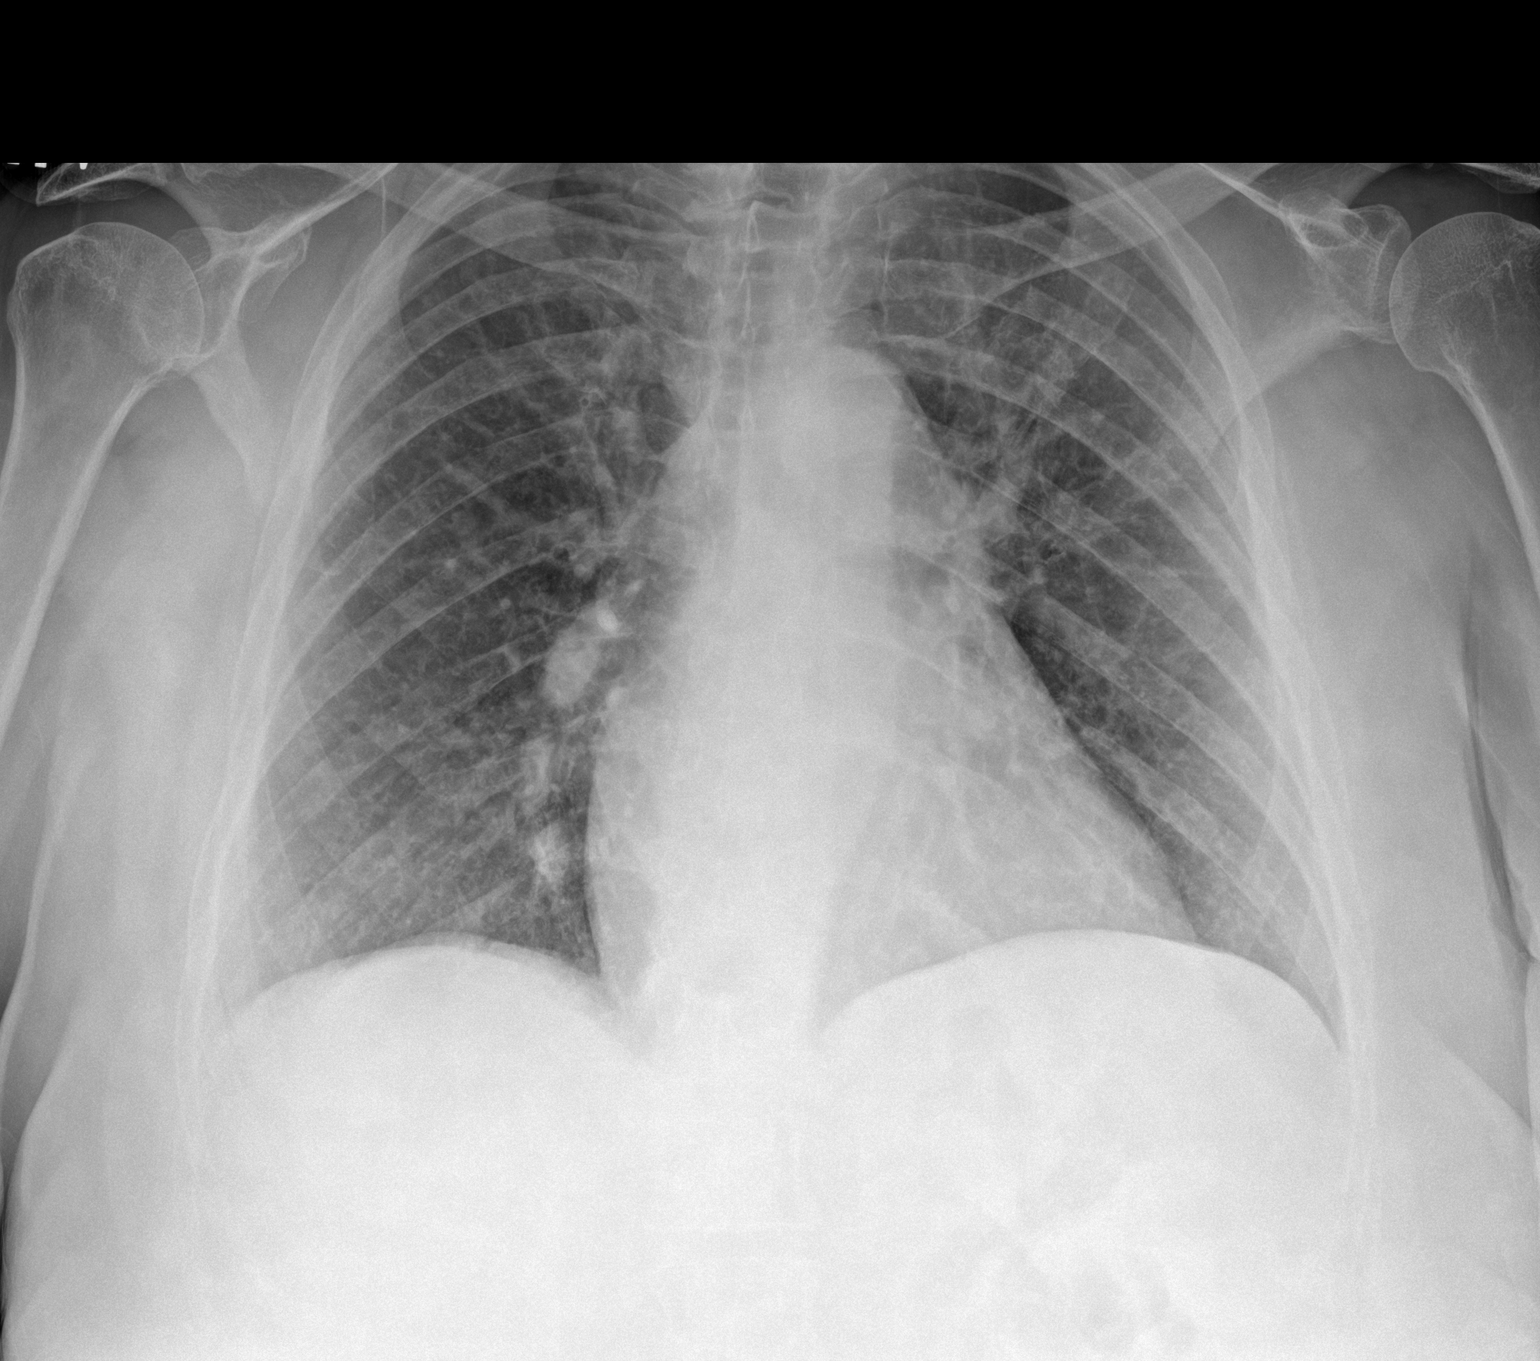

[1 of 1 positions shown; findings below may reference images not displayed]

FINDINGS: The heart size and mediastinal contours are within normal limits.
Mild, diffuse interstitial pulmonary opacity. The visualized
skeletal structures are unremarkable.
IMPRESSION: Mild, diffuse interstitial pulmonary opacity, most consistent with
atypical or viral infection, or alternately pulmonary edema. There
is no focal airspace opacity.

## 2020-11-24 DIAGNOSIS — R7303 Prediabetes: Secondary | ICD-10-CM | POA: Insufficient documentation

## 2022-09-15 ENCOUNTER — Emergency Department: Payer: BC Managed Care – PPO

## 2022-09-15 ENCOUNTER — Inpatient Hospital Stay
Admission: EM | Admit: 2022-09-15 | Discharge: 2022-10-05 | DRG: 438 | Disposition: A | Discharge status: Home / Self Care | Payer: BC Managed Care – PPO | Attending: Student in an Organized Health Care Education/Training Program | Admitting: Student in an Organized Health Care Education/Training Program

## 2022-09-15 ENCOUNTER — Other Ambulatory Visit: Payer: Self-pay

## 2022-09-15 ENCOUNTER — Encounter: Payer: Self-pay | Admitting: Emergency Medicine

## 2022-09-15 DIAGNOSIS — E876 Hypokalemia: Secondary | ICD-10-CM | POA: Diagnosis not present

## 2022-09-15 DIAGNOSIS — F418 Other specified anxiety disorders: Secondary | ICD-10-CM | POA: Diagnosis not present

## 2022-09-15 DIAGNOSIS — R188 Other ascites: Secondary | ICD-10-CM | POA: Diagnosis present

## 2022-09-15 DIAGNOSIS — R7303 Prediabetes: Secondary | ICD-10-CM | POA: Diagnosis present

## 2022-09-15 DIAGNOSIS — I110 Hypertensive heart disease with heart failure: Secondary | ICD-10-CM | POA: Diagnosis present

## 2022-09-15 DIAGNOSIS — K567 Ileus, unspecified: Secondary | ICD-10-CM | POA: Diagnosis not present

## 2022-09-15 DIAGNOSIS — Z8249 Family history of ischemic heart disease and other diseases of the circulatory system: Secondary | ICD-10-CM

## 2022-09-15 DIAGNOSIS — I482 Chronic atrial fibrillation, unspecified: Secondary | ICD-10-CM | POA: Diagnosis present

## 2022-09-15 DIAGNOSIS — R350 Frequency of micturition: Secondary | ICD-10-CM | POA: Diagnosis present

## 2022-09-15 DIAGNOSIS — G928 Other toxic encephalopathy: Secondary | ICD-10-CM | POA: Diagnosis not present

## 2022-09-15 DIAGNOSIS — G4733 Obstructive sleep apnea (adult) (pediatric): Secondary | ICD-10-CM | POA: Diagnosis present

## 2022-09-15 DIAGNOSIS — Z90710 Acquired absence of both cervix and uterus: Secondary | ICD-10-CM

## 2022-09-15 DIAGNOSIS — I340 Nonrheumatic mitral (valve) insufficiency: Secondary | ICD-10-CM | POA: Diagnosis present

## 2022-09-15 DIAGNOSIS — Z6836 Body mass index (BMI) 36.0-36.9, adult: Secondary | ICD-10-CM

## 2022-09-15 DIAGNOSIS — K219 Gastro-esophageal reflux disease without esophagitis: Secondary | ICD-10-CM | POA: Diagnosis present

## 2022-09-15 DIAGNOSIS — I700 Atherosclerosis of aorta: Secondary | ICD-10-CM | POA: Diagnosis present

## 2022-09-15 DIAGNOSIS — I421 Obstructive hypertrophic cardiomyopathy: Secondary | ICD-10-CM | POA: Diagnosis not present

## 2022-09-15 DIAGNOSIS — E785 Hyperlipidemia, unspecified: Secondary | ICD-10-CM | POA: Diagnosis present

## 2022-09-15 DIAGNOSIS — K858 Other acute pancreatitis without necrosis or infection: Secondary | ICD-10-CM | POA: Diagnosis not present

## 2022-09-15 DIAGNOSIS — K828 Other specified diseases of gallbladder: Secondary | ICD-10-CM | POA: Diagnosis present

## 2022-09-15 DIAGNOSIS — K8689 Other specified diseases of pancreas: Secondary | ICD-10-CM

## 2022-09-15 DIAGNOSIS — K8581 Other acute pancreatitis with uninfected necrosis: Secondary | ICD-10-CM | POA: Diagnosis present

## 2022-09-15 DIAGNOSIS — E7220 Disorder of urea cycle metabolism, unspecified: Secondary | ICD-10-CM | POA: Diagnosis not present

## 2022-09-15 DIAGNOSIS — K5792 Diverticulitis of intestine, part unspecified, without perforation or abscess without bleeding: Secondary | ICD-10-CM

## 2022-09-15 DIAGNOSIS — Z8261 Family history of arthritis: Secondary | ICD-10-CM

## 2022-09-15 DIAGNOSIS — E039 Hypothyroidism, unspecified: Secondary | ICD-10-CM | POA: Diagnosis present

## 2022-09-15 DIAGNOSIS — R17 Unspecified jaundice: Secondary | ICD-10-CM | POA: Diagnosis not present

## 2022-09-15 DIAGNOSIS — K859 Acute pancreatitis without necrosis or infection, unspecified: Secondary | ICD-10-CM | POA: Diagnosis not present

## 2022-09-15 DIAGNOSIS — K5732 Diverticulitis of large intestine without perforation or abscess without bleeding: Secondary | ICD-10-CM | POA: Diagnosis not present

## 2022-09-15 DIAGNOSIS — E871 Hypo-osmolality and hyponatremia: Secondary | ICD-10-CM | POA: Diagnosis not present

## 2022-09-15 DIAGNOSIS — G9341 Metabolic encephalopathy: Secondary | ICD-10-CM | POA: Diagnosis not present

## 2022-09-15 DIAGNOSIS — K5720 Diverticulitis of large intestine with perforation and abscess without bleeding: Secondary | ICD-10-CM | POA: Diagnosis not present

## 2022-09-15 DIAGNOSIS — E669 Obesity, unspecified: Secondary | ICD-10-CM | POA: Diagnosis not present

## 2022-09-15 DIAGNOSIS — Z7901 Long term (current) use of anticoagulants: Secondary | ICD-10-CM

## 2022-09-15 DIAGNOSIS — Z87891 Personal history of nicotine dependence: Secondary | ICD-10-CM

## 2022-09-15 DIAGNOSIS — K8501 Idiopathic acute pancreatitis with uninfected necrosis: Secondary | ICD-10-CM | POA: Diagnosis not present

## 2022-09-15 DIAGNOSIS — Z539 Procedure and treatment not carried out, unspecified reason: Secondary | ICD-10-CM | POA: Diagnosis present

## 2022-09-15 DIAGNOSIS — K8591 Acute pancreatitis with uninfected necrosis, unspecified: Secondary | ICD-10-CM | POA: Diagnosis not present

## 2022-09-15 DIAGNOSIS — T402X5A Adverse effect of other opioids, initial encounter: Secondary | ICD-10-CM | POA: Diagnosis not present

## 2022-09-15 DIAGNOSIS — Z79890 Hormone replacement therapy (postmenopausal): Secondary | ICD-10-CM

## 2022-09-15 DIAGNOSIS — E878 Other disorders of electrolyte and fluid balance, not elsewhere classified: Secondary | ICD-10-CM | POA: Diagnosis not present

## 2022-09-15 DIAGNOSIS — Z79899 Other long term (current) drug therapy: Secondary | ICD-10-CM

## 2022-09-15 DIAGNOSIS — Z888 Allergy status to other drugs, medicaments and biological substances status: Secondary | ICD-10-CM

## 2022-09-15 DIAGNOSIS — I4891 Unspecified atrial fibrillation: Secondary | ICD-10-CM | POA: Diagnosis not present

## 2022-09-15 LAB — URINALYSIS, ROUTINE W REFLEX MICROSCOPIC
Bacteria, UA: NONE SEEN
Bilirubin Urine: NEGATIVE
Glucose, UA: NEGATIVE mg/dL
Hgb urine dipstick: NEGATIVE
Ketones, ur: 20 mg/dL — AB
Nitrite: NEGATIVE
Protein, ur: 100 mg/dL — AB
Specific Gravity, Urine: 1.031 — ABNORMAL HIGH (ref 1.005–1.030)
pH: 5 (ref 5.0–8.0)

## 2022-09-15 LAB — COMPREHENSIVE METABOLIC PANEL
ALT: 18 U/L (ref 0–44)
AST: 26 U/L (ref 15–41)
Albumin: 4.1 g/dL (ref 3.5–5.0)
Alkaline Phosphatase: 55 U/L (ref 38–126)
Anion gap: 11 (ref 5–15)
BUN: 27 mg/dL — ABNORMAL HIGH (ref 8–23)
CO2: 23 mmol/L (ref 22–32)
Calcium: 9.4 mg/dL (ref 8.9–10.3)
Chloride: 101 mmol/L (ref 98–111)
Creatinine, Ser: 0.96 mg/dL (ref 0.44–1.00)
GFR, Estimated: >60 mL/min (ref >60)
Glucose, Bld: 163 mg/dL — ABNORMAL HIGH (ref 70–99)
Potassium: 4 mmol/L (ref 3.5–5.1)
Sodium: 135 mmol/L (ref 135–145)
Total Bilirubin: 1.3 mg/dL — ABNORMAL HIGH (ref 0.3–1.2)
Total Protein: 7.9 g/dL (ref 6.5–8.1)

## 2022-09-15 LAB — LIPID PANEL
Cholesterol: 157 mg/dL (ref 0–200)
HDL: 43 mg/dL (ref >40)
LDL Cholesterol: 104 mg/dL — ABNORMAL HIGH (ref 0–99)
Total CHOL/HDL Ratio: 3.7 RATIO
Triglycerides: 49 mg/dL (ref <150)
VLDL: 10 mg/dL (ref 0–40)

## 2022-09-15 LAB — LACTIC ACID, PLASMA
Lactic Acid, Venous: 1.9 mmol/L (ref 0.5–1.9)
Lactic Acid, Venous: 2 mmol/L (ref 0.5–1.9)

## 2022-09-15 LAB — TROPONIN I (HIGH SENSITIVITY)
Troponin I (High Sensitivity): 24 ng/L — ABNORMAL HIGH (ref <18)
Troponin I (High Sensitivity): 24 ng/L — ABNORMAL HIGH (ref <18)

## 2022-09-15 LAB — CBC WITH DIFFERENTIAL/PLATELET
Abs Immature Granulocytes: 0.08 10*3/uL — ABNORMAL HIGH (ref 0.00–0.07)
Basophils Absolute: 0 10*3/uL (ref 0.0–0.1)
Basophils Relative: 0 %
Eosinophils Absolute: 0 10*3/uL (ref 0.0–0.5)
Eosinophils Relative: 0 %
HCT: 47.6 % — ABNORMAL HIGH (ref 36.0–46.0)
Hemoglobin: 15.7 g/dL — ABNORMAL HIGH (ref 12.0–15.0)
Immature Granulocytes: 1 %
Lymphocytes Relative: 8 %
Lymphs Abs: 1.2 10*3/uL (ref 0.7–4.0)
MCH: 28.5 pg (ref 26.0–34.0)
MCHC: 33 g/dL (ref 30.0–36.0)
MCV: 86.4 fL (ref 80.0–100.0)
Monocytes Absolute: 0.6 10*3/uL (ref 0.1–1.0)
Monocytes Relative: 4 %
Neutro Abs: 13.8 10*3/uL — ABNORMAL HIGH (ref 1.7–7.7)
Neutrophils Relative %: 87 %
Platelets: 245 10*3/uL (ref 150–400)
RBC: 5.51 MIL/uL — ABNORMAL HIGH (ref 3.87–5.11)
RDW: 13.6 % (ref 11.5–15.5)
WBC: 15.8 10*3/uL — ABNORMAL HIGH (ref 4.0–10.5)
nRBC: 0 % (ref 0.0–0.2)

## 2022-09-15 LAB — PROTIME-INR
INR: 1.2 (ref 0.8–1.2)
Prothrombin Time: 14.6 seconds (ref 11.4–15.2)

## 2022-09-15 LAB — PROCALCITONIN: Procalcitonin: 0.14 ng/mL

## 2022-09-15 LAB — HIV ANTIBODY (ROUTINE TESTING W REFLEX): HIV Screen 4th Generation wRfx: NONREACTIVE

## 2022-09-15 LAB — APTT: aPTT: 26 seconds (ref 24–36)

## 2022-09-15 LAB — LIPASE, BLOOD: Lipase: 539 U/L — ABNORMAL HIGH (ref 11–51)

## 2022-09-15 MED ORDER — LACTATED RINGERS IV BOLUS
1000.0000 mL | Freq: Once | INTRAVENOUS | Status: AC
  Administered 2022-09-15: 1000 mL via INTRAVENOUS

## 2022-09-15 MED ORDER — POLYETHYLENE GLYCOL 3350 17 G PO PACK
17.0000 g | PACK | Freq: Every day | ORAL | Status: DC
  Administered 2022-09-15 – 2022-09-20 (×5): 17 g via ORAL
  Filled 2022-09-15 (×5): qty 1

## 2022-09-15 MED ORDER — METOPROLOL TARTRATE 50 MG PO TABS
50.0000 mg | ORAL_TABLET | Freq: Two times a day (BID) | ORAL | Status: DC
  Administered 2022-09-15 – 2022-09-21 (×13): 50 mg via ORAL
  Filled 2022-09-15: qty 1
  Filled 2022-09-15: qty 2
  Filled 2022-09-15: qty 1
  Filled 2022-09-15: qty 2
  Filled 2022-09-15 (×9): qty 1

## 2022-09-15 MED ORDER — PANTOPRAZOLE SODIUM 40 MG IV SOLR
40.0000 mg | INTRAVENOUS | Status: DC
  Administered 2022-09-15 – 2022-09-19 (×4): 40 mg via INTRAVENOUS
  Filled 2022-09-15 (×4): qty 10

## 2022-09-15 MED ORDER — APIXABAN 5 MG PO TABS
5.0000 mg | ORAL_TABLET | Freq: Two times a day (BID) | ORAL | Status: DC
  Administered 2022-09-15 – 2022-09-16 (×4): 5 mg via ORAL
  Filled 2022-09-15 (×4): qty 1

## 2022-09-15 MED ORDER — ONDANSETRON HCL 4 MG/2ML IJ SOLN
4.0000 mg | Freq: Four times a day (QID) | INTRAMUSCULAR | Status: DC | PRN
  Administered 2022-09-15 – 2022-10-04 (×9): 4 mg via INTRAVENOUS
  Filled 2022-09-15 (×10): qty 2

## 2022-09-15 MED ORDER — ADULT MULTIVITAMIN W/MINERALS CH
1.0000 | ORAL_TABLET | Freq: Every day | ORAL | Status: DC
  Administered 2022-09-15 – 2022-09-24 (×10): 1 via ORAL
  Filled 2022-09-15 (×11): qty 1

## 2022-09-15 MED ORDER — DILTIAZEM HCL-DEXTROSE 125-5 MG/125ML-% IV SOLN (PREMIX)
5.0000 mg/h | INTRAVENOUS | Status: DC
  Administered 2022-09-15 – 2022-09-16 (×2): 5 mg/h via INTRAVENOUS
  Filled 2022-09-15 (×3): qty 125

## 2022-09-15 MED ORDER — IOHEXOL 300 MG/ML  SOLN
100.0000 mL | Freq: Once | INTRAMUSCULAR | Status: AC | PRN
  Administered 2022-09-15: 100 mL via INTRAVENOUS

## 2022-09-15 MED ORDER — HYDROMORPHONE HCL 1 MG/ML IJ SOLN
1.0000 mg | Freq: Once | INTRAMUSCULAR | Status: AC
  Administered 2022-09-15: 1 mg via INTRAVENOUS
  Filled 2022-09-15: qty 1

## 2022-09-15 MED ORDER — FENTANYL CITRATE PF 50 MCG/ML IJ SOSY
50.0000 ug | PREFILLED_SYRINGE | Freq: Once | INTRAMUSCULAR | Status: AC
  Administered 2022-09-15: 50 ug via INTRAVENOUS
  Filled 2022-09-15: qty 1

## 2022-09-15 MED ORDER — ONDANSETRON HCL 4 MG PO TABS
4.0000 mg | ORAL_TABLET | Freq: Four times a day (QID) | ORAL | Status: DC | PRN
  Filled 2022-09-15: qty 1

## 2022-09-15 MED ORDER — HYDROMORPHONE HCL 1 MG/ML IJ SOLN
1.0000 mg | INTRAMUSCULAR | Status: DC | PRN
  Administered 2022-09-15 – 2022-09-18 (×16): 1 mg via INTRAVENOUS
  Filled 2022-09-15 (×16): qty 1

## 2022-09-15 MED ORDER — DILTIAZEM HCL 25 MG/5ML IV SOLN
20.0000 mg | Freq: Once | INTRAVENOUS | Status: AC
  Administered 2022-09-15: 10 mg via INTRAVENOUS
  Filled 2022-09-15: qty 5

## 2022-09-15 MED ORDER — SODIUM CHLORIDE 0.9 % IV SOLN: INTRAVENOUS | Status: DC

## 2022-09-15 MED ORDER — ONDANSETRON HCL 4 MG/2ML IJ SOLN
4.0000 mg | Freq: Once | INTRAMUSCULAR | Status: AC
  Administered 2022-09-15: 4 mg via INTRAVENOUS
  Filled 2022-09-15: qty 2

## 2022-09-15 MED ORDER — LEVOTHYROXINE SODIUM 25 MCG PO TABS
25.0000 ug | ORAL_TABLET | Freq: Every day | ORAL | Status: DC
  Administered 2022-09-15 – 2022-10-05 (×20): 25 ug via ORAL
  Filled 2022-09-15 (×21): qty 1

## 2022-09-15 MED ORDER — VITAMIN B-12 1000 MCG PO TABS
500.0000 ug | ORAL_TABLET | Freq: Every day | ORAL | Status: DC
  Administered 2022-09-15 – 2022-10-05 (×21): 500 ug via ORAL
  Filled 2022-09-15 (×21): qty 1

## 2022-09-15 MED ORDER — ACETAMINOPHEN 325 MG PO TABS: 650.0000 mg | ORAL_TABLET | Freq: Four times a day (QID) | ORAL | Status: DC | PRN

## 2022-09-15 NOTE — Assessment & Plan Note (Addendum)
Body mass index is 33.45 kg/m. Complicates overall care and prognosis.  Recommend lifestyle modifications including physical activity and diet for weight loss and overall long-term health.

## 2022-09-15 NOTE — ED Notes (Signed)
Troponin re collected and sent to lab

## 2022-09-15 NOTE — Assessment & Plan Note (Addendum)
Acute severe pancreatitis with increased fluid collections since prior CT but no evidence of necrosis on repeat CT (1/26).   On empiric Zosyn - continuing for diverticulitis. General surgery no plan for cholecystectomy IgG4 pending.   The patient does not drink alcohol.  No new medications.  Triglycerides normal.  MRCP shows the possibility of pancreatic divisum which could increase risk of pancreatitis.   Lipase normalized pretty quickly. GI consulted, have signed off. Tube feeds were held for acute diverticulitis with contained perf. Per surgery, resume trickle feeds today. Monitor abdominal exam. Will need close GI follow up for ongoing care of pancreatitis and repeat imaging.

## 2022-09-15 NOTE — Consult Note (Signed)
CARDIOLOGY CONSULT NOTE               Patient ID: Molly Brown MRN: [id number] DOB/AGE: [birth year] 72 y.o.  Admit date: 09/15/2022 Referring Physician Dr. Francine Graven hospitalist Primary Physician  Primary Cardiologist Dr. Nehemiah Massed Reason for Consultation rapid atrial fibrillation [place]  HPI: 72 year old white female history of IHSS chronic atrial fibrillation on anticoagulation presented with heavy history of abdominal discomfort and found to have pericarditis with diffuse abdominal pain nausea vomiting patient has history of obstructive sleep apnea obesity and diffuse abdominal discomfort.  Patient had persistent nausea and vomiting denies any fever chills is 12 point presented with abdominal pain patient is from being up atrial fibrillation with around 140 denies any chest pain states to be compliant with her medication history.  She presented for further evaluation and care.  Review of systems complete and found to be negative unless listed above     Past Medical History:  Diagnosis Date   AF (atrial fibrillation) (HCC)    Anal fissure    Anxiety    Cardiac arrhythmia due to congenital heart disease    Chicken pox    Depression    GERD (gastroesophageal reflux disease)    Heart murmur     Past Surgical History:  Procedure Laterality Date   ABDOMINAL HYSTERECTOMY     AUGMENTATION MAMMAPLASTY Bilateral 1999   BREAST ENHANCEMENT SURGERY  2000   TONSILLECTOMY  1972   TOOTH EXTRACTION      (Not in a hospital admission)  Social History   Socioeconomic History   Marital status: Married    Spouse name: Not on file   Number of children: 3   Years of education: Not on file   Highest education level: Not on file  Occupational History   Occupation: homemaker  Tobacco Use   Smoking status: Former    Types: Cigarettes   Smokeless tobacco: Never  Substance and Sexual Activity   Alcohol use: No   Drug use: No   Sexual activity: Never  Other Topics Concern   Not on file   Social History Narrative   Not on file   Social Determinants of Health   Financial Resource Strain: Not on file  Food Insecurity: Not on file  Transportation Needs: Not on file  Physical Activity: Not on file  Stress: Not on file  Social Connections: Not on file  Intimate Partner Violence: Not on file    Family History  Problem Relation Age of Onset   Arthritis Mother    Heart disease Father    Arthritis Maternal Grandmother    Arthritis Paternal Grandmother    Heart disease Paternal Grandmother    Breast cancer Neg Hx       Review of systems complete and found to be negative unless listed above      PHYSICAL EXAM  General: Well developed, well nourished, in no acute distress HEENT:  Normocephalic and atramatic Neck:  No JVD.  Lungs: Clear bilaterally to auscultation and percussion. Heart: HRRR . Normal S1 and S2 without gallops or murmurs.  Abdomen: Bowel sounds are positive, abdomen soft and non-tender  Msk:  Back normal, normal gait. Normal strength and tone for age. Extremities: No clubbing, cyanosis or edema.   Neuro: Alert and oriented X 3. Psych:  Good affect, responds appropriately  Labs:   Lab Results  Component Value Date   WBC 15.8 (H) 09/15/2022   HGB 15.7 (H) 09/15/2022   HCT 47.6 (H) 09/15/2022   MCV 86.4  09/15/2022   PLT 245 09/15/2022    Recent Labs  Lab 09/15/22 0606  NA 135  K 4.0  CL 101  CO2 23  BUN 27*  CREATININE 0.96  CALCIUM 9.4  PROT 7.9  BILITOT 1.3*  ALKPHOS 55  ALT 18  AST 26  GLUCOSE 163*   No results found for: "CKTOTAL", "CKMB", "CKMBINDEX", "TROPONINI"  Lab Results  Component Value Date   CHOL 233 (H) 02/24/2014   Lab Results  Component Value Date   HDL 44.30 02/24/2014   Lab Results  Component Value Date   LDLCALC 156 (H) 02/24/2014   Lab Results  Component Value Date   TRIG 64 10/06/2019   TRIG 166.0 (H) 02/24/2014   Lab Results  Component Value Date   CHOLHDL 5 02/24/2014   No results found  for: "LDLDIRECT"    Radiology: US ABDOMEN LIMITED RUQ (LIVER/GB)  Result Date: 09/15/2022 CLINICAL DATA:  Pancreatitis. EXAM: ULTRASOUND ABDOMEN LIMITED RIGHT UPPER QUADRANT COMPARISON:  CT abdomen and pelvis 09/15/2022. FINDINGS: Gallbladder: No gallstones or wall thickening visualized. No pericholecystic fluid. The sonographer imaged a tiny focal region of possible ongoing artifact questioning minimal adenomyomatosis in the region of the gallbladder fundus. No sonographic Murphy sign noted by sonographer. Common bile duct: Diameter: 4 mm, within normal limits. No intrahepatic or extrahepatic biliary ductal dilatation. Liver: No focal lesion identified. Within normal limits in parenchymal echogenicity. Minimal perihepatic fluid as seen on today's CT. Portal vein is patent on color Doppler imaging with normal direction of blood flow towards the liver. Other: None. IMPRESSION: 1. Minimal perihepatic fluid as seen on today's CT. 2. No gallstones or sonographic evidence of acute cholecystitis. Electronically Signed   By: Yvonne Kendall M.D.   On: 09/15/2022 10:22   CT ABDOMEN PELVIS W CONTRAST  Result Date: 09/15/2022 CLINICAL DATA:  Generalized abdominal pain with nausea and vomiting since yesterday EXAM: CT ABDOMEN AND PELVIS WITH CONTRAST TECHNIQUE: Multidetector CT imaging of the abdomen and pelvis was performed using the standard protocol following bolus administration of intravenous contrast. RADIATION DOSE REDUCTION: This exam was performed according to the departmental dose-optimization program which includes automated exposure control, adjustment of the mA and/or kV according to patient size and/or use of iterative reconstruction technique. CONTRAST:  14mL OMNIPAQUE IOHEXOL 300 MG/ML  SOLN COMPARISON:  None Available. FINDINGS: Lower chest: Calcifications of the mitral annulus with left atrial dilation. Pulmonary edema. Small hiatal hernia. Partially visualized bilateral breast prostheses.  Hepatobiliary: No suspicious hepatic lesion. Lingular excrescence of the caudate lobe, anatomic variant. Gallbladder is unremarkable. No biliary ductal dilation. Pancreas: Edema of the pancreatic parenchyma with peripancreatic fluid. No evidence of pancreatic necrosis, pancreatic ductal dilation or walled-off collections. Spleen: Splenomegaly or focal splenic lesion. Adrenals/Urinary Tract: Bilateral adrenal glands appear normal. No hydronephrosis. Tiny hypodense bilateral renal lesions are technically too small to accurately characterize but statistically likely to reflect cysts and considered benign requiring no independent imaging follow-up. Stomach/Bowel: Cerebral hiatal hernia. Reactive inflammation of the duodenum. No pathologic dilation of small or large bowel. Normal appendix and terminal ileum. Colonic diverticulosis without findings of acute diverticulitis. Vascular/Lymphatic: Aortic atherosclerosis. Normal caliber abdominal aorta. Smooth IVC contours. Pathologically enlarged abdominal or pelvic lymph nodes. Reproductive: No acute or suspicious finding. Other: Peripancreatic fluid with fluid tracking along the bilateral pericolic gutters into the pelvis and also tracking along the mesenteric root into the small bowel mesentery. No pneumoperitoneum. No portal venous gas. Musculoskeletal: No acute osseous abnormality. Multilevel degenerative changes spine. IMPRESSION: 1. Acute interstitial  pancreatitis. No evidence of pancreatic necrosis, pancreatic ductal dilation or walled-off collections. 2. Calcifications of the mitral annulus with left atrial dilation, consider further evaluation with cardiac echo if not previously performed. 3. Mild pulmonary edema. 4.  Aortic Atherosclerosis (ICD10-I70.0). Electronically Signed   By: Maudry Mayhew M.D.   On: 09/15/2022 08:16   DG Abdomen 1 View  Result Date: 09/15/2022 CLINICAL DATA:  Diffuse pain with emesis. EXAM: ABDOMEN - 1 VIEW COMPARISON:  None Available.  FINDINGS: The IMPRESSION: Negative. Electronically Signed   By: Signa Kell M.D.   On: 09/15/2022 06:40   DG Chest Portable 1 View  Result Date: 09/15/2022 CLINICAL DATA:  Atrial fibrillation.  Chest pain.  Back pain. EXAM: PORTABLE CHEST 1 VIEW COMPARISON:  10/06/2019 FINDINGS: Stable cardiomediastinal contours. Pulmonary vascular congestion identified. No pleural effusion. Atelectasis identified within the left lung base. No airspace consolidation. Visualized osseous structures appear intact. IMPRESSION: 1. Pulmonary vascular congestion. 2. Left base atelectasis. Electronically Signed   By: Signa Kell M.D.   On: 09/15/2022 06:39    EKG: Rapid atrial fibrillation rate of 130 nonspecific ST-T wave changes  ASSESSMENT AND PLAN:  Atrial fibrillation rapid ventricular response History of hypertrophic obstructive cardiomyopathy Acute pancreatitis Abdominal pain Obesity GERD Nausea and vomiting . Plan Reviewed admit for rapid atrial fibrillation recommend rate control continue anticoagulation Continue to follow [place] continue metoprolol respiratory support mild hydration Recommend weight loss exercise portion control Unclear etiology of pancreatitis consider GI input for further evaluation and management Recommend rate control and IV hydration Recommend PPI IV Protonix therapy to help with symptom management and control No invasive cardiac studies recommended at this stage   Signed: Alwyn Pea MD, 09/15/2022, 10:50 AM

## 2022-09-15 NOTE — ED Notes (Signed)
Provider at bedside.

## 2022-09-15 NOTE — Assessment & Plan Note (Signed)
Continue Synthroid °

## 2022-09-15 NOTE — H&P (Signed)
History and Physical    Patient: Molly Brown PZW:[id number] DOB: 12-16-1950 DOA: 09/15/2022 DOS: the patient was seen and examined on 09/15/2022 PCP: Patient, No Pcp Per  Patient coming from: Home  Chief Complaint:  Chief Complaint  Patient presents with   Emesis   HPI: Molly Brown is a 72 y.o. 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 Patient presents for evaluation of sudden onset abdominal pain mostly in the epigastrium, periumbilical area.  She rates her pain a 10 x 10 in intensity at its worst.  Pain is nonradiating and is associated with nausea and multiple episodes of emesis.  She states that she has had issues with abdominal pain in the past usually self-limiting but this is the worst pain she has had which prompted her visit to the emergency room.  She is unable to tell me if her prior episodes of pain related to meals.  She has urinary frequency and has dyspnea with exertion which is chronic.  Her last bowel movement was 2 days prior to this admission.  According to the patient she usually has daily bowel movements She denies having any fever, no chills, no chest pain, no headache, no dizziness, no lightheadedness, no cough, no blurred vision no focal deficit. Abnormal labs include lactic acid of 2.0, lipase 539, troponin 24, white count 15.8 CT scan of abdomen and pelvis shows acute interstitial pancreatitis. No evidence of pancreatic necrosis, pancreatic ductal dilation or walled-off collections. Calcifications of the mitral annulus with left atrial dilation, consider further evaluation with cardiac echo if not previously performed. Mild pulmonary edema. Aortic Atherosclerosis. Twelve-lead EKG reviewed by me shows  rapid A-fib Patient received 2 L IV fluid bolus, Cardizem 20 mg IV push x 1 and is currently on a Cardizem drip   Review of Systems: As mentioned in the history of present illness. All other systems reviewed and are negative. Past Medical History:  Diagnosis Date   AF (atrial fibrillation) (HCC)    Anal fissure    Anxiety    Cardiac arrhythmia due to congenital heart disease    Chicken pox    Depression    GERD (gastroesophageal reflux disease)    Heart murmur    Past Surgical History:  Procedure Laterality Date   ABDOMINAL HYSTERECTOMY     AUGMENTATION MAMMAPLASTY Bilateral 1999   BREAST ENHANCEMENT SURGERY  2000   TONSILLECTOMY  1972   TOOTH EXTRACTION     Social History:  reports that she has quit smoking. Her smoking use included cigarettes. She has never used smokeless tobacco. She reports that she does not drink alcohol and does not use drugs.  Allergies  Allergen Reactions   Eszopiclone Other (See Comments)    Felt really weird   Flecainide Other (See Comments) and Rash    Insomnia and felt drugged    Family History  Problem Relation Age of Onset   Arthritis Mother    Heart disease Father    Arthritis Maternal Grandmother    Arthritis Paternal Grandmother    Heart disease Paternal Grandmother    Breast cancer Neg Hx     Prior to Admission medications   Medication Sig Start Date End Date Taking? Authorizing Provider  acetaminophen (TYLENOL) 325 MG tablet Take 650 mg by mouth every 6 (six)  hours as needed.   Yes [provider]  apixaban (ELIQUIS) 5 MG TABS tablet Take 1 tablet (5 mg total) by mouth 2 (two) times daily. 10/10/19  Yes Enedina Finner, MD  cyanocobalamin 500 MCG tablet Take 500 mcg by mouth daily.   Yes [provider]  levothyroxine (SYNTHROID) 25 MCG tablet Take 1 tablet (25 mcg total) by mouth daily at 6 (six) AM. 10/11/19  Yes Enedina Finner, MD  metoprolol tartrate (LOPRESSOR) 50 MG tablet Take 1 tablet (50 mg total) by mouth 2 (two)  times daily. 10/10/19  Yes Enedina Finner, MD  Multiple Vitamin (MULTIVITAMIN) LIQD Take 5 mLs by mouth daily.   Yes [provider]  omeprazole (PRILOSEC) 40 MG capsule TAKE 1 CAPSULE (40 MG TOTAL) BY MOUTH DAILY. 01/11/16  Yes Rachael Fee, MD  polyethylene glycol [facility] / Ethelene Hal) packet Take 17 g by mouth as needed.   Yes [provider]  tacrolimus (PROTOPIC) 0.1 % ointment APPLY TO AFFECTED AREA(S) TWICE A DAY AS DIRECTED 06/03/21  Yes [provider]  dexamethasone (DECADRON) 6 MG tablet Take 1 tablet (6 mg total) by mouth daily. Patient not taking: Reported on 09/15/2022 10/11/19   Enedina Finner, MD  Probiotic Product (PROBIOTIC DAILY) CAPS Take by mouth. Patient not taking: Reported on 09/15/2022    [provider]    Physical Exam: Vitals:   09/15/22 0625 09/15/22 0806 09/15/22 0920 09/15/22 0938  BP: 110/75 136/73 (!) 115/104   Pulse: (!) 110 100 (!) 105 (!) 111  Resp: (!) 28 (!) 24 17 17   Temp:  97.8 F (36.6 C)    TempSrc:  Oral    SpO2: 95% 100%    Weight:      Height:       Physical Exam Vitals and nursing note reviewed.  Constitutional:      Appearance: She is obese.     Comments: Appears uncomfortable  HENT:     Head: Normocephalic and atraumatic.     Nose: Nose normal.     Mouth/Throat:     Mouth: Mucous membranes are dry.  Eyes:     Conjunctiva/sclera: Conjunctivae normal.  Cardiovascular:     Rate and Rhythm: Tachycardia present. Rhythm irregular.  Pulmonary:     Effort: Pulmonary effort is normal.     Breath sounds: Normal breath sounds.  Abdominal:     General: Bowel sounds are normal.     Palpations: Abdomen is soft.     Tenderness: There is abdominal tenderness.     Comments: Tender in the epigastrium, periumbilical and right upper quadrant  Musculoskeletal:        General: Normal range of motion.     Cervical back: Normal range of motion and neck supple.  Skin:    General: Skin is warm and dry.  Neurological:      General: No focal deficit present.     Mental Status: She is alert.  Psychiatric:        Mood and Affect: Mood normal.        Behavior: Behavior normal.     Data Reviewed: Relevant notes from primary care and specialist visits, past discharge summaries as available in EHR, including Care Everywhere. Prior diagnostic testing as pertinent to current admission diagnoses Updated medications and problem lists for reconciliation ED course, including vitals, labs, imaging, treatment and response to treatment Triage notes, nursing and pharmacy notes and ED provider's notes Notable results as noted in HPI Labs reviewed.  Troponin is  flat at 24, procalcitonin 0.14, lactic acid 2.0 >> 1.9, lipase 539, sodium 135, potassium 4.0, chloride 101, bicarb 23, glucose 163, BUN 27, creatinine 0.96, calcium 9.4, total protein 7.9, albumin 4.1, AST 26, ALT 18, alkaline phosphatase 55, total bilirubin 1.3, white count 15.8, hemoglobin 15.7, hematocrit 47.6, platelet count 245 Chest x-ray reviewed by me shows pulmonary vascular congestion. Left base atelectasis There are no new results to review at this time.  Assessment and Plan: * Atrial fibrillation with rapid ventricular response ([place]) Patient with a history of chronic A-fib who presents to the ER for evaluation of abdominal pain and noted to be in A-fib with a rapid ventricular rate. Received a dose of Cardizem 20 mg IV push and is currently on a Cardizem drip Resume Lopressor and attempt to wean patient off Cardizem drip Continue apixaban as primary prophylaxis for an acute stroke Cardiology consult  Acute pancreatitis Patient presents for evaluation of abdominal pain mostly in the epigastrium and periumbilical area associated with nausea and multiple episodes of emesis. Noted to have elevated lipase levels CT scan of abdomen and pelvis shows acute interstitial pancreatitis. No evidence of pancreatic necrosis, pancreatic ductal dilation or  walled-off collections. Unclear etiology for patient's acute pancreatitis.  Denies any history of alcohol use. Obtain lipid panel to rule out hypertriglyceridemia, obtain right upper quadrant ultrasound Supportive care with n.p.o., IV fluid hydration, pain control and antiemetics.  Obesity (BMI 30-39.9) BMI 16.10 kg/m2 Complicates overall prognosis and care Life style modification and exercise has been discussed with patient in detail   HOCM (hypertrophic obstructive cardiomyopathy) ([place]) Patient with a history of HOCM Monitor respiratory status closely while on IV fluids Continue metoprolol Cardiology consult  Hypothyroidism Continue Synthroid  GERD (gastroesophageal reflux disease) Continue IV PPI      Advance Care Planning:   Code Status: Full Code   Consults: Cardiology  Family Communication: Greater than 50% of time was spent discussing patient's condition and plan of care with her and her husband at the bedside.  All questions and concerns have been addressed with patient and her husband in detail.  They verbalized understanding and agree with the plan.  Severity of Illness: The appropriate patient status for this patient is INPATIENT. Inpatient status is judged to be reasonable and necessary in order to provide the required intensity of service to ensure the patient's safety. The patient's presenting symptoms, physical exam findings, and initial radiographic and laboratory data in the context of their chronic comorbidities is felt to place them at high risk for further clinical deterioration. Furthermore, it is not anticipated that the patient will be medically stable for discharge from the hospital within 2 midnights of admission.   * I certify that at the point of admission it is my clinical judgment that the patient will require inpatient hospital care spanning beyond 2 midnights from the point of admission due to high intensity of service, high risk for further  deterioration and high frequency of surveillance required.*  Author: Collier Bullock, MD 09/15/2022 10:14 AM  For on call review [email or web address].

## 2022-09-15 NOTE — ED Notes (Signed)
Provider notified of patients pain. Pt did state she does not need any pain medication right now, but that she has not slept. Lights dimmed for comfort and provider notified

## 2022-09-15 NOTE — ED Notes (Signed)
RN called lab. Lab states they do not have the troponin on the pt. RN informed Lab that it was sent in the same tube with the lactic acid and the urine. Lab states they do not have it

## 2022-09-15 NOTE — ED Notes (Addendum)
Pt states coming in with abdominal pain for the last 2 days. Pt states she has not had a BM in the last 2 days either. Pt is alert and laying in bed. Pt repositioned. Pt on supplemental oxygen, that she does not normally wear at home. Pt aware we need a urine sample.

## 2022-09-15 NOTE — Assessment & Plan Note (Addendum)
Chronic in nature.  Continue metoprolol and Cardizem CD.  Currently on heparin drip.

## 2022-09-15 NOTE — Assessment & Plan Note (Addendum)
Patient with a history of HOCM Continue metoprolol.

## 2022-09-15 NOTE — ED Provider Notes (Signed)
[facility] Provider Note    Event Date/Time   First MD Initiated Contact with Patient 09/15/22 [phone]     (approximate)   History   Emesis   HPI  Molly Brown is a 72 y.o. female who presents to the ED for evaluation of Emesis   I reviewed cardiology clinic visit from 11/16.  History of chronic atrial fibrillation on Eliquis.  HOCM.   Patient presents to the ED with her husband for evaluation of 20-24 hours of pain.  She reports pain to her midsection, epigastrium, lower chest as well as her lower abdomen, sometimes radiating to her mid and lower back.  Developing nausea and emesis subsequently, nonbloody nonbilious emesis.  No bowel movement for 2 days, which is unusual for her.  Urinary frequency is present.  Dyspnea with pain, but denies any cough or upper chest pain.  No fevers.  No syncope.  She thought the pain would go away and waited until this morning.    Physical Exam   Triage Vital Signs: ED Triage Vitals  Enc Vitals Group     BP      Pulse      Resp      Temp      Temp src      SpO2      Weight      Height      Head Circumference      Peak Flow      Pain Score      Pain Loc      Pain Edu?      Excl. in GC?     Most recent vital signs: Vitals:   09/15/22 0546 09/15/22 0625  BP: (!) 147/72 110/75  Pulse: (!) 126 (!) 110  Resp: 20 (!) 28  Temp: 97.7 F (36.5 C)   SpO2: 100% 95%    General: Awake.  Obviously uncomfortable.  Writhing, difficulty sitting still, moaning CV:  Good peripheral perfusion.  Tachycardic and irregular Resp:  Normal effort.  Tachypneic to 25-30 Abd:  No distention.  Tender throughout and voluntary guarding throughout MSK:  No deformity noted.  Neuro:  No focal deficits appreciated. Other:     ED Results / Procedures / Treatments   Labs (all labs ordered are listed, but only abnormal results are displayed) Labs Reviewed  CBC WITH DIFFERENTIAL/PLATELET - Abnormal; Notable for the  following components:      Result Value   WBC 15.8 (*)    RBC 5.51 (*)    Hemoglobin 15.7 (*)    HCT 47.6 (*)    Neutro Abs 13.8 (*)    Abs Immature Granulocytes 0.08 (*)    All other components within normal limits  LACTIC ACID, PLASMA - Abnormal; Notable for the following components:   Lactic Acid, Venous 2.0 (*)    All other components within normal limits  CULTURE, BLOOD (ROUTINE X 2)  CULTURE, BLOOD (ROUTINE X 2)  PROTIME-INR  APTT  COMPREHENSIVE METABOLIC PANEL  LIPASE, BLOOD  URINALYSIS, ROUTINE W REFLEX MICROSCOPIC  LACTIC ACID, PLASMA  PROCALCITONIN  TROPONIN I (HIGH SENSITIVITY)    EKG   RADIOLOGY 1 view CXR interpreted by me with pulm vascular congestion without clear infiltrate or PTX KUB interpreted by me without evidence of acute obstructive pathology  Official radiology report(s): DG Abdomen 1 View  Result Date: 09/15/2022 CLINICAL DATA:  Diffuse pain with emesis. EXAM: ABDOMEN - 1 VIEW COMPARISON:  None Available. FINDINGS: The IMPRESSION: Negative. Electronically Signed  By: Kerby Moors M.D.   On: 09/15/2022 06:40   DG Chest Portable 1 View  Result Date: 09/15/2022 CLINICAL DATA:  Atrial fibrillation.  Chest pain.  Back pain. EXAM: PORTABLE CHEST 1 VIEW COMPARISON:  10/06/2019 FINDINGS: Stable cardiomediastinal contours. Pulmonary vascular congestion identified. No pleural effusion. Atelectasis identified within the left lung base. No airspace consolidation. Visualized osseous structures appear intact. IMPRESSION: 1. Pulmonary vascular congestion. 2. Left base atelectasis. Electronically Signed   By: Kerby Moors M.D.   On: 09/15/2022 06:39    PROCEDURES and INTERVENTIONS:  .1-3 Lead EKG Interpretation  Performed by: Vladimir Crofts, MD Authorized by: Vladimir Crofts, MD     Interpretation: abnormal     ECG rate:  126   ECG rate assessment: tachycardic     Rhythm: atrial fibrillation     Ectopy: none     Conduction: normal   .Critical  Care  Performed by: Vladimir Crofts, MD Authorized by: Vladimir Crofts, MD   Critical care provider statement:    Critical care time (minutes):  30   Critical care time was exclusive of:  Separately billable procedures and treating other patients   Critical care was necessary to treat or prevent imminent or life-threatening deterioration of the following conditions:  Sepsis   Critical care was time spent personally by me on the following activities:  Development of treatment plan with patient or surrogate, discussions with consultants, evaluation of patient's response to treatment, examination of patient, ordering and review of laboratory studies, ordering and review of radiographic studies, ordering and performing treatments and interventions, pulse oximetry, re-evaluation of patient's condition and review of old charts   Medications  fentaNYL (SUBLIMAZE) injection 50 mcg (has no administration in time range)  HYDROmorphone (DILAUDID) injection 1 mg (1 mg Intravenous Given 09/15/22 0606)  lactated ringers bolus 1,000 mL (1,000 mLs Intravenous New Bag/Given 09/15/22 0617)  ondansetron (ZOFRAN) injection 4 mg (4 mg Intravenous Given 09/15/22 0606)  diltiazem (CARDIZEM) injection 20 mg (10 mg Intravenous Given 09/15/22 0610)  lactated ringers bolus 1,000 mL (1,000 mLs Intravenous New Bag/Given 09/15/22 0626)     IMPRESSION / MDM / [place] / ED COURSE  I reviewed the triage vital signs and the nursing notes.  Differential diagnosis includes, but is not limited to, ACS, PE, dissection, sepsis, pyelonephritis, pneumothorax  {Patient presents with symptoms of an acute illness or injury that is potentially life-threatening.  72 year old woman presents to the ED with acute pain.  She is hemodynamically stable, but in A-fib with RVR, tachypneic.  I suspect these are secondary to her underlying physiologic stressor, more likely intra-abdominal considering her exam.  Her EKG shows some rate related  ischemic features, but no STEMI.  I consult with cardiology who agrees.  We initiate resuscitation and provide analgesia.  Initial blood work with leukocytosis concerning for possible SIRS or sepsis criteria but no clear source at this point.  Considering the severity and nature of her pain, CTA dissection study is ordered and pending at time of signout.  Anticipate patient will require admission.  Clinical Course as of 09/15/22 0713  Thu Sep 15, 2022  0607 I consult with Dr. Clayborn Bigness and text him pictures of EKG from today and 2016 comparison [DS]  0618 Callback ,and I also send another one with slower rate in the 80s. He doesn't think it meets criteria [DS]  0625 Reassess during XR [DS]  0706 reassessed [DS]    Clinical Course User Index [DS] Vladimir Crofts, MD  FINAL CLINICAL IMPRESSION(S) / ED DIAGNOSES   Final diagnoses:  Generalized abdominal pain  Other chest pain  Atrial fibrillation with RVR ([place])     Rx / DC Orders   ED Discharge Orders     None        Note:  This document was prepared using Dragon voice recognition software and may include unintentional dictation errors.   Vladimir Crofts, MD 09/15/22 [phone]

## 2022-09-15 NOTE — Assessment & Plan Note (Addendum)
Continue PPI

## 2022-09-15 NOTE — ED Triage Notes (Signed)
Pt to triage via w/c, appears uncomfortable, grimacing; c/o generalized abd pain accomp by N/V since yesterday

## 2022-09-15 NOTE — ED Notes (Signed)
Lab called back and stated the times for the repeat blood work has been adjusted in their system, but can take some time to adjust in [facility]

## 2022-09-15 NOTE — ED Provider Notes (Signed)
Patient signed out to me pending labs and imaging.  In brief this is a 72 year old female with history of atrial fibrillation hokum who presents with a day of abdominal pain is diffuse associate with vomiting and constipation.  Patient tachycardic in A-fib with RVR with some rate related changes on EKG.  Patient given fluid bolus and dose of diltiazem as well as hydromorphone.  Labs preliminarily showing leukocytosis.  Patient's labs are also notable for elevated lipase, lactate of 2, T. bili of 1.3 otherwise reassuring CMP.  On my evaluation patient does have diffuse abdominal tenderness.  She is pending a CT of the abdomen pelvis with contrast.  Anticipate admission for pain control at the least.  Patient CT of the abdomen pelvis is consistent with pancreatitis.  No obvious gallstones noted on CT will obtain a right upper quadrant ultrasound.  Has no history of pancreatitis and denies heavy alcohol use.  Patient's heart rates jumping between 120s to 130s.  Will start on dill drip.  There is some mention of some mild pulmonary edema on chest x-ray.  Patient currently on 2 L nasal cannula but saturating 99% so we will attempt to titrate off.  Patient's UA with 10-20 white blood cells 0 squames.  Tells me that she chronically has some urinary urgency and frequency but no dysuria denies any new urinary symptoms.  Do not feel that this represents true UTI.   Rada Hay, MD 09/15/22 [phone]

## 2022-09-15 NOTE — ED Notes (Signed)
When reviewing labs for the repeat lactic acid and the Troponin, this RN noted the time they are in the chart are at similar times before this RN started his shift. This RN called lab to inquire why the times are not the correct times of when I sent them. Lab stated they pulled the tub and saw the time that I wrote on the label as the time I collected them, and are looking into seeing if they can adjust the time in EPIC.

## 2022-09-16 ENCOUNTER — Encounter: Payer: Self-pay | Admitting: Internal Medicine

## 2022-09-16 DIAGNOSIS — K219 Gastro-esophageal reflux disease without esophagitis: Secondary | ICD-10-CM

## 2022-09-16 DIAGNOSIS — E669 Obesity, unspecified: Secondary | ICD-10-CM | POA: Diagnosis not present

## 2022-09-16 DIAGNOSIS — E039 Hypothyroidism, unspecified: Secondary | ICD-10-CM

## 2022-09-16 DIAGNOSIS — K859 Acute pancreatitis without necrosis or infection, unspecified: Secondary | ICD-10-CM | POA: Diagnosis not present

## 2022-09-16 DIAGNOSIS — I4891 Unspecified atrial fibrillation: Secondary | ICD-10-CM | POA: Diagnosis not present

## 2022-09-16 DIAGNOSIS — I421 Obstructive hypertrophic cardiomyopathy: Secondary | ICD-10-CM

## 2022-09-16 LAB — BASIC METABOLIC PANEL
Anion gap: 5 (ref 5–15)
BUN: 23 mg/dL (ref 8–23)
CO2: 25 mmol/L (ref 22–32)
Calcium: 8.5 mg/dL — ABNORMAL LOW (ref 8.9–10.3)
Chloride: 103 mmol/L (ref 98–111)
Creatinine, Ser: 1 mg/dL (ref 0.44–1.00)
GFR, Estimated: >60 mL/min (ref >60)
Glucose, Bld: 140 mg/dL — ABNORMAL HIGH (ref 70–99)
Potassium: 4.5 mmol/L (ref 3.5–5.1)
Sodium: 133 mmol/L — ABNORMAL LOW (ref 135–145)

## 2022-09-16 LAB — CBC
HCT: 42.9 % (ref 36.0–46.0)
Hemoglobin: 14 g/dL (ref 12.0–15.0)
MCH: 28.9 pg (ref 26.0–34.0)
MCHC: 32.6 g/dL (ref 30.0–36.0)
MCV: 88.6 fL (ref 80.0–100.0)
Platelets: 220 10*3/uL (ref 150–400)
RBC: 4.84 MIL/uL (ref 3.87–5.11)
RDW: 14.2 % (ref 11.5–15.5)
WBC: 19.4 10*3/uL — ABNORMAL HIGH (ref 4.0–10.5)
nRBC: 0 % (ref 0.0–0.2)

## 2022-09-16 LAB — PROCALCITONIN: Procalcitonin: 0.61 ng/mL

## 2022-09-16 MED ORDER — DILTIAZEM HCL ER COATED BEADS 180 MG PO CP24
180.0000 mg | ORAL_CAPSULE | Freq: Every day | ORAL | Status: DC
  Administered 2022-09-16 – 2022-10-05 (×19): 180 mg via ORAL
  Filled 2022-09-16 (×19): qty 1

## 2022-09-16 MED ORDER — OXYCODONE HCL 5 MG PO TABS
5.0000 mg | ORAL_TABLET | ORAL | Status: DC | PRN
  Administered 2022-09-16 – 2022-10-05 (×70): 5 mg via ORAL
  Filled 2022-09-16 (×71): qty 1

## 2022-09-16 MED ORDER — PIPERACILLIN-TAZOBACTAM 3.375 G IVPB
3.3750 g | Freq: Three times a day (TID) | INTRAVENOUS | Status: AC
  Administered 2022-09-16 – 2022-09-21 (×15): 3.375 g via INTRAVENOUS
  Filled 2022-09-16 (×16): qty 50

## 2022-09-16 MED ORDER — SODIUM CHLORIDE 0.9 % IV SOLN: INTRAVENOUS | Status: DC

## 2022-09-16 NOTE — ED Notes (Signed)
Assumed care from Hala,RN. Pt resting comfortably in bed at this time. Pt denies any current needs or questions. Call light with in reach.   

## 2022-09-16 NOTE — ED Notes (Signed)
Per MD Wieting ok to give metoprolol to try to get pt off diltiazem drip.

## 2022-09-16 NOTE — ED Notes (Signed)
Pt requesting to get cleaned up. Pt given wipes to wipe herself down and pt sheets replaced with clean sheets. Pt currently sitting up in recliner with husband at this time.

## 2022-09-16 NOTE — ED Notes (Signed)
RN at bedside, pt IV red around site and bleeding around site. RN removed IV and applied pressure. RN placed new IV.

## 2022-09-16 NOTE — ED Notes (Signed)
Per MD Wieting ok to stop diltiazem IV and start PO dilt.

## 2022-09-16 NOTE — Progress Notes (Signed)
[facility]    SUBJECTIVE: Patient resting comfortably in bed denies any palpitations tachycardia no chest pain still has some abdominal pain but is somewhat improved since yesterday   Vitals:   09/16/22 0345 09/16/22 0400 09/16/22 0430 09/16/22 0500  BP:  115/73 131/61 121/66  Pulse: 95 94 92 92  Resp: 14  20 20   Temp:      TempSrc:      SpO2: 92% 94% 93% 95%  Weight:      Height:         Intake/Output Summary (Last 24 hours) at 09/16/2022 0657 Last data filed at 09/15/2022 1752 Gross per 24 hour  Intake 433.72 ml  Output --  Net 433.72 ml      PHYSICAL EXAM  General: Well developed, well nourished, in no acute distress HEENT:  Normocephalic and atramatic Neck:  No JVD.  Lungs: Clear bilaterally to auscultation and percussion. Heart: HRRR . Normal S1 and S2 without gallops or murmurs.  Abdomen: Bowel sounds are positive, abdomen soft and non-tender  Msk:  Back normal, normal gait. Normal strength and tone for age. Extremities: No clubbing, cyanosis or edema.   Neuro: Alert and oriented X 3. Psych:  Good affect, responds appropriately   LABS: Basic Metabolic Panel: Recent Labs    09/15/22 0606 09/16/22 0429  NA 135 133*  K 4.0 4.5  CL 101 103  CO2 23 25  GLUCOSE 163* 140*  BUN 27* 23  CREATININE 0.96 1.00  CALCIUM 9.4 8.5*   Liver Function Tests: Recent Labs    09/15/22 0606  AST 26  ALT 18  ALKPHOS 55  BILITOT 1.3*  PROT 7.9  ALBUMIN 4.1   Recent Labs    09/15/22 0606  LIPASE 539*   CBC: Recent Labs    09/15/22 0606 09/16/22 0429  WBC 15.8* 19.4*  NEUTROABS 13.8*  --   HGB 15.7* 14.0  HCT 47.6* 42.9  MCV 86.4 88.6  PLT 245 220   Cardiac Enzymes: No results for input(s): "CKTOTAL", "CKMB", "CKMBINDEX", "TROPONINI" in the last 72 hours. BNP: Invalid input(s): "POCBNP" D-Dimer: No results for input(s): "DDIMER" in the last 72 hours. Hemoglobin A1C: No results for input(s): "HGBA1C" in the last 72 hours. Fasting Lipid  Panel: Recent Labs    09/15/22 1050  CHOL 157  HDL 43  LDLCALC 104*  TRIG 49  CHOLHDL 3.7   Thyroid Function Tests: No results for input(s): "TSH", "T4TOTAL", "T3FREE", "THYROIDAB" in the last 72 hours.  Invalid input(s): "FREET3" Anemia Panel: No results for input(s): "VITAMINB12", "FOLATE", "FERRITIN", "TIBC", "IRON", "RETICCTPCT" in the last 72 hours.  US ABDOMEN LIMITED RUQ (LIVER/GB)  Result Date: 09/15/2022 CLINICAL DATA:  Pancreatitis. EXAM: ULTRASOUND ABDOMEN LIMITED RIGHT UPPER QUADRANT COMPARISON:  CT abdomen and pelvis 09/15/2022. FINDINGS: Gallbladder: No gallstones or wall thickening visualized. No pericholecystic fluid. The sonographer imaged a tiny focal region of possible ongoing artifact questioning minimal adenomyomatosis in the region of the gallbladder fundus. No sonographic Murphy sign noted by sonographer. Common bile duct: Diameter: 4 mm, within normal limits. No intrahepatic or extrahepatic biliary ductal dilatation. Liver: No focal lesion identified. Within normal limits in parenchymal echogenicity. Minimal perihepatic fluid as seen on today's CT. Portal vein is patent on color Doppler imaging with normal direction of blood flow towards the liver. Other: None. IMPRESSION: 1. Minimal perihepatic fluid as seen on today's CT. 2. No gallstones or sonographic evidence of acute cholecystitis. Electronically Signed   By: Yvonne Kendall M.D.   On: 09/15/2022  10:22   CT ABDOMEN PELVIS W CONTRAST  Result Date: 09/15/2022 CLINICAL DATA:  Generalized abdominal pain with nausea and vomiting since yesterday EXAM: CT ABDOMEN AND PELVIS WITH CONTRAST TECHNIQUE: Multidetector CT imaging of the abdomen and pelvis was performed using the standard protocol following bolus administration of intravenous contrast. RADIATION DOSE REDUCTION: This exam was performed according to the departmental dose-optimization program which includes automated exposure control, adjustment of the mA and/or kV  according to patient size and/or use of iterative reconstruction technique. CONTRAST:  OMNIPAQUE IOHEXOL 300 MG/ML  SOLN COMPARISON:  None Available. FINDINGS: Lower chest: Calcifications of the mitral annulus with left atrial dilation. Pulmonary edema. Small hiatal hernia. Partially visualized bilateral breast prostheses. Hepatobiliary: No suspicious hepatic lesion. Lingular excrescence of the caudate lobe, anatomic variant. Gallbladder is unremarkable. No biliary ductal dilation. Pancreas: Edema of the pancreatic parenchyma with peripancreatic fluid. No evidence of pancreatic necrosis, pancreatic ductal dilation or walled-off collections. Spleen: Splenomegaly or focal splenic lesion. Adrenals/Urinary Tract: Bilateral adrenal glands appear normal. No hydronephrosis. Tiny hypodense bilateral renal lesions are technically too small to accurately characterize but statistically likely to reflect cysts and considered benign requiring no independent imaging follow-up. Stomach/Bowel: Cerebral hiatal hernia. Reactive inflammation of the duodenum. No pathologic dilation of small or large bowel. Normal appendix and terminal ileum. Colonic diverticulosis without findings of acute diverticulitis. Vascular/Lymphatic: Aortic atherosclerosis. Normal caliber abdominal aorta. Smooth IVC contours. Pathologically enlarged abdominal or pelvic lymph nodes. Reproductive: No acute or suspicious finding. Other: Peripancreatic fluid with fluid tracking along the bilateral pericolic gutters into the pelvis and also tracking along the mesenteric root into the small bowel mesentery. No pneumoperitoneum. No portal venous gas. Musculoskeletal: No acute osseous abnormality. Multilevel degenerative changes spine. IMPRESSION: 1. Acute interstitial pancreatitis. No evidence of pancreatic necrosis, pancreatic ductal dilation or walled-off collections. 2. Calcifications of the mitral annulus with left atrial dilation, consider further  evaluation with cardiac echo if not previously performed. 3. Mild pulmonary edema. 4.  Aortic Atherosclerosis (ICD10-I70.0). Electronically Signed   By: Maudry Mayhew M.D.   On: 09/15/2022 08:16   DG Abdomen 1 View  Result Date: 09/15/2022 CLINICAL DATA:  Diffuse pain with emesis. EXAM: ABDOMEN - 1 VIEW COMPARISON:  None Available. FINDINGS: The IMPRESSION: Negative. Electronically Signed   By: Signa Kell M.D.   On: 09/15/2022 06:40   DG Chest Portable 1 View  Result Date: 09/15/2022 CLINICAL DATA:  Atrial fibrillation.  Chest pain.  Back pain. EXAM: PORTABLE CHEST 1 VIEW COMPARISON:  10/06/2019 FINDINGS: Stable cardiomediastinal contours. Pulmonary vascular congestion identified. No pleural effusion. Atelectasis identified within the left lung base. No airspace consolidation. Visualized osseous structures appear intact. IMPRESSION: 1. Pulmonary vascular congestion. 2. Left base atelectasis. Electronically Signed   By: Signa Kell M.D.   On: 09/15/2022 06:39     Echo reported normal left ventricular function with HOCM  TELEMETRY: Atrial fibrillation rate controlled in the 70s:  ASSESSMENT AND PLAN:  Principal Problem:   Atrial fibrillation with rapid ventricular response (HCC) Active Problems:   GERD (gastroesophageal reflux disease)   Hypothyroidism   Acute pancreatitis   HOCM (hypertrophic obstructive cardiomyopathy) (HCC)   Obesity (BMI 30-39.9)   Plan Chronic atrial fibrillation rapid ventricular response now rate control seems to be doing reasonably improved on rate control GERD continue reflux type therapy Pancreatitis acute unclear etiology continue GI evaluation and management Abdominal pain possibly related to pancreatitis History of HOCM treated medically denies any recent symptoms Obesity modest recommend weight loss exercise portion  control Recommend conservative cardiac input at this stage.  Recommend transition to p.o. from [place],  MD 09/16/2022 6:57 AM

## 2022-09-16 NOTE — Hospital Course (Addendum)
72 y.o. 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 Patient presents for evaluation of sudden onset abdominal pain mostly in the epigastrium, periumbilical area.  She rates her pain a 10 x 10 in intensity at its worst.  Pain is nonradiating and is associated with nausea and multiple episodes of emesis.  She states that she has had issues with abdominal pain in the past usually self-limiting but this is the worst pain she has had which prompted her visit to the emergency room.  She is unable to tell me if her prior episodes of pain related to meals.  She has urinary frequency and has dyspnea with exertion which is chronic.  Her last bowel movement was 2 days prior to this admission.  According to the patient she usually has daily bowel movements She denies having any fever, no chills, no chest pain, no headache, no dizziness, no lightheadedness, no cough, no blurred vision no focal deficit. Abnormal labs include lactic acid of 2.0, lipase 539, troponin 24, white count 15.8 CT scan of abdomen and pelvis shows acute interstitial pancreatitis. No evidence of pancreatic necrosis, pancreatic ductal dilation or walled-off collections. Calcifications of the mitral annulus with left atrial dilation, consider further evaluation with cardiac echo if not previously performed. Mild pulmonary edema. Aortic Atherosclerosis. Twelve-lead EKG reviewed by me shows rapid A-fib Patient received 2 L IV fluid bolus, Cardizem 20 mg IV push x 1 and is currently on a Cardizem drip.  1/19.  Tapered off Cardizem drip and on oral metoprolol and oral Cardizem CD.  Started empiric antibiotics for pancreatitis since procalcitonin elevated and leukocytosis. 1/20.   Consulted general surgery with ?gallstone pancreatitis.  Surgery ordered for a repeat CAT scan.  Holding Eliquis and started heparin drip just in case surgery needed. 1/21.  Patient pulled out her IV and wanted to go home.  Patient was reoriented and then agreeable to stay and to surgery if needed.  Advanced diet to full liquids. 1/22.  Bilirubin rising, MRCP showed acute interstitial edematous pancreatitis with peripancreatic fluid collections with some signs of hemorrhagic or proteinaceous change.  Some peripancreatic necrosis seen.  Pancreatic ascites.  Rather striking tapered narrowing of the mid common bile duct with biliary enhancement.  Dilated main pancreatic duct.  Findings may reflect pancreatic divisum or dominant dorsal drainage via minor papilla. 1/23.  Dobbhoff tube had to be advanced twice.  Last x-ray showing in the duodenal bulb.  Okay to start tube feedings.  1/24: further K replacement.  Mental status worse again today.  Tube feeds running and appears to be tolerating okay.  Stopped antibiotics to monitor. 1/25: pt sleeping a lot. Fever 100.4 F last night, recurrent leukocytosis also.  Resumed Zosyn.  Replaced phos.  Sodium declining slowly. Cardiology gave IV Lasix. 1/26: repeated CT, has more pancreatic fluid collections but no necrosis. New finding of acute diverticulitis with contained perforation. Surgery re-consulted.  Starting TPN and continuing antibiotics 1/27 >> 1/30:  On TPN, IV antibiotics.  Repeat CT slightly larger ?early abscess / fluid collection. Remains NPO. 1/31: surgery resuming Dobhoff trickle feeds. Leukocytosis and LLQ tenderness improving.

## 2022-09-16 NOTE — ED Notes (Signed)
Pt called out to RN with worsening abd pain and vomiting. Pt given pain and nausea meds. Pt tolerating well. Pt declined miralax at this time. RN secure messaged MD Wieting to clarify metoprolol PO order since pt is on diltiazem drip. Waiting response at this time.

## 2022-09-16 NOTE — Progress Notes (Signed)
Progress Note   Patient: Molly Brown RKY:[id number] DOB: [birth year] DOA: 09/15/2022     1 DOS: the patient was seen and examined on 09/16/2022   Brief hospital course: 72 y.o. 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 Patient presents for evaluation of sudden onset abdominal pain mostly in the epigastrium, periumbilical area.  She rates her pain a 10 x 10 in intensity at its worst.  Pain is nonradiating and is associated with nausea and multiple episodes of emesis.  She states that she has had issues with abdominal pain in the past usually self-limiting but this is the worst pain she has had which prompted her visit to the emergency room.  She is unable to tell me if her prior episodes of pain related to meals.  She has urinary frequency and has dyspnea with exertion which is chronic.  Her last bowel movement was 2 days prior to this admission.  According to the patient she usually has daily bowel movements She denies having any fever, no chills, no chest pain, no headache, no dizziness, no lightheadedness, no cough, no blurred vision no focal deficit. Abnormal labs include lactic acid of 2.0, lipase 539, troponin 24, white count 15.8 CT scan of abdomen and pelvis shows acute interstitial pancreatitis. No evidence of pancreatic necrosis, pancreatic ductal dilation or walled-off collections. Calcifications of the mitral annulus with left atrial dilation, consider further evaluation with cardiac echo if not previously performed. Mild pulmonary edema. Aortic Atherosclerosis. Twelve-lead EKG reviewed by me shows rapid A-fib Patient received 2 L IV fluid bolus, Cardizem 20 mg IV push x 1 and is currently on a Cardizem drip.  1/19.   Trying to taper off Cardizem drip.  Will add empiric antibiotics for pancreatitis since procalcitonin elevated and patient does have a white count.  Assessment and Plan: * Acute pancreatitis Acute pancreatitis.  So far unclear etiology.  The patient does not drink alcohol.  Gallbladder imaging is negative so far.  Triglycerides normal range.  No new medications.  Will send off an IgG 4.  IV fluid hydration.  With elevated procalcitonin and white blood cell count will start empiric Zosyn.  As needed pain and nausea medications.  Atrial fibrillation with RVR (HCC) Chronic in nature.  Try to taper off Cardizem drip.  Okay to give metoprolol.  Will give Cardizem CD this afternoon.  Continue Eliquis for anticoagulation.  Obesity (BMI 30-39.9) BMI 31.51 kg/m2 Complicates overall prognosis and care Life style modification and exercise has been discussed with patient in detail   HOCM (hypertrophic obstructive cardiomyopathy) ([place]) Patient with a history of HOCM Continue metoprolol. Watch respiratory status closely with IV fluids.  May end up needing the Lasix.  Hypothyroidism Continue Synthroid  GERD (gastroesophageal reflux disease) Continue IV PPI        Subjective: Patient had a lot of abdominal pain this morning.  Feeling better after medications.  Patient has been having stomach issues going on for years but never diagnosed with pancreatitis.  She does not drink alcohol.  No new medications.  Triglycerides normal range.  Admitted with acute pancreatitis.  Physical Exam: Vitals:   09/16/22 1102 09/16/22 1130 09/16/22 1200 09/16/22 1230  BP: 109/71 121/76 120/69 137/89  Pulse: (!) 106 94 89 92  Resp: 18 19 14  16  Temp: 98.4 F (36.9 C)     TempSrc:      SpO2: 95% 93% 94% 95%  Weight:      Height:       Physical Exam HENT:     Head: Normocephalic.     Mouth/Throat:     Pharynx: No oropharyngeal exudate.  Eyes:     General: Lids are normal.     Conjunctiva/sclera:  Conjunctivae normal.  Cardiovascular:     Rate and Rhythm: Normal rate and regular rhythm.     Heart sounds: Normal heart sounds, S1 normal and S2 normal.  Pulmonary:     Breath sounds: No decreased breath sounds, wheezing, rhonchi or rales.  Abdominal:     Palpations: Abdomen is soft.     Tenderness: There is generalized abdominal tenderness.  Musculoskeletal:     Right lower leg: Swelling present.     Left lower leg: Swelling present.  Skin:    General: Skin is warm.     Findings: No rash.  Neurological:     Mental Status: She is alert and oriented to person, place, and time.     Data Reviewed: Ultrasound right upper quadrant did not show any acute cholecystitis or gallstones or sludge. CT scan consistent with acute pancreatitis Sodium 133, creatinine 1.0, white blood cell count 19.4, hemoglobin 14, platelet count 220, lipase 539 Family Communication: Spoke with husband at the bedside  Disposition: Status is: Inpatient Remains inpatient appropriate because: Patient still with quite a bit of abdominal pain we will keep n.p.o. this morning.  Potentially can go to liquid diet this evening or tomorrow morning.  Planned Discharge Destination: Home    Time spent: 28 minutes  Author: Loletha Grayer, MD 09/16/2022 2:00 PM  For on call review [email or web address].

## 2022-09-16 NOTE — ED Notes (Signed)
RN notified MD Wieting pt HR has been sustaining 80-90 for a few hours with an occasional increase to 100. Per MD Wieting ok to drop the diltiazem to 2.46ml/hr and if pt HR maintains within normal range dc diltiazem drip.

## 2022-09-17 ENCOUNTER — Encounter: Payer: Self-pay | Admitting: Anesthesiology

## 2022-09-17 ENCOUNTER — Inpatient Hospital Stay: Payer: BC Managed Care – PPO

## 2022-09-17 DIAGNOSIS — K219 Gastro-esophageal reflux disease without esophagitis: Secondary | ICD-10-CM | POA: Diagnosis not present

## 2022-09-17 DIAGNOSIS — I421 Obstructive hypertrophic cardiomyopathy: Secondary | ICD-10-CM | POA: Diagnosis not present

## 2022-09-17 DIAGNOSIS — I4891 Unspecified atrial fibrillation: Secondary | ICD-10-CM | POA: Diagnosis not present

## 2022-09-17 DIAGNOSIS — K859 Acute pancreatitis without necrosis or infection, unspecified: Secondary | ICD-10-CM

## 2022-09-17 LAB — COMPREHENSIVE METABOLIC PANEL
ALT: 13 U/L (ref 0–44)
AST: 20 U/L (ref 15–41)
Albumin: 3.2 g/dL — ABNORMAL LOW (ref 3.5–5.0)
Alkaline Phosphatase: 47 U/L (ref 38–126)
Anion gap: 7 (ref 5–15)
BUN: 22 mg/dL (ref 8–23)
CO2: 25 mmol/L (ref 22–32)
Calcium: 8.5 mg/dL — ABNORMAL LOW (ref 8.9–10.3)
Chloride: 99 mmol/L (ref 98–111)
Creatinine, Ser: 0.77 mg/dL (ref 0.44–1.00)
GFR, Estimated: >60 mL/min (ref >60)
Glucose, Bld: 109 mg/dL — ABNORMAL HIGH (ref 70–99)
Potassium: 4.2 mmol/L (ref 3.5–5.1)
Sodium: 131 mmol/L — ABNORMAL LOW (ref 135–145)
Total Bilirubin: 1.8 mg/dL — ABNORMAL HIGH (ref 0.3–1.2)
Total Protein: 6.5 g/dL (ref 6.5–8.1)

## 2022-09-17 LAB — TYPE AND SCREEN
ABO/RH(D): O NEG
Antibody Screen: NEGATIVE

## 2022-09-17 LAB — CBC
HCT: 39.5 % (ref 36.0–46.0)
Hemoglobin: 13.1 g/dL (ref 12.0–15.0)
MCH: 29.4 pg (ref 26.0–34.0)
MCHC: 33.2 g/dL (ref 30.0–36.0)
MCV: 88.8 fL (ref 80.0–100.0)
Platelets: 190 10*3/uL (ref 150–400)
RBC: 4.45 MIL/uL (ref 3.87–5.11)
RDW: 13.8 % (ref 11.5–15.5)
WBC: 14.4 10*3/uL — ABNORMAL HIGH (ref 4.0–10.5)
nRBC: 0 % (ref 0.0–0.2)

## 2022-09-17 LAB — BRAIN NATRIURETIC PEPTIDE: B Natriuretic Peptide: 186.5 pg/mL — ABNORMAL HIGH (ref 0.0–100.0)

## 2022-09-17 LAB — BILIRUBIN, FRACTIONATED(TOT/DIR/INDIR)
Bilirubin, Direct: 0.5 mg/dL — ABNORMAL HIGH (ref 0.0–0.2)
Indirect Bilirubin: 1.2 mg/dL — ABNORMAL HIGH (ref 0.3–0.9)
Total Bilirubin: 1.7 mg/dL — ABNORMAL HIGH (ref 0.3–1.2)

## 2022-09-17 LAB — LIPASE, BLOOD: Lipase: 50 U/L (ref 11–51)

## 2022-09-17 LAB — PROCALCITONIN: Procalcitonin: 0.78 ng/mL

## 2022-09-17 LAB — APTT: aPTT: 79 seconds — ABNORMAL HIGH (ref 24–36)

## 2022-09-17 MED ORDER — CHLORHEXIDINE GLUCONATE CLOTH 2 % EX PADS
6.0000 | MEDICATED_PAD | Freq: Every day | CUTANEOUS | Status: DC
  Administered 2022-09-18 – 2022-10-05 (×16): 6 via TOPICAL

## 2022-09-17 MED ORDER — IOHEXOL 350 MG/ML SOLN
100.0000 mL | Freq: Once | INTRAVENOUS | Status: AC | PRN
  Administered 2022-09-17: 100 mL via INTRAVENOUS

## 2022-09-17 MED ORDER — HEPARIN BOLUS VIA INFUSION
4000.0000 [IU] | Freq: Once | INTRAVENOUS | Status: AC
  Administered 2022-09-17: 4000 [IU] via INTRAVENOUS
  Filled 2022-09-17: qty 4000

## 2022-09-17 MED ORDER — HEPARIN (PORCINE) 25000 UT/250ML-% IV SOLN
1250.0000 [IU]/h | INTRAVENOUS | Status: AC
  Administered 2022-09-17: 1250 [IU]/h via INTRAVENOUS
  Filled 2022-09-17: qty 250

## 2022-09-17 NOTE — Consult Note (Signed)
ANTICOAGULATION CONSULT NOTE  Pharmacy Consult for IV Heparin Indication: atrial fibrillation  Patient Measurements: Height: 5\' 8"  (172.7 cm) Weight: 99.8 kg (220 lb) IBW/kg (Calculated) : 63.9 Heparin Dosing Weight: 85.8 kg  Labs: Recent Labs    09/15/22 0606 09/15/22 0609 09/15/22 0614 09/16/22 0429 09/17/22 0452  HGB 15.7*  --   --  14.0 13.1  HCT 47.6*  --   --  42.9 39.5  PLT 245  --   --  220 190  APTT  --  26  --   --   --   LABPROT  --  14.6  --   --   --   INR  --  1.2  --   --   --   CREATININE 0.96  --   --  1.00 0.77  TROPONINIHS  --  24* 24*  --   --     Estimated Creatinine Clearance: 82 mL/min (by C-G formula based on SCr of 0.77 mg/dL).   Medical History: Past Medical History:  Diagnosis Date   AF (atrial fibrillation) (HCC)    Anal fissure    Anxiety    Cardiac arrhythmia due to congenital heart disease    Chicken pox    Depression    GERD (gastroesophageal reflux disease)    Heart murmur     Medications:  Apixaban 5 mg BID (last dose 09/16/22 at 2251)  Assessment: 72 y/o F with medical history including Afib on apixaban who is admitted with acute pancreatitis. General surgery consulted. Pharmacy consulted to initiate and manage heparin infusion while apixaban is on hold pending surgery evaluation.   Baseline aPTT and PT-INR are within normal limits. Baseline CBC within normal limits.  Goal of Therapy:  aPTT 66 - 102 seconds Monitor platelets by anticoagulation protocol: Yes   Plan:  --Heparin 4000 unit IV bolus followed by continuous infusion at 1250 units/hr --aPTT 6 hours from initiation of infusion --Daily CBC per protocol while on IV heparin --Continue to monitor plan per surgery  Benita Gutter 09/17/2022,9:54 AM

## 2022-09-17 NOTE — Progress Notes (Addendum)
[facility] Cardiology  SUBJECTIVE: Patient sitting on side of bed, complaining of abdominal discomfort   Vitals:   09/16/22 1730 09/16/22 2156 09/17/22 0000 09/17/22 0335  BP: 115/72 108/74 137/74 134/74  Pulse: 90 (!) 104 100 95  Resp: 19 16 15 18   Temp: 98.3 F (36.8 C) 97.8 F (36.6 C) 98 F (36.7 C) 99.1 F (37.3 C)  TempSrc:   Oral Axillary  SpO2: 91% 96% 97% 98%  Weight:      Height:         Intake/Output Summary (Last 24 hours) at 09/17/2022 0914 Last data filed at 09/17/2022 4098 Gross per 24 hour  Intake 1052.64 ml  Output --  Net 1052.64 ml      PHYSICAL EXAM  General: Well developed, well nourished, in no acute distress HEENT:  Normocephalic and atramatic Neck:  No JVD.  Lungs: Clear bilaterally to auscultation and percussion. Heart: HRRR . Normal S1 and S2 without gallops or murmurs.  Abdomen: Bowel sounds are positive, abdomen soft and non-tender  Msk:  Back normal, normal gait. Normal strength and tone for age. Extremities: No clubbing, cyanosis or edema.   Neuro: Alert and oriented X 3. Psych:  Good affect, responds appropriately   LABS: Basic Metabolic Panel: Recent Labs    09/16/22 0429 09/17/22 0452  NA 133* 131*  K 4.5 4.2  CL 103 99  CO2 25 25  GLUCOSE 140* 109*  BUN 23 22  CREATININE 1.00 0.77  CALCIUM 8.5* 8.5*   Liver Function Tests: Recent Labs    09/15/22 0606 09/17/22 0452  AST 26 20  ALT 18 13  ALKPHOS 55 47  BILITOT 1.3* 1.8*  PROT 7.9 6.5  ALBUMIN 4.1 3.2*   Recent Labs    09/15/22 0606 09/17/22 0452  LIPASE 539* 50   CBC: Recent Labs    09/15/22 0606 09/16/22 0429 09/17/22 0452  WBC 15.8* 19.4* 14.4*  NEUTROABS 13.8*  --   --   HGB 15.7* 14.0 13.1  HCT 47.6* 42.9 39.5  MCV 86.4 88.6 88.8  PLT 245 220 190   Cardiac Enzymes: No results for input(s): "CKTOTAL", "CKMB", "CKMBINDEX", "TROPONINI" in the last 72 hours. BNP: Invalid input(s): "POCBNP" D-Dimer: No results for input(s): "DDIMER" in the last 72  hours. Hemoglobin A1C: No results for input(s): "HGBA1C" in the last 72 hours. Fasting Lipid Panel: Recent Labs    09/15/22 1050  CHOL 157  HDL 43  LDLCALC 104*  TRIG 49  CHOLHDL 3.7   Thyroid Function Tests: No results for input(s): "TSH", "T4TOTAL", "T3FREE", "THYROIDAB" in the last 72 hours.  Invalid input(s): "FREET3" Anemia Panel: No results for input(s): "VITAMINB12", "FOLATE", "FERRITIN", "TIBC", "IRON", "RETICCTPCT" in the last 72 hours.  US ABDOMEN LIMITED RUQ (LIVER/GB)  Result Date: 09/15/2022 CLINICAL DATA:  Pancreatitis. EXAM: ULTRASOUND ABDOMEN LIMITED RIGHT UPPER QUADRANT COMPARISON:  CT abdomen and pelvis 09/15/2022. FINDINGS: Gallbladder: No gallstones or wall thickening visualized. No pericholecystic fluid. The sonographer imaged a tiny focal region of possible ongoing artifact questioning minimal adenomyomatosis in the region of the gallbladder fundus. No sonographic Murphy sign noted by sonographer. Common bile duct: Diameter: 4 mm, within normal limits. No intrahepatic or extrahepatic biliary ductal dilatation. Liver: No focal lesion identified. Within normal limits in parenchymal echogenicity. Minimal perihepatic fluid as seen on today's CT. Portal vein is patent on color Doppler imaging with normal direction of blood flow towards the liver. Other: None. IMPRESSION: 1. Minimal perihepatic fluid as seen on today's CT. 2. No gallstones  or sonographic evidence of acute cholecystitis. Electronically Signed   By: Yvonne Kendall M.D.   On: 09/15/2022 10:22     Echo EF>55%, asymmetric septal hypertrophy, moderate mitral regurgitation  TELEMETRY: Atrial fibrillation 114 bpm:  ASSESSMENT AND PLAN:  Principal Problem:   Acute pancreatitis Active Problems:   GERD (gastroesophageal reflux disease)   Hypothyroidism   Atrial fibrillation with RVR (HCC)   HOCM (hypertrophic obstructive cardiomyopathy) (HCC)   Obesity (BMI 30-39.9)    1.  Chronic atrial fibrillation,  elevated heart rate in the setting of pancreatitis, on Eliquis for stroke prevention, resume Cardizem CD 180 mg daily and metoprolol tartrate 50 mg twice daily for rate control 2.  Acute pancreatitis, supportive care 3.  Hypertrophic obstructive cardiomyopathy, asymmetric septal hypertrophy (SWT 2.0 cm ) observed on 2D echocardiogram 12/30/2021  Recommendations  1.  Agree with current therapy 2.  Continue Eliquis for stroke prevention 3.  Continue Cardizem CD and metoprolol tartrate for rate control 4.  Follow-up with Dr. Clayborn Bigness after discharge   Isaias Cowman, MD, PhD, [facility] 09/17/2022 9:14 AM

## 2022-09-17 NOTE — Progress Notes (Signed)
Progress Note   Patient: Molly Brown GLO:[id number] DOB: 08/27/1951 DOA: 09/15/2022     2 DOS: the patient was seen and examined on 09/17/2022   Brief hospital course: 72 y.o. 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 Patient presents for evaluation of sudden onset abdominal pain mostly in the epigastrium, periumbilical area.  She rates her pain a 10 x 10 in intensity at its worst.  Pain is nonradiating and is associated with nausea and multiple episodes of emesis.  She states that she has had issues with abdominal pain in the past usually self-limiting but this is the worst pain she has had which prompted her visit to the emergency room.  She is unable to tell me if her prior episodes of pain related to meals.  She has urinary frequency and has dyspnea with exertion which is chronic.  Her last bowel movement was 2 days prior to this admission.  According to the patient she usually has daily bowel movements She denies having any fever, no chills, no chest pain, no headache, no dizziness, no lightheadedness, no cough, no blurred vision no focal deficit. Abnormal labs include lactic acid of 2.0, lipase 539, troponin 24, white count 15.8 CT scan of abdomen and pelvis shows acute interstitial pancreatitis. No evidence of pancreatic necrosis, pancreatic ductal dilation or walled-off collections. Calcifications of the mitral annulus with left atrial dilation, consider further evaluation with cardiac echo if not previously performed. Mild pulmonary edema. Aortic Atherosclerosis. Twelve-lead EKG reviewed by me shows rapid A-fib Patient received 2 L IV fluid bolus, Cardizem 20 mg IV push x 1 and is currently on a Cardizem drip.  1/19.   Tapered off Cardizem drip and on oral metoprolol and oral Cardizem CD.  Will add empiric antibiotics for pancreatitis since procalcitonin elevated and patient does have a white count. 1/20.  Consulted general surgery because I do not have a reason for the pancreatitis.  General surgery ordered for a repeat CAT scan.  Holding Eliquis and converting over to heparin drip just in case surgery needed.  Assessment and Plan: * Acute pancreatitis Acute pancreatitis.  Consulted general surgery just in case this is a gallbladder cause.  The patient does not drink alcohol.  Triglycerides normal range.  No new medications.  IgG 4 is pending.  IV fluid hydration.  With elevated procalcitonin and white blood cell count will continue empiric Zosyn.  As needed pain and nausea medications.  Atrial fibrillation with RVR (HCC) Chronic in nature.  Continue metoprolol and Cardizem CD.  Heart rate acceptable with abdominal pain.  As per general surgery will hold Eliquis this morning and convert over to heparin drip just in case surgery needed.  Obesity (BMI 30-39.9) BMI 18.84 kg/m2 Complicates overall prognosis and care Life style modification and exercise has been discussed with patient in detail   HOCM (hypertrophic obstructive cardiomyopathy) ([place]) Patient with a history of HOCM Continue metoprolol. Watch respiratory status closely with IV fluids.  Hypothyroidism Continue Synthroid  GERD (gastroesophageal reflux disease) Continue IV PPI        Subjective: Patient seen this morning she sat herself up and was wanting to get into the chair.  She is still having abdominal pain generalized throughout the abdomen.  Admitted with acute pancreatitis.  Physical Exam: Vitals:  09/17/22 0000 09/17/22 0335 09/17/22 0900 09/17/22 1200  BP: 137/74 134/74 137/80 118/64  Pulse: 100 95 88 87  Resp: 15 18 17 18   Temp: 98 F (36.7 C) 99.1 F (37.3 C) 97.7 F (36.5 C)   TempSrc: Oral Axillary Oral   SpO2: 97%  98% 97% 98%  Weight:      Height:       Physical Exam HENT:     Head: Normocephalic.     Mouth/Throat:     Pharynx: No oropharyngeal exudate.  Eyes:     General: Lids are normal.     Conjunctiva/sclera: Conjunctivae normal.  Cardiovascular:     Rate and Rhythm: Normal rate and regular rhythm.     Heart sounds: Normal heart sounds, S1 normal and S2 normal.  Pulmonary:     Breath sounds: No decreased breath sounds, wheezing, rhonchi or rales.  Abdominal:     Palpations: Abdomen is soft.     Tenderness: There is generalized abdominal tenderness.  Musculoskeletal:     Right lower leg: Swelling present.     Left lower leg: Swelling present.  Skin:    General: Skin is warm.     Findings: No rash.  Neurological:     Mental Status: She is alert and oriented to person, place, and time.     Data Reviewed: Lipase normalized at 50.  Total bilirubin 1.8, sodium 131, creatinine 0.77, white blood cell count 14.4, hemoglobin 13.1, procalcitonin 0.78  Family Communication: Spoke with husband at the bedside  Disposition: Status is: Inpatient Remains inpatient appropriate because: Still being treated for acute pancreatitis.  General surgery consultation for possible cholecystectomy.  Planned Discharge Destination: Home    Time spent: 28 minutes  Author: Loletha Grayer, MD 09/17/2022 2:25 PM  For on call review [email or web address].

## 2022-09-17 NOTE — Plan of Care (Signed)

## 2022-09-17 NOTE — Consult Note (Addendum)
Patient ID: Molly Brown, female   DOB: [birth year], 72 y.o.   MRN: [id number]  HPI Molly Brown is a 72 y.o. female in consultation at the request of Dr. Leslye Peer for pancreatitis.  She does have a history of A-fib and she has been followed by cardiology as well as CHF.  She was in her usual state of health up until 3 days ago or so.  Reported epigastric pain radiated to the back the pain was dull intermittent and moderate intensity.  She also developed nausea and vomiting.  Denies any fevers and chills.  She does have a history of abdominal hysterectomy in the past. Did have a CT scan of the abdomen pelvis and I have repeated this morning and that I have personally reviewed with pancreatic protocol and there is evidence of pancreatitis.  There is also evidence of gallbladder dilation.  No evidence of necrotizing pancreatitis or collections She Did have mild elevation of the bilirubin but this was related to an increase in indirect bilirubin suggesting a cholestatic process rather than an obstructive process Continues to endorse pain. SHe has also been seen by cardiology, case d/w Dr. Saralyn Pilar and they do not seem to think that she needs any further cardiac workup.   HPI  Past Medical History:  Diagnosis Date   AF (atrial fibrillation) (HCC)    Anal fissure    Anxiety    Cardiac arrhythmia due to congenital heart disease    Chicken pox    Depression    GERD (gastroesophageal reflux disease)    Heart murmur     Past Surgical History:  Procedure Laterality Date   ABDOMINAL HYSTERECTOMY     AUGMENTATION MAMMAPLASTY Bilateral 1999   BREAST ENHANCEMENT SURGERY  2000   TONSILLECTOMY  1972   TOOTH EXTRACTION      Family History  Problem Relation Age of Onset   Arthritis Mother    Heart disease Father    Arthritis Maternal Grandmother    Arthritis Paternal Grandmother    Heart disease Paternal Grandmother    Breast cancer Neg Hx     Social History Social History   Tobacco Use    Smoking status: Former    Types: Cigarettes   Smokeless tobacco: Never  Substance Use Topics   Alcohol use: No   Drug use: No    Allergies  Allergen Reactions   Eszopiclone Other (See Comments)    Felt really weird   Flecainide Other (See Comments) and Rash    Insomnia and felt drugged    Current Facility-Administered Medications  Medication Dose Route Frequency Provider Last Rate Last Admin   0.9 %  sodium chloride infusion   Intravenous Continuous Loletha Grayer, MD 50 mL/hr at 09/17/22 1009 Rate Change at 09/17/22 1009   acetaminophen (TYLENOL) tablet 650 mg  650 mg Oral Q6H PRN Agbata, Tochukwu, MD       cyanocobalamin (VITAMIN B12) tablet 500 mcg  500 mcg Oral Daily Agbata, Tochukwu, MD   500 mcg at 09/17/22 1054   diltiazem (CARDIZEM CD) 24 hr capsule 180 mg  180 mg Oral Daily Wieting, Richard, MD   180 mg at 09/17/22 1151   heparin ADULT infusion 100 units/mL (25000 units/279mL)  1,250 Units/hr Intravenous Continuous Benita Gutter, RPH 12.5 mL/hr at 09/17/22 1143 1,250 Units/hr at 09/17/22 1143   HYDROmorphone (DILAUDID) injection 1 mg  1 mg Intravenous Q3H PRN Agbata, Tochukwu, MD   1 mg at 09/17/22 1549   levothyroxine (SYNTHROID) tablet 25 mcg  25 mcg Oral Q0600 Agbata, Tochukwu, MD   25 mcg at 09/17/22 0501   metoprolol tartrate (LOPRESSOR) tablet 50 mg  50 mg Oral BID Agbata, Tochukwu, MD   50 mg at 09/17/22 1055   multivitamin with minerals tablet 1 tablet  1 tablet Oral Daily Agbata, Tochukwu, MD   1 tablet at 09/17/22 1055   ondansetron (ZOFRAN) tablet 4 mg  4 mg Oral Q6H PRN Agbata, Tochukwu, MD       Or   ondansetron (ZOFRAN) injection 4 mg  4 mg Intravenous Q6H PRN Agbata, Tochukwu, MD   4 mg at 09/16/22 1943   oxyCODONE (Oxy IR/ROXICODONE) immediate release tablet 5 mg  5 mg Oral Q4H PRN Alford Highland, MD   5 mg at 09/16/22 1742   pantoprazole (PROTONIX) injection 40 mg  40 mg Intravenous Q24H Agbata, Tochukwu, MD   40 mg at 09/17/22 1055    piperacillin-tazobactam (ZOSYN) IVPB 3.375 g  3.375 g Intravenous Q8H Nazari, Walid A, RPH 12.5 mL/hr at 09/17/22 1546 3.375 g at 09/17/22 1546   polyethylene glycol (MIRALAX / GLYCOLAX) packet 17 g  17 g Oral Daily Agbata, Tochukwu, MD   17 g at 09/17/22 1055     Review of Systems Full ROS  was asked and was negative except for the information on the HPI  Physical Exam Blood pressure 118/64, pulse 87, temperature 97.7 F (36.5 C), temperature source Oral, resp. rate 18, height 5\' 8"  (1.727 m), weight 99.8 kg, SpO2 98 %. CONSTITUTIONAL: Chronically ill. EYES: Pupils are equal, round, and reactive to light, Sclera are non-icteric. EARS, NOSE, MOUTH AND THROAT: The oropharynx is clear. The oral mucosa is pink and moist. Hearing is intact to voice. LYMPH NODES:  Lymph nodes in the neck are normal. RESPIRATORY:  Lungs are clear. There is normal respiratory effort, with equal breath sounds bilaterally, and without pathologic use of accessory muscles. CARDIOVASCULAR: Heart is regular without murmurs, gallops, or rubs. GI: The abdomen is  soft, tender epigastric area w/o peritonitis,  There are no palpable masses. There is no hepatosplenomegaly. There are normal bowel sounds in all quadrants. GU: Rectal deferred.   MUSCULOSKELETAL: Normal muscle strength and tone. No cyanosis or edema.   SKIN: Turgor is good and there are no pathologic skin lesions or ulcers. NEUROLOGIC: Motor and sensation is grossly normal. Cranial nerves are grossly intact. PSYCH:  Oriented to person, place and time. Affect is normal.  Data Reviewed  I have personally reviewed the patient's imaging, laboratory findings and medical records.    Assessment/Plan    Pancreatitis without any clear-cut etiologies.  On the CT scan and the imaging findings there is evidence of gallbladder distention.  I cannot tell FOR SURE IF THIS IS RELATED TO GALLSTONE PANCREATITIS.  I HAD AN EXTENSIVE DISCUSSION WITH THE FAMILY REGARDING  OPTIONS.  WE CAN ALWAYS WAIT. No other alternative is to proceed with cholecystectomy. Will continue to follow her and if her pancreatitis worsens we will hold off any surgical intervention if she improves we may consider cholecystectomy. I have stopped the eliquis and transition her to heparin drip The risks, benefits, complications, treatment options, and expected outcomes were discussed with the patient. The possibilities of bleeding, recurrent infection, finding a normal gallbladder, perforation of viscus organs, damage to surrounding structures, bile leak, abscess formation, needing a drain placed, the need for additional procedures, reaction to medication, pulmonary aspiration,  failure to diagnose a condition, the possible need to convert to an open procedure, and creating  a complication requiring transfusion or operation were discussed with the patient. The patient and/or family concurred with the proposed plan, giving informed consent.  Please note that I have spent 75 minutes in this encounter including personally reviewing imaging studies, coordinating his care, placing orders, counseling the patient and performing appropriate documentation . I will continue to follow her      Caroleen Hamman, MD FACS General Surgeon 09/17/2022, 4:10 PM

## 2022-09-17 NOTE — Consult Note (Signed)
ANTICOAGULATION CONSULT NOTE  Pharmacy Consult for IV Heparin Indication: atrial fibrillation  Patient Measurements: Height: 5\' 8"  (172.7 cm) Weight: 99.8 kg (220 lb) IBW/kg (Calculated) : 63.9 Heparin Dosing Weight: 85.8 kg  Labs: Recent Labs    09/15/22 0606 09/15/22 0609 09/15/22 0614 09/16/22 0429 09/17/22 0452 09/17/22 1734  HGB 15.7*  --   --  14.0 13.1  --   HCT 47.6*  --   --  42.9 39.5  --   PLT 245  --   --  220 190  --   APTT  --  26  --   --   --  79*  LABPROT  --  14.6  --   --   --   --   INR  --  1.2  --   --   --   --   CREATININE 0.96  --   --  1.00 0.77  --   TROPONINIHS  --  24* 24*  --   --   --      Estimated Creatinine Clearance: 82 mL/min (by C-G formula based on SCr of 0.77 mg/dL).   Medical History: Past Medical History:  Diagnosis Date   AF (atrial fibrillation) (HCC)    Anal fissure    Anxiety    Cardiac arrhythmia due to congenital heart disease    Chicken pox    Depression    GERD (gastroesophageal reflux disease)    Heart murmur     Medications:  Apixaban 5 mg BID (last dose 09/16/22 at 2251)  Assessment: 72 y/o F with medical history including Afib on apixaban who is admitted with acute pancreatitis. General surgery consulted. Pharmacy consulted to initiate and manage heparin infusion while apixaban is on hold pending surgery evaluation.   Baseline aPTT and PT-INR are within normal limits. Baseline CBC within normal limits.  Goal of Therapy:  aPTT 66 - 102 seconds Monitor platelets by anticoagulation protocol: Yes   Plan: aPTT therapeutic --Continue heparin 1250 units/hr. --Planning for surgery 1/21-- hold heparin 1/21 @0300   --aPTT 6 hours from initiation of infusion --Daily CBC per protocol while on IV heparin --Continue to monitor plan per surgery  Wynelle Cleveland 09/17/2022,6:44 PM

## 2022-09-18 ENCOUNTER — Encounter
Admission: EM | Disposition: A | Discharge status: Home / Self Care | Payer: Self-pay | Source: Home / Self Care | Attending: Internal Medicine

## 2022-09-18 DIAGNOSIS — G9341 Metabolic encephalopathy: Secondary | ICD-10-CM

## 2022-09-18 DIAGNOSIS — K859 Acute pancreatitis without necrosis or infection, unspecified: Secondary | ICD-10-CM | POA: Diagnosis not present

## 2022-09-18 DIAGNOSIS — E669 Obesity, unspecified: Secondary | ICD-10-CM | POA: Diagnosis not present

## 2022-09-18 DIAGNOSIS — K858 Other acute pancreatitis without necrosis or infection: Secondary | ICD-10-CM | POA: Diagnosis not present

## 2022-09-18 LAB — COMPREHENSIVE METABOLIC PANEL
ALT: 14 U/L (ref 0–44)
AST: 19 U/L (ref 15–41)
Albumin: 2.7 g/dL — ABNORMAL LOW (ref 3.5–5.0)
Alkaline Phosphatase: 46 U/L (ref 38–126)
Anion gap: 8 (ref 5–15)
BUN: 17 mg/dL (ref 8–23)
CO2: 23 mmol/L (ref 22–32)
Calcium: 8.3 mg/dL — ABNORMAL LOW (ref 8.9–10.3)
Chloride: 100 mmol/L (ref 98–111)
Creatinine, Ser: 0.74 mg/dL (ref 0.44–1.00)
GFR, Estimated: >60 mL/min (ref >60)
Glucose, Bld: 98 mg/dL (ref 70–99)
Potassium: 3.8 mmol/L (ref 3.5–5.1)
Sodium: 131 mmol/L — ABNORMAL LOW (ref 135–145)
Total Bilirubin: 2.8 mg/dL — ABNORMAL HIGH (ref 0.3–1.2)
Total Protein: 6.1 g/dL — ABNORMAL LOW (ref 6.5–8.1)

## 2022-09-18 LAB — CBC
HCT: 33.8 % — ABNORMAL LOW (ref 36.0–46.0)
Hemoglobin: 11.1 g/dL — ABNORMAL LOW (ref 12.0–15.0)
MCH: 28.7 pg (ref 26.0–34.0)
MCHC: 32.8 g/dL (ref 30.0–36.0)
MCV: 87.3 fL (ref 80.0–100.0)
Platelets: 209 10*3/uL (ref 150–400)
RBC: 3.87 MIL/uL (ref 3.87–5.11)
RDW: 13.8 % (ref 11.5–15.5)
WBC: 12 10*3/uL — ABNORMAL HIGH (ref 4.0–10.5)
nRBC: 0 % (ref 0.0–0.2)

## 2022-09-18 LAB — LIPASE, BLOOD: Lipase: 29 U/L (ref 11–51)

## 2022-09-18 SURGERY — CHOLECYSTECTOMY, ROBOT-ASSISTED, LAPAROSCOPIC
Anesthesia: Choice

## 2022-09-18 MED ORDER — HEPARIN (PORCINE) 25000 UT/250ML-% IV SOLN
1800.0000 [IU]/h | INTRAVENOUS | Status: DC
  Administered 2022-09-19: 1250 [IU]/h via INTRAVENOUS
  Administered 2022-09-20 – 2022-09-21 (×4): 1800 [IU]/h via INTRAVENOUS
  Filled 2022-09-18 (×6): qty 250

## 2022-09-18 MED ORDER — ENOXAPARIN SODIUM 100 MG/ML IJ SOSY
1.0000 mg/kg | PREFILLED_SYRINGE | Freq: Two times a day (BID) | INTRAMUSCULAR | Status: AC
  Administered 2022-09-18: 100 mg via SUBCUTANEOUS
  Filled 2022-09-18: qty 1

## 2022-09-18 SURGICAL SUPPLY — 48 items
CANNULA REDUC XI 12-8 STAPL (CANNULA) ×1
CANNULA REDUCER 12-8 DVNC XI (CANNULA) ×1 IMPLANT
CATH REDDICK CHOLANGI 4FR 50CM (CATHETERS) IMPLANT
CLIP LIGATING HEMO O LOK GREEN (MISCELLANEOUS) ×1 IMPLANT
DERMABOND ADVANCED .7 DNX12 (GAUZE/BANDAGES/DRESSINGS) ×1 IMPLANT
DRAPE ARM DVNC X/XI (DISPOSABLE) ×4 IMPLANT
DRAPE COLUMN DVNC XI (DISPOSABLE) ×1 IMPLANT
DRAPE DA VINCI XI ARM (DISPOSABLE) ×4
DRAPE DA VINCI XI COLUMN (DISPOSABLE) ×1
ELECT CAUTERY BLADE 6.4 (BLADE) ×1 IMPLANT
ELECT REM PT RETURN 9FT ADLT (ELECTROSURGICAL) ×1
ELECTRODE REM PT RTRN 9FT ADLT (ELECTROSURGICAL) ×1 IMPLANT
GLOVE BIO SURGEON STRL SZ7 (GLOVE) ×2 IMPLANT
GOWN STRL REUS W/ TWL LRG LVL3 (GOWN DISPOSABLE) ×4 IMPLANT
GOWN STRL REUS W/TWL LRG LVL3 (GOWN DISPOSABLE) ×4
IRRIGATION STRYKERFLOW (MISCELLANEOUS) IMPLANT
IRRIGATOR STRYKERFLOW (MISCELLANEOUS)
IV CATH ANGIO 12GX3 LT BLUE (NEEDLE) IMPLANT
KIT PINK PAD W/HEAD ARE REST (MISCELLANEOUS) ×1
KIT PINK PAD W/HEAD ARM REST (MISCELLANEOUS) ×1 IMPLANT
LABEL OR SOLS (LABEL) ×1 IMPLANT
MANIFOLD NEPTUNE II (INSTRUMENTS) ×1 IMPLANT
NEEDLE HYPO 22GX1.5 SAFETY (NEEDLE) ×1 IMPLANT
NS IRRIG 500ML POUR BTL (IV SOLUTION) ×1 IMPLANT
OBTURATOR OPTICAL STANDARD 8MM (TROCAR) ×1
OBTURATOR OPTICAL STND 8 DVNC (TROCAR) ×1
OBTURATOR OPTICALSTD 8 DVNC (TROCAR) ×1 IMPLANT
PACK LAP CHOLECYSTECTOMY (MISCELLANEOUS) ×1 IMPLANT
PENCIL SMOKE EVACUATOR (MISCELLANEOUS) ×1 IMPLANT
SEAL CANN UNIV 5-8 DVNC XI (MISCELLANEOUS) ×3 IMPLANT
SEAL XI 5MM-8MM UNIVERSAL (MISCELLANEOUS) ×3
SET TUBE SMOKE EVAC HIGH FLOW (TUBING) ×1 IMPLANT
SOLUTION ELECTROLUBE (MISCELLANEOUS) ×1 IMPLANT
SPIKE FLUID TRANSFER (MISCELLANEOUS) ×1 IMPLANT
SPONGE T-LAP 18X18 ~~LOC~~+RFID (SPONGE) ×1 IMPLANT
SPONGE T-LAP 4X18 ~~LOC~~+RFID (SPONGE) IMPLANT
STAPLER CANNULA SEAL DVNC XI (STAPLE) ×1 IMPLANT
STAPLER CANNULA SEAL XI (STAPLE) ×1
STOPCOCK 3 WAY MALE LL (IV SETS)
STOPCOCK 3WAY MALE LL (IV SETS) IMPLANT
SUT MNCRL AB 4-0 PS2 18 (SUTURE) ×1 IMPLANT
SUT VICRYL 0 UR6 27IN ABS (SUTURE) ×2 IMPLANT
SYR 30ML LL (SYRINGE) ×1 IMPLANT
SYS BAG RETRIEVAL 10MM (BASKET) ×1
SYSTEM BAG RETRIEVAL 10MM (BASKET) ×1 IMPLANT
TRAP FLUID SMOKE EVACUATOR (MISCELLANEOUS) ×1 IMPLANT
WATER STERILE IRR 3000ML UROMA (IV SOLUTION) IMPLANT
WATER STERILE IRR 500ML POUR (IV SOLUTION) ×1 IMPLANT

## 2022-09-18 NOTE — Progress Notes (Signed)
Patient pulled IV lines purposely and told nurse she is ready to go home and doesn't want surgery.

## 2022-09-18 NOTE — Assessment & Plan Note (Addendum)
Resolved.  Had some delirium when more ill and getting IV pain meds. --Delirium precautions --Minimize sedating meds

## 2022-09-18 NOTE — Progress Notes (Signed)
[facility] Cardiology  SUBJECTIVE: Denies chest pain or palpitation   Vitals:   09/17/22 2318 09/18/22 0403 09/18/22 0508 09/18/22 0740  BP: 138/69 128/76 123/73 131/71  Pulse: (!) 102 95  91  Resp: 20 20 16 16   Temp: 98 F (36.7 C) 98.9 F (37.2 C) 98.7 F (37.1 C) 97.8 F (36.6 C)  TempSrc:  Oral Oral Oral  SpO2: 98% 93% 97% 99%  Weight:      Height:         Intake/Output Summary (Last 24 hours) at 09/18/2022 0848 Last data filed at 09/18/2022 0932 Gross per 24 hour  Intake 1836.89 ml  Output 200 ml  Net 1636.89 ml      PHYSICAL EXAM  General: Well developed, well nourished, in no acute distress HEENT:  Normocephalic and atramatic Neck:  No JVD.  Lungs: Clear bilaterally to auscultation and percussion. Heart: HRRR . Normal S1 and S2 without gallops or murmurs.  Abdomen: Bowel sounds are positive, abdomen soft and non-tender  Msk:  Back normal, normal gait. Normal strength and tone for age. Extremities: No clubbing, cyanosis or edema.   Neuro: Alert and oriented X 3. Psych:  Good affect, responds appropriately   LABS: Basic Metabolic Panel: Recent Labs    09/17/22 0452 09/18/22 0525  NA 131* 131*  K 4.2 3.8  CL 99 100  CO2 25 23  GLUCOSE 109* 98  BUN 22 17  CREATININE 0.77 0.74  CALCIUM 8.5* 8.3*   Liver Function Tests: Recent Labs    09/17/22 0452 09/18/22 0525  AST 20 19  ALT 13 14  ALKPHOS 47 46  BILITOT 1.8* 2.8*  PROT 6.5 6.1*  ALBUMIN 3.2* 2.7*   Recent Labs    09/17/22 0452 09/18/22 0525  LIPASE 50 29   CBC: Recent Labs    09/17/22 0452 09/18/22 0525  WBC 14.4* 12.0*  HGB 13.1 11.1*  HCT 39.5 33.8*  MCV 88.8 87.3  PLT 190 209   Cardiac Enzymes: No results for input(s): "CKTOTAL", "CKMB", "CKMBINDEX", "TROPONINI" in the last 72 hours. BNP: Invalid input(s): "POCBNP" D-Dimer: No results for input(s): "DDIMER" in the last 72 hours. Hemoglobin A1C: No results for input(s): "HGBA1C" in the last 72 hours. Fasting Lipid  Panel: Recent Labs    09/15/22 1050  CHOL 157  HDL 43  LDLCALC 104*  TRIG 49  CHOLHDL 3.7   Thyroid Function Tests: No results for input(s): "TSH", "T4TOTAL", "T3FREE", "THYROIDAB" in the last 72 hours.  Invalid input(s): "FREET3" Anemia Panel: No results for input(s): "VITAMINB12", "FOLATE", "FERRITIN", "TIBC", "IRON", "RETICCTPCT" in the last 72 hours.  CT ABD PELVIS W/WO CM ONCOLOGY PANCREATIC PROTOCOL  Result Date: 09/17/2022 CLINICAL DATA:  Acute pancreatitis EXAM: CT ABDOMEN AND PELVIS WITHOUT AND WITH CONTRAST TECHNIQUE: Multidetector CT imaging of the abdomen and pelvis was performed following the standard protocol before and following the bolus administration of intravenous contrast. RADIATION DOSE REDUCTION: This exam was performed according to the departmental dose-optimization program which includes automated exposure control, adjustment of the mA and/or kV according to patient size and/or use of iterative reconstruction technique. CONTRAST:  165mL OMNIPAQUE IOHEXOL 350 MG/ML SOLN COMPARISON:  09/15/2022 FINDINGS: Lower chest: New small bilateral pleural effusions and associated atelectasis or consolidation. Cardiomegaly with severe left atrial dilatation. Coronary artery calcifications. Partially imaged breast implants. Hepatobiliary: No solid liver abnormality is seen. Excreted contrast in the gallbladder. No gallstones, gallbladder wall thickening, or biliary dilatation. Pancreas: Diffuse fat stranding and inflammatory fluid about the pancreas and adjacent  retroperitoneum, very similar to prior examination. No pancreatic parenchymal hypoenhancement or discrete pancreatic fluid collection. Spleen: Normal in size without significant abnormality. Adrenals/Urinary Tract: Adrenal glands are unremarkable. Kidneys are normal, without renal calculi, solid lesion, or hydronephrosis. Excreted contrast in the bladder. Bladder is otherwise unremarkable. Stomach/Bowel: Stomach is within normal  limits. Appendix appears normal. No evidence of bowel wall thickening, distention, or inflammatory changes. Descending and sigmoid diverticulosis. Vascular/Lymphatic: Aortic atherosclerosis. Unchanged prominent portacaval, retroperitoneal, and mesenteric lymph nodes. Reproductive: No mass or other significant abnormality. Other: No abdominal wall hernia or abnormality. Increased small volume ascites throughout the abdomen and pelvis. Musculoskeletal: No acute or significant osseous findings. IMPRESSION: 1. Diffuse fat stranding and inflammatory fluid about the pancreas and adjacent retroperitoneum, very similar to prior examination and consistent with acute pancreatitis. No pancreatic parenchymal hypoenhancement to suggest necrosis nor discrete pancreatic fluid collection. 2. Increased small volume ascites throughout the abdomen and pelvis. 3. New small bilateral pleural effusions and associated atelectasis or consolidation. 4. Cardiomegaly with severe left atrial dilatation. Coronary artery disease. Aortic Atherosclerosis (ICD10-I70.0). Electronically Signed   By: Delanna Ahmadi M.D.   On: 09/17/2022 14:41     Echo EF>55%, asymmetric septal hypertrophy consistent with hypertrophic obstructive cardiomyopathy, moderate mitral regurgitation  TELEMETRY: Atrial fibrillation 104 bpm:  ASSESSMENT AND PLAN:  Principal Problem:   Acute pancreatitis Active Problems:   GERD (gastroesophageal reflux disease)   Hypothyroidism   Atrial fibrillation with RVR (HCC)   HOCM (hypertrophic obstructive cardiomyopathy) (HCC)   Obesity (BMI 30-39.9)    1. Chronic atrial fibrillation, mildly elevated heart rate in the setting of pancreatitis, on Eliquis for stroke prevention, switched to heparin infusion preparation for possible cholecystectomy, on Cardizem CD 180 mg daily and metoprolol tartrate 50 mg twice daily for rate control. 2.  Acute pancreatitis, supportive care, possible cholecystectomy.  However, patient  reluctant to proceed with surgery, 3.  Hypertrophic obstructive cardiomyopathy, asymmetric septal hypertrophy (SWT 2.0 cm ) observed on 2D echocardiogram 12/30/2021   Recommendations   1.  Agree with current therapy 2.  If do not proceed with cholecystectomy, resume Eliquis for stroke prevention 3.  Continue Cardizem CD and metoprolol tartrate for rate control 4.  Follow-up with Dr. Clayborn Bigness after discharge   Isaias Cowman, MD, PhD, [facility] 09/18/2022 8:48 AM

## 2022-09-18 NOTE — Consult Note (Addendum)
[place] for IV Heparin Indication: atrial fibrillation  Patient Measurements: Height: 5\' 8"  (172.7 cm) Weight: 99.8 kg (220 lb) IBW/kg (Calculated) : 63.9 Heparin Dosing Weight: 85.8 kg  Labs: Recent Labs    09/16/22 0429 09/17/22 0452 09/17/22 1734 09/18/22 0525  HGB 14.0 13.1  --  11.1*  HCT 42.9 39.5  --  33.8*  PLT 220 190  --  209  APTT  --   --  79*  --   CREATININE 1.00 0.77  --  0.74     Estimated Creatinine Clearance: 82 mL/min (by C-G formula based on SCr of 0.74 mg/dL).   Medical History: Past Medical History:  Diagnosis Date   AF (atrial fibrillation) (HCC)    Anal fissure    Anxiety    Cardiac arrhythmia due to congenital heart disease    Chicken pox    Depression    GERD (gastroesophageal reflux disease)    Heart murmur     Medications:  Apixaban 5 mg BID (last dose 09/16/22 at 2251)  Assessment: 72 y/o F with medical history including Afib on apixaban who is admitted with acute pancreatitis. General surgery consulted. Pharmacy consulted to initiate and manage heparin infusion while apixaban is on hold pending surgery evaluation. Heparin was stopped early morning 1/21 @ 0300 for surgery but patient then became not amenable to procedure, pulled out her IV, and wanted to go home. She was reoriented and agreed to surgery, which was subsequently postponed. In order to allow time for new IV placement, per discussion with MD, we opted to give a one-time dose of enoxaparin 1 mg/kg to provide anticoagulation until IV access could be established again for heparin. IV access established again 1/21 @ ~1530.  Goal of Therapy:  Heparin level 0.3-0.7 units/ml aPTT 66 - 102 seconds Monitor platelets by anticoagulation protocol: Yes  Date Time aPTT/HL Rate/Comment     Plan: Last dose of apixaban 1/19 @ 2251 Give enoxaparin 1 mg/kg q12H x 1 dose Per [facility] anticoagulation protocol, initiate UFH (no bolus) four hours before  next dose of therapeutic enoxaparin UFH 1250 units/hr starting 1/22 @ 0100 PTT and HL 6 hours from initiation of infusion Daily CBC per protocol while on IV heparin Continue to monitor plan per surgery  Will M. Ouida Sills, PharmD PGY-1 Pharmacy Resident 09/18/2022 7:20 PM

## 2022-09-18 NOTE — Progress Notes (Signed)
CC: pancreatitis Subjective: Patient continues to complain of intermittent abdominal pain.  She also had some delirium and encephalopathy that is likely related to medications.  Her lipase continues to be normal now.  Decreased appetite.  Objective: Vital signs in last 24 hours: Temp:  [97.5 F (36.4 C)-98.9 F (37.2 C)] 98.8 F (37.1 C) (01/21 1255) Pulse Rate:  [89-102] 93 (01/21 1635) Resp:  [16-20] 16 (01/21 1255) BP: (123-145)/(69-76) 127/75 (01/21 1635) SpO2:  [93 %-99 %] 98 % (01/21 1635) Last BM Date : 09/13/22  Intake/Output from previous day: 01/20 0701 - 01/21 0700 In: 1836.9 [I.V.:1717.5; IV Piggyback:119.4] Out: 200 [Urine:200] Intake/Output this shift: No intake/output data recorded.  Physical exam:  ONSTITUTIONAL: Chronically ill. EYES: Pupils are equal, round, and reactive to light, Sclera are non-icteric. EARS, NOSE, MOUTH AND THROAT: The oropharynx is clear. The oral mucosa is pink and moist. Hearing is intact to voice. LYMPH NODES:  Lymph nodes in the neck are normal. RESPIRATORY:  Lungs are clear. There is normal respiratory effort, with equal breath sounds bilaterally, and without pathologic use of accessory muscles. CARDIOVASCULAR: Heart is regular without murmurs, gallops, or rubs. GI: The abdomen is  soft, mildly tender epigastric area w/o peritonitis,  There are no palpable masses. There is no hepatosplenomegaly. There are normal bowel sounds in all quadrants. GU: Rectal deferred.   MUSCULOSKELETAL: Normal muscle strength and tone. No cyanosis or edema.   SKIN: Turgor is good and there are no pathologic skin lesions or ulcers. NEUROLOGIC: Motor and sensation is grossly normal. Cranial nerves are grossly intact. PSYCH:  Oriented to person, place and time. Affect is normal.   Lab Results: CBC  Recent Labs    09/17/22 0452 09/18/22 0525  WBC 14.4* 12.0*  HGB 13.1 11.1*  HCT 39.5 33.8*  PLT 190 209   BMET Recent Labs    09/17/22 0452  09/18/22 0525  NA 131* 131*  K 4.2 3.8  CL 99 100  CO2 25 23  GLUCOSE 109* 98  BUN 22 17  CREATININE 0.77 0.74  CALCIUM 8.5* 8.3*   PT/INR No results for input(s): "LABPROT", "INR" in the last 72 hours. ABG No results for input(s): "PHART", "HCO3" in the last 72 hours.  Invalid input(s): "PCO2", "PO2"  Studies/Results: CT ABD PELVIS W/WO CM ONCOLOGY PANCREATIC PROTOCOL  Result Date: 09/17/2022 CLINICAL DATA:  Acute pancreatitis EXAM: CT ABDOMEN AND PELVIS WITHOUT AND WITH CONTRAST TECHNIQUE: Multidetector CT imaging of the abdomen and pelvis was performed following the standard protocol before and following the bolus administration of intravenous contrast. RADIATION DOSE REDUCTION: This exam was performed according to the departmental dose-optimization program which includes automated exposure control, adjustment of the mA and/or kV according to patient size and/or use of iterative reconstruction technique. CONTRAST:  141mL OMNIPAQUE IOHEXOL 350 MG/ML SOLN COMPARISON:  09/15/2022 FINDINGS: Lower chest: New small bilateral pleural effusions and associated atelectasis or consolidation. Cardiomegaly with severe left atrial dilatation. Coronary artery calcifications. Partially imaged breast implants. Hepatobiliary: No solid liver abnormality is seen. Excreted contrast in the gallbladder. No gallstones, gallbladder wall thickening, or biliary dilatation. Pancreas: Diffuse fat stranding and inflammatory fluid about the pancreas and adjacent retroperitoneum, very similar to prior examination. No pancreatic parenchymal hypoenhancement or discrete pancreatic fluid collection. Spleen: Normal in size without significant abnormality. Adrenals/Urinary Tract: Adrenal glands are unremarkable. Kidneys are normal, without renal calculi, solid lesion, or hydronephrosis. Excreted contrast in the bladder. Bladder is otherwise unremarkable. Stomach/Bowel: Stomach is within normal limits. Appendix appears normal. No  evidence of bowel wall thickening, distention, or inflammatory changes. Descending and sigmoid diverticulosis. Vascular/Lymphatic: Aortic atherosclerosis. Unchanged prominent portacaval, retroperitoneal, and mesenteric lymph nodes. Reproductive: No mass or other significant abnormality. Other: No abdominal wall hernia or abnormality. Increased small volume ascites throughout the abdomen and pelvis. Musculoskeletal: No acute or significant osseous findings. IMPRESSION: 1. Diffuse fat stranding and inflammatory fluid about the pancreas and adjacent retroperitoneum, very similar to prior examination and consistent with acute pancreatitis. No pancreatic parenchymal hypoenhancement to suggest necrosis nor discrete pancreatic fluid collection. 2. Increased small volume ascites throughout the abdomen and pelvis. 3. New small bilateral pleural effusions and associated atelectasis or consolidation. 4. Cardiomegaly with severe left atrial dilatation. Coronary artery disease. Aortic Atherosclerosis (ICD10-I70.0). Electronically Signed   By: Delanna Ahmadi M.D.   On: 09/17/2022 14:41    Anti-infectives: Anti-infectives (From admission, onward)    Start     Dose/Rate Route Frequency Ordered Stop   09/16/22 1415  piperacillin-tazobactam (ZOSYN) IVPB 3.375 g        3.375 g 12.5 mL/hr over 240 Minutes Intravenous Every 8 hours 09/16/22 1410         Assessment/Plan: Pancreatitis of unknown etiology suspect gallstone pancreatitis. She did have encephalopathy and persistent pain.  Given findings I am concerned that she is not out of the woods regarding pancreatitis and the injury might not be in the best interest of the patient to proceed with cholecystectomy.  The husband was very disappointed but understood my thought process.  I do think that her medical status needs to be improved before semielective cholecystectomy is performed.  I had an extensive discussion about the rationale of my recommendations and they showed  understanding.  She is not toxic from an abdominal perspective and does not need emergent surgical intervention. I was very honest with them that even if he were gallstone pancreatitis it was not going to affect the acute pancreatic process. Please note that I have spent 50 minutes in this encounter including personally reviewing imaging studies, coordinating his care, placing orders, counseling the patient and performing appropriate documentation . I will continue to follow her  Caroleen Hamman, MD, [facility]  09/18/2022

## 2022-09-18 NOTE — Plan of Care (Signed)

## 2022-09-18 NOTE — Progress Notes (Signed)
Mobility Specialist - Progress Note    09/18/22 1000  Mobility  Activity Stood at bedside;Transferred to/from [facility];Transferred from bed to chair  Level of Assistance Standby assist, set-up cues, supervision of patient - no hands on  Assistive Device BSC  Distance Ambulated (ft) 3 ft  Activity Response Tolerated well  Mobility Referral Yes  $Mobility charge 1 Mobility   Pt resting in bed on 2L upon entry. Pt STS and ambulates to BSC SBA with no AD. Pt returned to recliner and left with needs in reach and bed alarm activated.   Loma Sender Mobility Specialist 09/18/22, 10:28 AM

## 2022-09-18 NOTE — Progress Notes (Signed)
Progress Note   Patient: Molly Brown EUM:[id number] DOB: 1951-06-23 DOA: 09/15/2022     3 DOS: the patient was seen and examined on 09/18/2022   Brief hospital course: 72 y.o. 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 Patient presents for evaluation of sudden onset abdominal pain mostly in the epigastrium, periumbilical area.  She rates her pain a 10 x 10 in intensity at its worst.  Pain is nonradiating and is associated with nausea and multiple episodes of emesis.  She states that she has had issues with abdominal pain in the past usually self-limiting but this is the worst pain she has had which prompted her visit to the emergency room.  She is unable to tell me if her prior episodes of pain related to meals.  She has urinary frequency and has dyspnea with exertion which is chronic.  Her last bowel movement was 2 days prior to this admission.  According to the patient she usually has daily bowel movements She denies having any fever, no chills, no chest pain, no headache, no dizziness, no lightheadedness, no cough, no blurred vision no focal deficit. Abnormal labs include lactic acid of 2.0, lipase 539, troponin 24, white count 15.8 CT scan of abdomen and pelvis shows acute interstitial pancreatitis. No evidence of pancreatic necrosis, pancreatic ductal dilation or walled-off collections. Calcifications of the mitral annulus with left atrial dilation, consider further evaluation with cardiac echo if not previously performed. Mild pulmonary edema. Aortic Atherosclerosis. Twelve-lead EKG reviewed by me shows rapid A-fib Patient received 2 L IV fluid bolus, Cardizem 20 mg IV push x 1 and is currently on a Cardizem drip.  1/19.   Tapered off Cardizem drip and on oral metoprolol and oral Cardizem CD.  Will add empiric antibiotics for pancreatitis since procalcitonin elevated and patient does have a white count. 1/20.  Consulted general surgery because I do not have a reason for the pancreatitis.  General surgery ordered for a repeat CAT scan.  Holding Eliquis and converting over to heparin drip just in case surgery needed. 1/21.  Patient pulled out her IV and wanted to go home.  Patient was reoriented and then agreeable to surgery.  General surgery canceled procedure today.  Will advance to full liquid diet.  Assessment and Plan: * Acute pancreatitis Acute pancreatitis.  Consulted general surgery just in case this is a gallbladder cause.  The patient does not drink alcohol.  Triglycerides normal range.  No new medications.  IgG 4 is pending.  IV fluid hydration.  With elevated procalcitonin and white blood cell count will continue empiric Zosyn.  As needed pain and nausea medications.  Discontinued IV pain medications this morning.  Acute metabolic encephalopathy Likely secondary to IV pain medications.  Will discontinue this.  Careful with oral pain medications.  Atrial fibrillation with RVR (HCC) Chronic in nature.  Continue metoprolol and Cardizem CD.  Heart rate acceptable with abdominal pain.  Since patient pulled out her IV.  Spoke with pharmacist about potentially doing a Lovenox injection until IV obtained and may be starting heparin drip early morning.  Obesity (BMI 30-39.9) BMI 33.45 kg/m2   HOCM (hypertrophic obstructive cardiomyopathy) ([place]) Patient with a history of HOCM Continue metoprolol.   Hypothyroidism Continue Synthroid  GERD (gastroesophageal reflux disease) Continue PPI  Subjective: Called to see the patient this morning because she pulled out the IV and wanted to go home.  Patient stated she is not having any abdominal pain and wanted to go home.  When her husband came in we  talked more and then she was agreeable to proceed with surgery.  Dr. Dahlia Byes canceled surgery today.  IV pain medications discontinued.  Physical Exam: Vitals:   09/18/22 0403 09/18/22 0508 09/18/22 0740 09/18/22 1255  BP: 128/76 123/73 131/71 (!) 145/70  Pulse: 95  91 96  Resp: 20 16 16 16   Temp: 98.9 F (37.2 C) 98.7 F (37.1 C) 97.8 F (36.6 C) 98.8 F (37.1 C)  TempSrc: Oral Oral Oral Oral  SpO2: 93% 97% 99% 98%  Weight:      Height:       Physical Exam HENT:     Head: Normocephalic.     Mouth/Throat:     Pharynx: No oropharyngeal exudate.  Eyes:     General: Lids are normal.     Conjunctiva/sclera: Conjunctivae normal.  Cardiovascular:     Rate and Rhythm: Normal rate and regular rhythm.     Heart sounds: Normal heart sounds, S1 normal and S2 normal.  Pulmonary:     Breath sounds: No decreased breath sounds, wheezing, rhonchi or rales.  Abdominal:     Palpations: Abdomen is soft.     Tenderness: There is generalized abdominal tenderness.  Musculoskeletal:     Right lower leg: Swelling present.     Left lower leg: Swelling present.  Skin:    General: Skin is warm.     Findings: No rash.  Neurological:     Mental Status: She is alert.     Comments: Patient re-oriented and moving all extremities to command.     Data Reviewed: Sodium 131, creatinine 0.74, total bilirubin 2.8, white blood cell count 12.0, hemoglobin 11.1 Family Communication: Spoke with husband at the bedside and then the hallway  Disposition: Status is: Inpatient Remains inpatient appropriate because: Surgery for cholecystectomy canceled today with her pulling out the IV and wanting to go home.  IV pain medications discontinued.  Planned Discharge Destination: Home    Time spent: 35 minutes  Author: Loletha Grayer, MD 09/18/2022 2:03 PM  For on call review [email or web address].

## 2022-09-19 ENCOUNTER — Inpatient Hospital Stay: Payer: BC Managed Care – PPO

## 2022-09-19 DIAGNOSIS — K8689 Other specified diseases of pancreas: Secondary | ICD-10-CM | POA: Diagnosis not present

## 2022-09-19 DIAGNOSIS — K8501 Idiopathic acute pancreatitis with uninfected necrosis: Secondary | ICD-10-CM | POA: Diagnosis not present

## 2022-09-19 DIAGNOSIS — I4891 Unspecified atrial fibrillation: Secondary | ICD-10-CM | POA: Diagnosis not present

## 2022-09-19 DIAGNOSIS — K859 Acute pancreatitis without necrosis or infection, unspecified: Secondary | ICD-10-CM | POA: Diagnosis not present

## 2022-09-19 DIAGNOSIS — E669 Obesity, unspecified: Secondary | ICD-10-CM | POA: Diagnosis not present

## 2022-09-19 DIAGNOSIS — G9341 Metabolic encephalopathy: Secondary | ICD-10-CM | POA: Diagnosis not present

## 2022-09-19 LAB — COMPREHENSIVE METABOLIC PANEL
ALT: 18 U/L (ref 0–44)
AST: 26 U/L (ref 15–41)
Albumin: 2.4 g/dL — ABNORMAL LOW (ref 3.5–5.0)
Alkaline Phosphatase: 44 U/L (ref 38–126)
Anion gap: 6 (ref 5–15)
BUN: 14 mg/dL (ref 8–23)
CO2: 24 mmol/L (ref 22–32)
Calcium: 8.2 mg/dL — ABNORMAL LOW (ref 8.9–10.3)
Chloride: 100 mmol/L (ref 98–111)
Creatinine, Ser: 0.65 mg/dL (ref 0.44–1.00)
GFR, Estimated: >60 mL/min (ref >60)
Glucose, Bld: 96 mg/dL (ref 70–99)
Potassium: 3.7 mmol/L (ref 3.5–5.1)
Sodium: 130 mmol/L — ABNORMAL LOW (ref 135–145)
Total Bilirubin: 3.6 mg/dL — ABNORMAL HIGH (ref 0.3–1.2)
Total Protein: 5.6 g/dL — ABNORMAL LOW (ref 6.5–8.1)

## 2022-09-19 LAB — CBC
HCT: 33.1 % — ABNORMAL LOW (ref 36.0–46.0)
Hemoglobin: 11.1 g/dL — ABNORMAL LOW (ref 12.0–15.0)
MCH: 29.2 pg (ref 26.0–34.0)
MCHC: 33.5 g/dL (ref 30.0–36.0)
MCV: 87.1 fL (ref 80.0–100.0)
Platelets: 207 10*3/uL (ref 150–400)
RBC: 3.8 MIL/uL — ABNORMAL LOW (ref 3.87–5.11)
RDW: 13.8 % (ref 11.5–15.5)
WBC: 10 10*3/uL (ref 4.0–10.5)
nRBC: 0 % (ref 0.0–0.2)

## 2022-09-19 LAB — HEPARIN LEVEL (UNFRACTIONATED): Heparin Unfractionated: 0.71 IU/mL — ABNORMAL HIGH (ref 0.30–0.70)

## 2022-09-19 LAB — APTT
aPTT: 52 seconds — ABNORMAL HIGH (ref 24–36)
aPTT: 58 seconds — ABNORMAL HIGH (ref 24–36)
aPTT: 51 seconds — ABNORMAL HIGH (ref 24–36)

## 2022-09-19 LAB — LIPASE, BLOOD: Lipase: 26 U/L (ref 11–51)

## 2022-09-19 MED ORDER — PANTOPRAZOLE SODIUM 40 MG IV SOLR
40.0000 mg | Freq: Two times a day (BID) | INTRAVENOUS | Status: DC
  Administered 2022-09-19 – 2022-10-05 (×32): 40 mg via INTRAVENOUS
  Filled 2022-09-19 (×32): qty 10

## 2022-09-19 MED ORDER — HEPARIN BOLUS VIA INFUSION
1300.0000 [IU] | Freq: Once | INTRAVENOUS | Status: AC
  Administered 2022-09-19: 1300 [IU] via INTRAVENOUS
  Filled 2022-09-19: qty 1300

## 2022-09-19 MED ORDER — FUROSEMIDE 10 MG/ML IJ SOLN
40.0000 mg | Freq: Once | INTRAMUSCULAR | Status: AC
  Administered 2022-09-19: 40 mg via INTRAVENOUS
  Filled 2022-09-19: qty 4

## 2022-09-19 MED ORDER — GADOBUTROL 1 MMOL/ML IV SOLN
10.0000 mL | Freq: Once | INTRAVENOUS | Status: AC | PRN
  Administered 2022-09-19: 10 mL via INTRAVENOUS

## 2022-09-19 NOTE — Progress Notes (Addendum)
Progress Note   Patient: Molly Brown ACZ:[id number] DOB: 16-Sep-1950 DOA: 09/15/2022     4 DOS: the patient was seen and examined on 09/19/2022   Brief hospital course: 72 y.o. 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 Patient presents for evaluation of sudden onset abdominal pain mostly in the epigastrium, periumbilical area.  She rates her pain a 10 x 10 in intensity at its worst.  Pain is nonradiating and is associated with nausea and multiple episodes of emesis.  She states that she has had issues with abdominal pain in the past usually self-limiting but this is the worst pain she has had which prompted her visit to the emergency room.  She is unable to tell me if her prior episodes of pain related to meals.  She has urinary frequency and has dyspnea with exertion which is chronic.  Her last bowel movement was 2 days prior to this admission.  According to the patient she usually has daily bowel movements She denies having any fever, no chills, no chest pain, no headache, no dizziness, no lightheadedness, no cough, no blurred vision no focal deficit. Abnormal labs include lactic acid of 2.0, lipase 539, troponin 24, white count 15.8 CT scan of abdomen and pelvis shows acute interstitial pancreatitis. No evidence of pancreatic necrosis, pancreatic ductal dilation or walled-off collections. Calcifications of the mitral annulus with left atrial dilation, consider further evaluation with cardiac echo if not previously performed. Mild pulmonary edema. Aortic Atherosclerosis. Twelve-lead EKG reviewed by me shows rapid A-fib Patient received 2 L IV fluid bolus, Cardizem 20 mg IV push x 1 and is currently on a Cardizem drip.  1/19.   Tapered off Cardizem drip and on oral metoprolol and oral Cardizem CD.  Will add empiric antibiotics for pancreatitis since procalcitonin elevated and patient does have a white count. 1/20.  Consulted general surgery because I do not have a reason for the pancreatitis.  General surgery ordered for a repeat CAT scan.  Holding Eliquis and converting over to heparin drip just in case surgery needed. 1/21.  Patient pulled out her IV and wanted to go home.  Patient was reoriented and then agreeable to surgery.  General surgery canceled procedure today.  Will advance to full liquid diet. 1/22.  With bilirubin going up I ordered an MRCP.  MRCP showing acute interstitial edematous pancreatitis with peripancreatic fluid collections with some signs of hemorrhagic or proteinaceous change.  Some peripancreatic necrosis seen.  Pancreatic ascites.  Rather striking tapered narrowing of the mid common bile duct with biliary enhancement.  Dilated main pancreatic duct.  Findings may reflect pancreatic divisum or dominant dorsal drainage via minor papilla.  Assessment and Plan: * Severe acute pancreatitis Acute severe pancreatitis with signs of pancreatic necrosis and fluid collections.  Supportive care.  Currently on full liquid diet.  May end up needing feeding tube and tube feeds if does not eat better.  On empiric Zosyn at this point.  General surgery will not remove gallbladder at this point.  IgG4 pending.  The patient does not drink alcohol.  No new medications.  Triglycerides normal.  MRCP shows the possibility of pancreatic divisum which could increase risk of pancreatitis.  IV fluids.  Acute metabolic encephalopathy Improved today after discontinuing IV pain medications.  Atrial fibrillation  with RVR ([place]) Chronic in nature.  Continue metoprolol and Cardizem CD.  Currently on heparin drip.  Obesity (BMI 30-39.9) BMI 33.45 kg/m2   HOCM (hypertrophic obstructive cardiomyopathy) ([place]) Patient with a history  of HOCM Continue metoprolol.   Hypothyroidism Continue Synthroid  GERD (gastroesophageal reflux disease) Continue PPI        Subjective: Patient not having abdominal pain but states it does not feel right in her abdomen.  Did not drink much of the liquids yesterday.  Bilirubin a little bit up today so I ordered an MRCP which showed pancreatic necrosis.  Physical Exam: Vitals:   09/19/22 0103 09/19/22 0344 09/19/22 0853 09/19/22 1134  BP: 128/73 (!) 123/56 138/64 122/81  Pulse: 95 79 82 96  Resp: 20 17 16 18   Temp: 98.5 F (36.9 C) 98.7 F (37.1 C) 99.3 F (37.4 C) 98.3 F (36.8 C)  TempSrc: Oral Oral Oral   SpO2: 99% 93% 99% 94%  Weight:      Height:       Physical Exam HENT:     Head: Normocephalic.     Mouth/Throat:     Pharynx: No oropharyngeal exudate.  Eyes:     General: Lids are normal.     Conjunctiva/sclera: Conjunctivae normal.  Cardiovascular:     Rate and Rhythm: Normal rate. Rhythm irregularly irregular.     Heart sounds: Normal heart sounds, S1 normal and S2 normal.  Pulmonary:     Breath sounds: Examination of the right-lower field reveals decreased breath sounds. Examination of the left-lower field reveals decreased breath sounds. Decreased breath sounds present. No wheezing, rhonchi or rales.  Abdominal:     Palpations: Abdomen is soft.     Tenderness: There is generalized abdominal tenderness.  Musculoskeletal:     Right lower leg: Swelling present.     Left lower leg: Swelling present.  Skin:    General: Skin is warm.     Findings: No rash.  Neurological:     Mental Status: She is alert.     Comments: Patient answering all questions appropriately.     Data Reviewed: Sodium 130, total bilirubin 3.6, alkaline phosphatase 44  Family Communication: Spoke with husband at the bedside this morning and husband on the phone and sister on the phone this afternoon.  Disposition: Status is: Inpatient Remains inpatient appropriate because:  Patient has severe pancreatitis with necrosis.  Planned Discharge Destination: Home    Time spent: 28 minutes  Author: Loletha Grayer, MD 09/19/2022 3:32 PM  For on call review [email or web address].

## 2022-09-19 NOTE — Consult Note (Signed)
[place] for IV Heparin Indication: atrial fibrillation  Patient Measurements: Height: 5\' 8"  (172.7 cm) Weight: 99.8 kg (220 lb) IBW/kg (Calculated) : 63.9 Heparin Dosing Weight: 85.8 kg  Labs: Recent Labs    09/17/22 0452 09/17/22 1734 09/18/22 0525 09/19/22 0319 09/19/22 0816  HGB 13.1  --  11.1* 11.1*  --   HCT 39.5  --  33.8* 33.1*  --   PLT 190  --  209 207  --   APTT  --  79*  --   --  52*  HEPARINUNFRC  --   --   --   --  0.71*  CREATININE 0.77  --  0.74 0.65  --      Estimated Creatinine Clearance: 82 mL/min (by C-G formula based on SCr of 0.65 mg/dL).   Medical History: Past Medical History:  Diagnosis Date   AF (atrial fibrillation) (HCC)    Anal fissure    Anxiety    Cardiac arrhythmia due to congenital heart disease    Chicken pox    Depression    GERD (gastroesophageal reflux disease)    Heart murmur     Medications:  Apixaban 5 mg BID (last dose 09/16/22 at 2251)  Assessment: 72 y/o F with medical history including Afib on apixaban who is admitted with acute pancreatitis. General surgery consulted. Pharmacy consulted to initiate and manage heparin infusion while apixaban is on hold pending surgery evaluation. Heparin was stopped early morning 1/21 @ 0300 for surgery but patient then became not amenable to procedure, pulled out her IV, and wanted to go home. She was reoriented and agreed to surgery, which was subsequently postponed. In order to allow time for new IV placement, per discussion with MD, we opted to give a one-time dose of enoxaparin 1 mg/kg to provide anticoagulation until IV access could be established again for heparin. IV access established again 1/21 @ ~1530.  1/20 1734 aPTT 79 before heparin started. No baseline heparin level ordered prior to giving dose of enoxaparin.  1/22 0816 aPTT 52 HL 0.71  Goal of Therapy:  Heparin level 0.3-0.7 units/ml once aPTT and heparin level correlate.  aPTT 66 -  102 seconds Monitor platelets by anticoagulation protocol: Yes  Plan: aPTT is subtherapeutic, not correlating with heparin level. Will give heparin bolus of 1300 units x 1 and increase heparin infusion to 1400 units/hr. Recheck aPTT in 6 hours. CBC and heparin level with AM labs. Switch to heparin level monitoring once aPTT and heparin level are correlating. Possible plan for procedure on 09/21/22 per surgery.   Eleonore Chiquito, PharmD 09/19/2022 9:27 AM

## 2022-09-19 NOTE — Progress Notes (Addendum)
CC: pancreatitis Subjective: She feels better, however MRCP shows worsening pancreatitis w areas of focal necrosis. No evidence of infected necrotizing pancreatitis   Objective: Vital signs in last 24 hours: Temp:  [98.3 F (36.8 C)-99.3 F (37.4 C)] 98.3 F (36.8 C) (01/22 1134) Pulse Rate:  [79-96] 96 (01/22 1134) Resp:  [16-20] 18 (01/22 1134) BP: (122-138)/(56-84) 122/81 (01/22 1134) SpO2:  [93 %-99 %] 94 % (01/22 1134) Last BM Date : 09/13/22  Intake/Output from previous day: No intake/output data recorded. Intake/Output this shift: No intake/output data recorded.  Physical exam:  CONSTITUTIONAL: Chronically ill. EYES: Pupils are equal, round, and reactive to light, Sclera are non-icteric. EARS, NOSE, MOUTH AND THROAT: The oropharynx is clear. The oral mucosa is pink and moist. Hearing is intact to voice. LYMPH NODES:  Lymph nodes in the neck are normal. RESPIRATORY:  Lungs are clear. There is normal respiratory effort, with equal breath sounds bilaterally, and without pathologic use of accessory muscles. CARDIOVASCULAR: Heart is regular without murmurs, gallops, or rubs. GI: The abdomen is  soft, non tender w significant clinincal improvement. There are no palpable masses. There is no hepatosplenomegaly. There are normal bowel sounds in all quadrants. GU: Rectal deferred.   MUSCULOSKELETAL: Normal muscle strength and tone. No cyanosis or edema.   SKIN: Turgor is good and there are no pathologic skin lesions or ulcers. NEUROLOGIC: Motor and sensation is grossly normal. Cranial nerves are grossly intact. PSYCH:  Oriented to person, place and time. Affect is normal.   Lab Results: CBC  Recent Labs    09/18/22 0525 09/19/22 0319  WBC 12.0* 10.0  HGB 11.1* 11.1*  HCT 33.8* 33.1*  PLT 209 207   BMET Recent Labs    09/18/22 0525 09/19/22 0319  NA 131* 130*  K 3.8 3.7  CL 100 100  CO2 23 24  GLUCOSE 98 96  BUN 17 14  CREATININE 0.74 0.65  CALCIUM 8.3* 8.2*    PT/INR No results for input(s): "LABPROT", "INR" in the last 72 hours. ABG No results for input(s): "PHART", "HCO3" in the last 72 hours.  Invalid input(s): "PCO2", "PO2"  Studies/Results: MR 3D Recon At Scanner  Result Date: 09/19/2022 CLINICAL DATA:  MIP reconstructions of the biliary tree which were generated for the MRCP, MRI of the abdomen with and without contrast which is reported separately. EXAM: 3-DIMENSIONAL MR IMAGE RENDERING ON ACQUISITION WORKSTATION TECHNIQUE: 3-dimensional MR images were rendered by post-processing of the original MR data on an acquisition workstation. The 3-dimensional MR images were interpreted and findings were reported in the accompanying complete MR report for this study COMPARISON:  MRCP reported separately. FINDINGS: MIP reconstructions in 3D were provided for interpretation of MRCP/MRI of the abdomen with and without contrast. The report is found under the a session number [id number] [facility]. IMPRESSION: Please refer to report for MRI abdomen/MRCP with a session number outlined above. Electronically Signed   By: Zetta Bills M.D.   On: 09/19/2022 14:47   MR ABDOMEN MRCP W WO CONTAST  Result Date: 09/19/2022 CLINICAL DATA:  Acute, severe pancreatitis. EXAM: MRI ABDOMEN WITHOUT AND WITH CONTRAST (INCLUDING MRCP) TECHNIQUE: Multiplanar multisequence MR imaging of the abdomen was performed both before and after the administration of intravenous contrast. Heavily T2-weighted images of the biliary and pancreatic ducts were obtained, and three-dimensional MRCP images were rendered by post processing. CONTRAST:  57mL GADAVIST GADOBUTROL 1 MMOL/ML IV SOLN COMPARISON:  September 15, 2022 and CT imaging from September 17, 2022 FINDINGS: Lower chest: Small bilateral pleural  effusions. Basilar atelectasis. Cardiomegaly, lung bases not well assessed. Bilateral breast implants in place. Hepatobiliary: Motion limited assessment of hepatic parenchyma without gross signs of focal,  suspicious hepatic abnormality. Gallbladder mild-to-moderately distended with signs of pericholecystic fluid which is mildly increased compared to previous CT imaging from September 17, 2022 and is associated with perihepatic fluid and inflammation in the anterior pararenal space. No signs of biliary duct dilation. No visible cholelithiasis. Attenuation of the distal common bile duct is smooth and long segment in the intrapancreatic portion of the common bile duct. Dilated main pancreatic duct is mildly irregular and is only mildly dilated. Suspect small Santorinicele at the minor papilla (image 33/15) configuration of ductal structures suggests either pancreatic divisum or dominant dorsal drainage via minor papilla which can be a risk factor for pancreatitis. Cystic duct inserts upon the midportion of the extrahepatic biliary tree just above the pancreatic portion of the common bile duct. Pancreas: Edema within the pancreas. Intrinsic T1 signal in the pancreas is largely preserved as is enhancement. Marked peripancreatic stranding. 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 Peripancreatic fluid collections some with signs of hemorrhagic or proteinaceous change, for instance in the lesser sac and area measuring 2.3 x 2.0 cm (image 48/19) fluid in the abdomen predominantly low on T1 and high on T2 with edema and hemorrhagic/proteinaceous material tracking throughout the root of the small bowel mesentery. Assessment of upper abdominal structures with markedly limited evaluation due to respiratory variation and motion. Portal vein and splenic vein remain patent. There is a small collection with mild mass effect posterior to the portal vein best seen on image 53/21, this measures 18 x 14 mm present on previous imaging, perhaps slightly enlarged. Spleen:  Splenic vein is patent.  Spleen normal size. Adrenals/Urinary Tract:  Adrenal glands are normal. Kidneys with smooth contours and Bosniak category II cysts for which  no additional dedicated follow-up imaging is recommended. Largest 13 mm in the lower pole of the RIGHT kidney. No perinephric stranding. No hydronephrosis. Stomach/Bowel: Marked duodenal edema. Inflammation and likely fat necrosis or early fat necrosis at the root of the small bowel mesentery. Mild distension of the colon with: Cut off the splenic flexure. Colon at 5 cm currently. Vascular/Lymphatic: No pathologically enlarged lymph nodes identified. No abdominal aortic aneurysm demonstrated. Other:  Flank edema bilaterally. Musculoskeletal: No suspicious bone lesions identified. IMPRESSION: 1. Findings of acute interstitial edematous pancreatitis with peripancreatic fluid collections some with signs of hemorrhagic or proteinaceous change. Appearance of peripancreatic changes with some worsening since prior imaging suggestive peripancreatic necrosis. Pancreatic ascites and more simple appearing fluid elsewhere with slight interval increase. 2. Scattered areas of acute pancreatic fluid and or peripancreatic necrosis associated with mass effect upon the portal vein and root of the small bowel mesentery. Venous structures remain patent. Short interval follow-up may be warranted to ensure continued patency. 3. No cholelithiasis or signs of choledocholithiasis. 4. Rather striking tapered narrowing of the mid common bile duct with biliary enhancement. This may be reactive secondary to pancreatitis and is not associated currently with biliary duct dilation. Given appearance of the biliary tree would also correlate with any laboratory evidence of IGg4 related disease that could explain biliary and pancreatic abnormalities though pancreatitis could be related to potential anatomic abnormality described below. 5. Dilated main pancreatic duct is mildly irregular and is only mildly dilated. Suspect small Santorinicele at the minor papilla. Findings may reflect either pancreatic divisum or dominant dorsal drainage via minor  papilla which can be a risk factor  for pancreatitis. In general assessment is limited by respiratory and patient related motion. 6. Marked duodenal edema if there are continued symptoms which do not follow the trend of expected evolution for pancreatitis and or pancreatic enzymes could consider the possibility of either severe secondary or primary duodenitis. 7. Inflammation of affecting the splenic flexure of the colon with upstream dilation is a common site of functional colonic narrowing or obstruction in the setting of pancreatitis. Suggest attention on follow-up. 8. Small bilateral pleural effusions and basilar atelectasis. 9. Motion limited assessment of hepatic parenchyma without gross signs of focal, suspicious hepatic abnormality. Electronically Signed   By: Zetta Bills M.D.   On: 09/19/2022 14:19    Anti-infectives: Anti-infectives (From admission, onward)    Start     Dose/Rate Route Frequency Ordered Stop   09/16/22 1415  piperacillin-tazobactam (ZOSYN) IVPB 3.375 g        3.375 g 12.5 mL/hr over 240 Minutes Intravenous Every 8 hours 09/16/22 1410         Assessment/Plan:  Severe Pancreatitis of unknown etiology. MRI showing some areas of necrosis and worsening as compared to CT.  Given these findings I do not recommend cholecystectomy during this admission. May benefit from postpyloric feeding tube versus TPN May need GI collaboration as well. Will plan to follow her up as an outpatient after her pancreatitis has resolved No Need for surgical intervention at this time We will be available Note that I have spent greater than 35 minutes in this encounter including personally reviewing imaging studies and coordinating her care  Caroleen Hamman, MD, [facility]  09/19/2022

## 2022-09-19 NOTE — Consult Note (Signed)
[place] for IV Heparin Indication: atrial fibrillation  Patient Measurements: Height: 5\' 8"  (172.7 cm) Weight: 99.8 kg (220 lb) IBW/kg (Calculated) : 63.9 Heparin Dosing Weight: 85.8 kg  Labs: Recent Labs    09/17/22 0452 09/17/22 1734 09/18/22 0525 09/19/22 0319 09/19/22 0816 09/19/22 1615  HGB 13.1  --  11.1* 11.1*  --   --   HCT 39.5  --  33.8* 33.1*  --   --   PLT 190  --  209 207  --   --   APTT  --  79*  --   --  52* 58*  HEPARINUNFRC  --   --   --   --  0.71*  --   CREATININE 0.77  --  0.74 0.65  --   --      Estimated Creatinine Clearance: 82 mL/min (by C-G formula based on SCr of 0.65 mg/dL).   Medical History: Past Medical History:  Diagnosis Date   AF (atrial fibrillation) (HCC)    Anal fissure    Anxiety    Cardiac arrhythmia due to congenital heart disease    Chicken pox    Depression    GERD (gastroesophageal reflux disease)    Heart murmur     Medications:  Apixaban 5 mg BID (last dose 09/16/22 at 2251)  Assessment: 72 y/o F with medical history including Afib on apixaban who is admitted with acute pancreatitis. General surgery consulted. Pharmacy consulted to initiate and manage heparin infusion while apixaban is on hold pending surgery evaluation. Heparin was stopped early morning 1/21 @ 0300 for surgery but patient then became not amenable to procedure, pulled out her IV, and wanted to go home. She was reoriented and agreed to surgery, which was subsequently postponed. In order to allow time for new IV placement, per discussion with MD, we opted to give a one-time dose of enoxaparin 1 mg/kg to provide anticoagulation until IV access could be established again for heparin. IV access established again 1/21 @ ~1530.  1/20 1734 aPTT 79 before heparin started. No baseline heparin level ordered prior to giving dose of enoxaparin.  1/22 0816 aPTT 52 HL 0.71 1/22 1615 aPTT 58  Goal of Therapy:  Heparin level  0.3-0.7 units/ml once aPTT and heparin level correlate.  aPTT 66 - 102 seconds Monitor platelets by anticoagulation protocol: Yes  Plan: -aPTT is subtherapeutic.  -Will give heparin bolus of 1300 units x 1 -Increase heparin infusion to 1550 units/hr.  -Recheck aPTT in 6 hours.  -CBC and heparin level with AM labs. Switch to heparin level monitoring once aPTT and heparin level are correlating.   Beatris Si, PharmD 09/19/2022 4:44 PM

## 2022-09-19 NOTE — Progress Notes (Signed)
[facility] Cardiology  Patient ID: Molly Brown MRN: [id number] DOB/AGE: Jul 24, 1951 72 y.o.   Admit date: 09/15/2022 Referring Physician Dr. Francine Graven  Primary Physician  Primary Cardiologist Dr. Nehemiah Massed Reason for Consultation rapid atrial fibrillation  HPI: Molly Brown is a 71YOF with a PMH of HOCM (EF >55% 12/2021), chronic AF on eliquis, OSA, HLD, who presented to [facility] ED 09/15/2022 with sudden onset abdominal pain x 1 day.  She was in AF RVR on presentation, CT abd/pelv concerning for acute interstitial pancreatitis, gallstone pancreatitis suspected by gen surgery, patient was agreeable and surgery initially planned for 1/21 but cancelled d/t patient pulling out her IV and wanting to go home.    Interval History: -Abdominal pain improving today, she is calm and cooperative -No chest pain, slight shortness of breath, on 3 [place] with none at baseline. -Trace peripheral edema -Plan for MRCP today  Vitals:   09/18/22 2024 09/19/22 0103 09/19/22 0344 09/19/22 0853  BP: 137/84 128/73 (!) 123/56 138/64  Pulse: 85 95 79 82  Resp: 17 20 17 16   Temp: 98.9 F (37.2 C) 98.5 F (36.9 C) 98.7 F (37.1 C) 99.3 F (37.4 C)  TempSrc: Oral Oral Oral Oral  SpO2: 97% 99% 93% 99%  Weight:      Height:        No intake or output data in the 24 hours ending 09/19/22 0956   PHYSICAL EXAM General: ill-appearing Caucasian female, well nourished, in no acute distress.  Sitting upright in bed with sister at bedside. HEENT:  Normocephalic and atraumatic. Neck:   No JVD.  Lungs: Normal respiratory effort on oxygen by nasal cannula.  Bibasilar crackles without appreciable wheezes  Heart: Irregularly irregular with controlled rate. Normal S1 and S2 .  Blowing holosystolic murmur heard throughout Abdomen: Mildly-distended appearing.  Msk: Normal strength and tone for age. Extremities: No clubbing, cyanosis.  Trace edema.  Neuro: Alert and oriented x3 Psych: Calm and cooperative,     LABS: Basic Metabolic  Panel: Recent Labs    09/18/22 0525 09/19/22 0319  NA 131* 130*  K 3.8 3.7  CL 100 100  CO2 23 24  GLUCOSE 98 96  BUN 17 14  CREATININE 0.74 0.65  CALCIUM 8.3* 8.2*    Liver Function Tests: Recent Labs    09/18/22 0525 09/19/22 0319  AST 19 26  ALT 14 18  ALKPHOS 46 44  BILITOT 2.8* 3.6*  PROT 6.1* 5.6*  ALBUMIN 2.7* 2.4*    Recent Labs    09/18/22 0525 09/19/22 0319  LIPASE 29 26    CBC: Recent Labs    09/18/22 0525 09/19/22 0319  WBC 12.0* 10.0  HGB 11.1* 11.1*  HCT 33.8* 33.1*  MCV 87.3 87.1  PLT 209 207    Cardiac Enzymes: No results for input(s): "CKTOTAL", "CKMB", "CKMBINDEX", "TROPONINI" in the last 72 hours. BNP: Invalid input(s): "POCBNP" D-Dimer: No results for input(s): "DDIMER" in the last 72 hours. Hemoglobin A1C: No results for input(s): "HGBA1C" in the last 72 hours. Fasting Lipid Panel: No results for input(s): "CHOL", "HDL", "LDLCALC", "TRIG", "CHOLHDL", "LDLDIRECT" in the last 72 hours.  Thyroid Function Tests: No results for input(s): "TSH", "T4TOTAL", "T3FREE", "THYROIDAB" in the last 72 hours.  Invalid input(s): "FREET3" Anemia Panel: No results for input(s): "VITAMINB12", "FOLATE", "FERRITIN", "TIBC", "IRON", "RETICCTPCT" in the last 72 hours.  CT ABD PELVIS W/WO CM ONCOLOGY PANCREATIC PROTOCOL  Result Date: 09/17/2022 CLINICAL DATA:  Acute pancreatitis EXAM: CT ABDOMEN AND PELVIS WITHOUT AND WITH CONTRAST TECHNIQUE: Multidetector  CT imaging of the abdomen and pelvis was performed following the standard protocol before and following the bolus administration of intravenous contrast. RADIATION DOSE REDUCTION: This exam was performed according to the departmental dose-optimization program which includes automated exposure control, adjustment of the mA and/or kV according to patient size and/or use of iterative reconstruction technique. CONTRAST:  142mL OMNIPAQUE IOHEXOL 350 MG/ML SOLN COMPARISON:  09/15/2022 FINDINGS: Lower chest:  New small bilateral pleural effusions and associated atelectasis or consolidation. Cardiomegaly with severe left atrial dilatation. Coronary artery calcifications. Partially imaged breast implants. Hepatobiliary: No solid liver abnormality is seen. Excreted contrast in the gallbladder. No gallstones, gallbladder wall thickening, or biliary dilatation. Pancreas: Diffuse fat stranding and inflammatory fluid about the pancreas and adjacent retroperitoneum, very similar to prior examination. No pancreatic parenchymal hypoenhancement or discrete pancreatic fluid collection. Spleen: Normal in size without significant abnormality. Adrenals/Urinary Tract: Adrenal glands are unremarkable. Kidneys are normal, without renal calculi, solid lesion, or hydronephrosis. Excreted contrast in the bladder. Bladder is otherwise unremarkable. Stomach/Bowel: Stomach is within normal limits. Appendix appears normal. No evidence of bowel wall thickening, distention, or inflammatory changes. Descending and sigmoid diverticulosis. Vascular/Lymphatic: Aortic atherosclerosis. Unchanged prominent portacaval, retroperitoneal, and mesenteric lymph nodes. Reproductive: No mass or other significant abnormality. Other: No abdominal wall hernia or abnormality. Increased small volume ascites throughout the abdomen and pelvis. Musculoskeletal: No acute or significant osseous findings. IMPRESSION: 1. Diffuse fat stranding and inflammatory fluid about the pancreas and adjacent retroperitoneum, very similar to prior examination and consistent with acute pancreatitis. No pancreatic parenchymal hypoenhancement to suggest necrosis nor discrete pancreatic fluid collection. 2. Increased small volume ascites throughout the abdomen and pelvis. 3. New small bilateral pleural effusions and associated atelectasis or consolidation. 4. Cardiomegaly with severe left atrial dilatation. Coronary artery disease. Aortic Atherosclerosis (ICD10-I70.0). Electronically Signed    By: Delanna Ahmadi M.D.   On: 09/17/2022 14:41     Echo EF>55%, asymmetric septal hypertrophy consistent with hypertrophic obstructive cardiomyopathy, moderate mitral regurgitation  TELEMETRY: Atrial fibrillation 90s - low  100s:  ASSESSMENT AND PLAN:  Principal Problem:   Acute pancreatitis Active Problems:   GERD (gastroesophageal reflux disease)   Hypothyroidism   Atrial fibrillation with RVR (HCC)   HOCM (hypertrophic obstructive cardiomyopathy) (HCC)   Obesity (BMI 30-39.9)   Acute metabolic encephalopathy    1. Chronic atrial fibrillation, mildly elevated heart rate in the setting of pancreatitis, on Eliquis for stroke prevention, switched to heparin infusion preparation for possible cholecystectomy, s/p diltiazem infusion, now on Cardizem CD 180 mg Molly and metoprolol tartrate 50 mg twice Molly for rate control. 2.  Acute pancreatitis, currently managed conservatively, on IV zosyn, MRCP ordered today 3.  Hypertrophic obstructive cardiomyopathy, asymmetric septal hypertrophy (SWT 2.0 cm ) observed on 2D echocardiogram 12/30/2021.  Trace crackles and peripheral edema on exam today, not grossly hypervolemic otherwise   Recommendations   1.  Agree with current therapy 2.  Continue heparin for now, if do not proceed with cholecystectomy, resume Eliquis for stroke prevention 3.  Continue Cardizem CD and metoprolol tartrate for rate control 4.  Will dose IV Lasix 40 mg x 1, consider repeat dosing tomorrow. 5.  Follow-up with Dr. Clayborn Bigness after discharge  This patient's plan of care was discussed and created with Dr. Saralyn Pilar and he is in agreement.    Tristan Schroeder, PA-C 09/19/2022 9:56 AM

## 2022-09-19 NOTE — Consult Note (Signed)
[name], MD [address]  [address]  [place], [place] [address]  Main: [phone]  Fax: [phone] Pager: [phone]   Consultation  Referring Provider:     No ref. provider found Primary Care Physician:  Patient, No Pcp Per Primary Gastroenterologist: Unassigned      Reason for Consultation: Acute necrotizing pancreatitis  Date of Admission:  09/15/2022 Date of Consultation:  09/19/2022         HPI:   Molly Brown is a 72 y.o. female with history of A-fib on Eliquis, chronic GERD, obesity who is admitted on 09/15/2022 secondary to epigastric pain radiating to the back, associated with nausea and vomiting, found to have acute pancreatitis.  Etiology of acute pancreatitis is not clear, general surgery has been consulted which has been deferred at this time due to repeat imaging which revealed acute necrotizing hemorrhagic pancreatitis.  Eliquis has been discontinued and heparin drip has been initiated as recommended by general surgery given history of underlying A-fib.  When I interviewed the patient, she was complaining of generalized abdominal discomfort, worse on liquid diet.  Patient thought she was feeling better this morning, but after consuming Brown liquids, she started experiencing worsening of abdominal pain.  Patient's daughter and her husband are bedside who report that she has been experiencing mild episodes of epigastric pain associated with nausea and vomiting intermittently for last several months and these have been self-limited.  They thought these are gas pain and she used to take Gas-X.  This has been the worst pain she experienced which has brought her to the emergency room.  Patient denies any fever, chills.  She reports passing some gas.  Patient's husband states that she eats mostly chicken and rice.  Patient's lipase was 539 on admission.  LFTs revealed isolated hyperbilirubinemia, T. bili today is 3.6, predominantly indirect bilirubin.  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  No evidence of choledocholithiasis or cholelithiasis.  There was evidence of smooth tapering with narrowing CBD which was mild, which is thought to be reactive secondary to acute pancreatitis, there was no evidence of biliary duct dilation.  Patient has been started on Zosyn at the time of admission, had mild leukocytosis, Zosyn is continued and her leukocytosis resolved.  She has been afebrile  Patient denies any alcohol use Serum triglycerides were 49  NSAIDs: None  Antiplts/Anticoagulants/Anti thrombotics: Eliquis for history of A-fib  GI Procedures: None  Past Medical History:  Diagnosis Date   AF (atrial fibrillation) (HCC)    Anal fissure    Anxiety    Cardiac arrhythmia due to congenital heart disease    Chicken pox    Depression    GERD (gastroesophageal reflux disease)    Heart murmur     Past Surgical History:  Procedure Laterality Date   ABDOMINAL HYSTERECTOMY     AUGMENTATION MAMMAPLASTY Bilateral 1999   BREAST ENHANCEMENT SURGERY  2000   TONSILLECTOMY  1972   TOOTH EXTRACTION       Current Facility-Administered Medications:    0.9 %  sodium chloride infusion, , Intravenous, Continuous, Alford Highland, MD, Held at 09/18/22 1110   acetaminophen (TYLENOL) tablet 650 mg, 650 mg, Oral, Q6H PRN, Agbata, Tochukwu, MD   Chlorhexidine Gluconate Cloth 2 % PADS 6 each, 6 each, Topical, Q0600, Pabon, Merri Ray, MD,  6 each at 09/18/22 0240   cyanocobalamin (VITAMIN B12) tablet 500 mcg, 500 mcg, Oral, Daily, Agbata, Tochukwu, MD, 500 mcg at 09/19/22 1003   diltiazem (CARDIZEM CD) 24 hr capsule 180 mg, 180 mg, Oral, Daily, Wieting, Richard, MD, 180 mg at 09/19/22 1003   heparin ADULT infusion 100 units/mL (25000 units/273mL),  1,550 Units/hr, Intravenous, Continuous, Lorin Picket, [facility], Last Rate: 15.5 mL/hr at 09/19/22 1734, 1,550 Units/hr at 09/19/22 1734   levothyroxine (SYNTHROID) tablet 25 mcg, 25 mcg, Oral, Q0600, Agbata, Tochukwu, MD, 25 mcg at 09/19/22 0547   metoprolol tartrate (LOPRESSOR) tablet 50 mg, 50 mg, Oral, BID, Agbata, Tochukwu, MD, 50 mg at 09/19/22 2149   multivitamin with minerals tablet 1 tablet, 1 tablet, Oral, Daily, Agbata, Tochukwu, MD, 1 tablet at 09/19/22 1003   ondansetron (ZOFRAN) tablet 4 mg, 4 mg, Oral, Q6H PRN **OR** ondansetron (ZOFRAN) injection 4 mg, 4 mg, Intravenous, Q6H PRN, Agbata, Tochukwu, MD, 4 mg at 09/16/22 1943   oxyCODONE (Oxy IR/ROXICODONE) immediate release tablet 5 mg, 5 mg, Oral, Q4H PRN, Leslye Peer, Richard, MD, 5 mg at 09/19/22 1956   pantoprazole (PROTONIX) injection 40 mg, 40 mg, Intravenous, Q12H, Wieting, Richard, MD, 40 mg at 09/19/22 2149   piperacillin-tazobactam (ZOSYN) IVPB 3.375 g, 3.375 g, Intravenous, Q8H, Nazari, Walid A, RPH, Last Rate: 12.5 mL/hr at 09/19/22 1726, 3.375 g at 09/19/22 1726   polyethylene glycol (MIRALAX / GLYCOLAX) packet 17 g, 17 g, Oral, Daily, Agbata, Tochukwu, MD, 17 g at 09/19/22 1002   Family History  Problem Relation Age of Onset   Arthritis Mother    Heart disease Father    Arthritis Maternal Grandmother    Arthritis Paternal Grandmother    Heart disease Paternal Grandmother    Breast cancer Neg Hx      Social History   Tobacco Use   Smoking status: Former    Types: Cigarettes   Smokeless tobacco: Never  Substance Use Topics   Alcohol use: No   Drug use: No    Allergies as of 09/15/2022 - Review Complete 09/15/2022  Allergen Reaction Noted   Eszopiclone Other (See Comments) 03/18/2016   Flecainide Other (See Comments) and Rash 05/25/2015    Review of Systems:    All systems reviewed and negative except where noted in HPI.   Physical Exam:  Vital signs in last 24 hours: Temp:  [98.3 F (36.8 C)-99.7 F  (37.6 C)] 98.4 F (36.9 C) (01/22 1942) Pulse Rate:  [79-104] 96 (01/22 1942) Resp:  [16-20] 17 (01/22 1942) BP: (118-138)/(56-81) 128/70 (01/22 1942) SpO2:  [93 %-99 %] 98 % (01/22 1942) Last BM Date :  ("over a week ago") General:   Pleasant, cooperative in NAD Head:  Normocephalic and atraumatic. Eyes:   No icterus.   Conjunctiva pink. PERRLA. Ears:  Normal auditory acuity. Neck:  Supple; no masses or thyroidomegaly Lungs: Respirations even and unlabored. Lungs clear to auscultation bilaterally.   No wheezes, crackles, or rhonchi.  Heart:  Regular rate and rhythm;  Without murmur, clicks, rubs or gallops Abdomen:  Soft, diffusely distended, generalized abdominal tenderness, worse in epigastric area, hypoactive bowel sounds. No appreciable masses or hepatomegaly.  No rebound or guarding.  Rectal:  Not performed. Msk:  Symmetrical without gross deformities.  Strength generalized weakness Extremities:  Without edema, cyanosis or clubbing. Neurologic:  Alert and oriented x3;  grossly normal neurologically. Skin:  Intact without significant lesions or rashes. Psych:  Alert and cooperative. Normal affect.  LAB RESULTS:  Latest Ref Rng & Units 09/19/2022    3:19 AM 09/18/2022    5:25 AM 09/17/2022    4:52 AM  CBC  WBC 4.0 - 10.5 K/uL 10.0  12.0  14.4   Hemoglobin 12.0 - 15.0 g/dL 11.1  11.1  13.1   Hematocrit 36.0 - 46.0 % 33.1  33.8  39.5   Platelets 150 - 400 K/uL 207  209  190     BMET    Latest Ref Rng & Units 09/19/2022    3:19 AM 09/18/2022    5:25 AM 09/17/2022    4:52 AM  BMP  Glucose 70 - 99 mg/dL 96  98  109   BUN 8 - 23 mg/dL 14  17  22    Creatinine 0.44 - 1.00 mg/dL 0.65  0.74  0.77   Sodium 135 - 145 mmol/L 130  131  131   Potassium 3.5 - 5.1 mmol/L 3.7  3.8  4.2   Chloride 98 - 111 mmol/L 100  100  99   CO2 22 - 32 mmol/L 24  23  25    Calcium 8.9 - 10.3 mg/dL 8.2  8.3  8.5     LFT    Latest Ref Rng & Units 09/19/2022    3:19 AM 09/18/2022    5:25 AM  09/17/2022    4:52 AM  Hepatic Function  Total Protein 6.5 - 8.1 g/dL 5.6  6.1  6.5   Albumin 3.5 - 5.0 g/dL 2.4  2.7  3.2   AST 15 - 41 U/L 26  19  20    ALT 0 - 44 U/L 18  14  13    Alk Phosphatase 38 - 126 U/L 44  46  47   Total Bilirubin 0.3 - 1.2 mg/dL 3.6  2.8  1.8      STUDIES: MR 3D Recon At Scanner  Result Date: 09/19/2022 CLINICAL DATA:  MIP reconstructions of the biliary tree which were generated for the MRCP, MRI of the abdomen with and without contrast which is reported separately. EXAM: 3-DIMENSIONAL MR IMAGE RENDERING ON ACQUISITION WORKSTATION TECHNIQUE: 3-dimensional MR images were rendered by post-processing of the original MR data on an acquisition workstation. The 3-dimensional MR images were interpreted and findings were reported in the accompanying complete MR report for this study COMPARISON:  MRCP reported separately. FINDINGS: MIP reconstructions in 3D were provided for interpretation of MRCP/MRI of the abdomen with and without contrast. The report is found under the a session number [id number] CH[facility]. IMPRESSION: Please refer to report for MRI abdomen/MRCP with a session number outlined above. Electronically Signed   By: Zetta Bills M.D.   On: 09/19/2022 14:47   MR ABDOMEN MRCP W WO CONTAST  Result Date: 09/19/2022 CLINICAL DATA:  Acute, severe pancreatitis. EXAM: MRI ABDOMEN WITHOUT AND WITH CONTRAST (INCLUDING MRCP) TECHNIQUE: Multiplanar multisequence MR imaging of the abdomen was performed both before and after the administration of intravenous contrast. Heavily T2-weighted images of the biliary and pancreatic ducts were obtained, and three-dimensional MRCP images were rendered by post processing. CONTRAST:  70mL GADAVIST GADOBUTROL 1 MMOL/ML IV SOLN COMPARISON:  September 15, 2022 and CT imaging from September 17, 2022 FINDINGS: Lower chest: Small bilateral pleural effusions. Basilar atelectasis. Cardiomegaly, lung bases not well assessed. Bilateral breast implants in place.  Hepatobiliary: Motion limited assessment of hepatic parenchyma without gross signs of focal, suspicious hepatic abnormality. Gallbladder mild-to-moderately distended with signs of pericholecystic fluid which is mildly increased compared to previous CT imaging from September 17, 2022  and is associated with perihepatic fluid and inflammation in the anterior pararenal space. No signs of biliary duct dilation. No visible cholelithiasis. Attenuation of the distal common bile duct is smooth and long segment in the intrapancreatic portion of the common bile duct. Dilated main pancreatic duct is mildly irregular and is only mildly dilated. Suspect small Santorinicele at the minor papilla (image 33/15) configuration of ductal structures suggests either pancreatic divisum or dominant dorsal drainage via minor papilla which can be a risk factor for pancreatitis. Cystic duct inserts upon the midportion of the extrahepatic biliary tree just above the pancreatic portion of the common bile duct. Pancreas: Edema within the pancreas. Intrinsic T1 signal in the pancreas is largely preserved as is enhancement. Marked peripancreatic stranding. 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 Peripancreatic fluid collections some with signs of hemorrhagic or proteinaceous change, for instance in the lesser sac and area measuring 2.3 x 2.0 cm (image 48/19) fluid in the abdomen predominantly low on T1 and high on T2 with edema and hemorrhagic/proteinaceous material tracking throughout the root of the small bowel mesentery. Assessment of upper abdominal structures with markedly limited evaluation due to respiratory variation and motion. Portal vein and splenic vein remain patent. There is a small collection with mild mass effect posterior to the portal vein best seen on image 53/21, this measures 18 x 14 mm present on previous imaging, perhaps slightly enlarged. Spleen:  Splenic vein is patent.  Spleen normal size. Adrenals/Urinary Tract:   Adrenal glands are normal. Kidneys with smooth contours and Bosniak category II cysts for which no additional dedicated follow-up imaging is recommended. Largest 13 mm in the lower pole of the RIGHT kidney. No perinephric stranding. No hydronephrosis. Stomach/Bowel: Marked duodenal edema. Inflammation and likely fat necrosis or early fat necrosis at the root of the small bowel mesentery. Mild distension of the colon with: Cut off the splenic flexure. Colon at 5 cm currently. Vascular/Lymphatic: No pathologically enlarged lymph nodes identified. No abdominal aortic aneurysm demonstrated. Other:  Flank edema bilaterally. Musculoskeletal: No suspicious bone lesions identified. IMPRESSION: 1. Findings of acute interstitial edematous pancreatitis with peripancreatic fluid collections some with signs of hemorrhagic or proteinaceous change. Appearance of peripancreatic changes with some worsening since prior imaging suggestive peripancreatic necrosis. Pancreatic ascites and more simple appearing fluid elsewhere with slight interval increase. 2. Scattered areas of acute pancreatic fluid and or peripancreatic necrosis associated with mass effect upon the portal vein and root of the small bowel mesentery. Venous structures remain patent. Short interval follow-up may be warranted to ensure continued patency. 3. No cholelithiasis or signs of choledocholithiasis. 4. Rather striking tapered narrowing of the mid common bile duct with biliary enhancement. This may be reactive secondary to pancreatitis and is not associated currently with biliary duct dilation. Given appearance of the biliary tree would also correlate with any laboratory evidence of IGg4 related disease that could explain biliary and pancreatic abnormalities though pancreatitis could be related to potential anatomic abnormality described below. 5. Dilated main pancreatic duct is mildly irregular and is only mildly dilated. Suspect small Santorinicele at the minor  papilla. Findings may reflect either pancreatic divisum or dominant dorsal drainage via minor papilla which can be a risk factor for pancreatitis. In general assessment is limited by respiratory and patient related motion. 6. Marked duodenal edema if there are continued symptoms which do not follow the trend of expected evolution for pancreatitis and or pancreatic enzymes could consider the possibility of either severe secondary or primary duodenitis. 7. Inflammation of  affecting the splenic flexure of the colon with upstream dilation is a common site of functional colonic narrowing or obstruction in the setting of pancreatitis. Suggest attention on follow-up. 8. Small bilateral pleural effusions and basilar atelectasis. 9. Motion limited assessment of hepatic parenchyma without gross signs of focal, suspicious hepatic abnormality. Electronically Signed   By: Donzetta Kohut M.D.   On: 09/19/2022 14:19      Impression / Plan:   Molly Brown is a 72 y.o. female with obesity, A-fib on Eliquis, chronic GERD, hypertension, hypothyroidism is admitted with acute pancreatitis of unclear etiology, now with acute necrotizing pancreatitis without any evidence of infection  Acute necrotizing pancreatitis of unclear etiology No evidence of cholelithiasis or choledocholithiasis Serum triglycerides normal IgG4 levels are pending No history of alcohol use There is no evidence of infection of the necrotic pancreas Patient is not tolerating even liquid diet, therefore recommend postpyloric enteral feeds She also developed peripancreatic fluid collections, which can involve into walled off necrosis or pseudocyst of pancreas Patient will need repeat imaging in 4 to 6 weeks Patient might develop symptoms due to mass effect from peripancreatic fluid collections, therefore need close monitoring and close follow-up as outpatient She might need pancreatic enzyme replacement  Isolated hyperbilirubinemia: Predominantly  indirect MRCP did not reveal choledocholithiasis, there is no evidence of biliary dilatation or obstruction No indication for ERCP at this time  History of A-fib Continue heparin drip Monitor CBC closely for signs of hemorrhage  Thank you for involving me in the care of this patient.      LOS: 4 days   Lannette Donath, MD  09/19/2022, 10:41 PM    Note: This dictation was prepared with Dragon dictation along with smaller phrase technology. Any transcriptional errors that result from this process are unintentional.

## 2022-09-20 ENCOUNTER — Inpatient Hospital Stay: Payer: BC Managed Care – PPO

## 2022-09-20 DIAGNOSIS — K8689 Other specified diseases of pancreas: Secondary | ICD-10-CM | POA: Diagnosis not present

## 2022-09-20 DIAGNOSIS — E876 Hypokalemia: Secondary | ICD-10-CM | POA: Clinically undetermined

## 2022-09-20 DIAGNOSIS — G9341 Metabolic encephalopathy: Secondary | ICD-10-CM | POA: Diagnosis not present

## 2022-09-20 DIAGNOSIS — K859 Acute pancreatitis without necrosis or infection, unspecified: Secondary | ICD-10-CM | POA: Diagnosis not present

## 2022-09-20 DIAGNOSIS — K8591 Acute pancreatitis with uninfected necrosis, unspecified: Secondary | ICD-10-CM

## 2022-09-20 DIAGNOSIS — I4891 Unspecified atrial fibrillation: Secondary | ICD-10-CM | POA: Diagnosis not present

## 2022-09-20 LAB — COMPREHENSIVE METABOLIC PANEL
ALT: 23 U/L (ref 0–44)
AST: 32 U/L (ref 15–41)
Albumin: 2.6 g/dL — ABNORMAL LOW (ref 3.5–5.0)
Alkaline Phosphatase: 57 U/L (ref 38–126)
Anion gap: 9 (ref 5–15)
BUN: 13 mg/dL (ref 8–23)
CO2: 26 mmol/L (ref 22–32)
Calcium: 8.5 mg/dL — ABNORMAL LOW (ref 8.9–10.3)
Chloride: 97 mmol/L — ABNORMAL LOW (ref 98–111)
Creatinine, Ser: 0.79 mg/dL (ref 0.44–1.00)
GFR, Estimated: >60 mL/min (ref >60)
Glucose, Bld: 92 mg/dL (ref 70–99)
Potassium: 3.1 mmol/L — ABNORMAL LOW (ref 3.5–5.1)
Sodium: 132 mmol/L — ABNORMAL LOW (ref 135–145)
Total Bilirubin: 3.7 mg/dL — ABNORMAL HIGH (ref 0.3–1.2)
Total Protein: 6.3 g/dL — ABNORMAL LOW (ref 6.5–8.1)

## 2022-09-20 LAB — HEPARIN LEVEL (UNFRACTIONATED)
Heparin Unfractionated: 0.62 IU/mL (ref 0.30–0.70)
Heparin Unfractionated: 0.53 IU/mL (ref 0.30–0.70)

## 2022-09-20 LAB — CULTURE, BLOOD (ROUTINE X 2)
Culture: NO GROWTH
Culture: NO GROWTH
Special Requests: ADEQUATE

## 2022-09-20 LAB — GLUCOSE, CAPILLARY
Glucose-Capillary: 111 mg/dL — ABNORMAL HIGH (ref 70–99)
Glucose-Capillary: 128 mg/dL — ABNORMAL HIGH (ref 70–99)

## 2022-09-20 LAB — CBC
HCT: 35.4 % — ABNORMAL LOW (ref 36.0–46.0)
Hemoglobin: 11.7 g/dL — ABNORMAL LOW (ref 12.0–15.0)
MCH: 28.5 pg (ref 26.0–34.0)
MCHC: 33.1 g/dL (ref 30.0–36.0)
MCV: 86.1 fL (ref 80.0–100.0)
Platelets: 259 10*3/uL (ref 150–400)
RBC: 4.11 MIL/uL (ref 3.87–5.11)
RDW: 13.8 % (ref 11.5–15.5)
WBC: 11.1 10*3/uL — ABNORMAL HIGH (ref 4.0–10.5)
nRBC: 0 % (ref 0.0–0.2)

## 2022-09-20 LAB — IGG 4: IgG, Subclass 4: 18 mg/dL (ref 2–96)

## 2022-09-20 LAB — APTT
aPTT: 80 seconds — ABNORMAL HIGH (ref 24–36)
aPTT: 77 seconds — ABNORMAL HIGH (ref 24–36)

## 2022-09-20 LAB — LIPASE, BLOOD: Lipase: 28 U/L (ref 11–51)

## 2022-09-20 MED ORDER — POTASSIUM CHLORIDE 10 MEQ/100ML IV SOLN
10.0000 meq | INTRAVENOUS | Status: AC
  Administered 2022-09-20 (×2): 10 meq via INTRAVENOUS
  Filled 2022-09-20 (×2): qty 100

## 2022-09-20 MED ORDER — BISACODYL 5 MG PO TBEC
5.0000 mg | DELAYED_RELEASE_TABLET | Freq: Once | ORAL | Status: AC
  Administered 2022-09-20: 5 mg via ORAL
  Filled 2022-09-20: qty 1

## 2022-09-20 MED ORDER — POLYETHYLENE GLYCOL 3350 17 G PO PACK
34.0000 g | PACK | Freq: Once | ORAL | Status: AC
  Administered 2022-09-21: 34 g via ORAL
  Filled 2022-09-20: qty 2

## 2022-09-20 MED ORDER — PROSOURCE TF20 ENFIT COMPATIBL EN LIQD
60.0000 mL | Freq: Every day | ENTERAL | Status: DC
  Administered 2022-09-21 – 2022-09-23 (×3): 60 mL
  Filled 2022-09-20 (×3): qty 60

## 2022-09-20 MED ORDER — VITAL 1.5 CAL PO LIQD
1000.0000 mL | ORAL | Status: DC
  Administered 2022-09-20 – 2022-09-22 (×4): 1000 mL

## 2022-09-20 MED ORDER — FREE WATER
170.0000 mL | Status: DC
  Administered 2022-09-20 – 2022-09-23 (×18): 170 mL

## 2022-09-20 MED ORDER — OSMOLITE 1.2 CAL PO LIQD: 1000.0000 mL | ORAL | Status: DC

## 2022-09-20 MED ORDER — HEPARIN BOLUS VIA INFUSION
2600.0000 [IU] | Freq: Once | INTRAVENOUS | Status: AC
  Administered 2022-09-20: 2600 [IU] via INTRAVENOUS
  Filled 2022-09-20: qty 2600

## 2022-09-20 NOTE — Consult Note (Signed)
ANTICOAGULATION CONSULT NOTE  Pharmacy Consult for IV Heparin Indication: atrial fibrillation  Patient Measurements: Height: 5\' 8"  (172.7 cm) Weight: 99.8 kg (220 lb) IBW/kg (Calculated) : 63.9 Heparin Dosing Weight: 85.8 kg  Labs: Recent Labs    09/18/22 0525 09/19/22 0319 09/19/22 0816 09/19/22 1615 09/19/22 2251 09/20/22 0502 09/20/22 1228  HGB 11.1* 11.1*  --   --   --  11.7*  --   HCT 33.8* 33.1*  --   --   --  35.4*  --   PLT 209 207  --   --   --  259  --   APTT  --   --  52*   < > 51* 80* 77*  HEPARINUNFRC  --   --  0.71*  --   --  0.62 0.53  CREATININE 0.74 0.65  --   --   --  0.79  --    < > = values in this interval not displayed.     Estimated Creatinine Clearance: 82 mL/min (by C-G formula based on SCr of 0.79 mg/dL).   Medical History: Past Medical History:  Diagnosis Date   AF (atrial fibrillation) (HCC)    Anal fissure    Anxiety    Cardiac arrhythmia due to congenital heart disease    Chicken pox    Depression    GERD (gastroesophageal reflux disease)    Heart murmur     Medications:  Apixaban 5 mg BID (last dose 09/16/22 at 2251)  Assessment: 72 y/o F with medical history including Afib on apixaban who is admitted with acute pancreatitis. General surgery consulted. Pharmacy consulted to initiate and manage heparin infusion while apixaban is on hold pending surgery evaluation. Heparin was stopped early morning 1/21 @ 0300 for surgery but patient then became not amenable to procedure, pulled out her IV, and wanted to go home. She was reoriented and agreed to surgery, which was subsequently postponed. In order to allow time for new IV placement, per discussion with MD, we opted to give a one-time dose of enoxaparin 1 mg/kg to provide anticoagulation until IV access could be established again for heparin. IV access established again 1/21 @ ~1530.  1/20 1734 aPTT 79 before heparin started. No baseline heparin level ordered prior to giving dose of  enoxaparin.  1/22 0816 aPTT 52 HL 0.71 1/22 1615 aPTT 58 1/22 2251 aPTT 51, subtherapeutic 1/23 0502 aPTT 80 therapeutic x 1, HL 0.62 1/23 1228 aPTT 77 HL 0.53  Goal of Therapy:  Heparin level 0.3-0.7 units/ml once aPTT and heparin level correlate.  aPTT 66 - 102 seconds Monitor platelets by anticoagulation protocol: Yes  Plan: aPTT and heparin level are correlating and are therapeutic. Will continue heparin infusion at 1800 units/hr. Recheck aPTT/HL/CBC with AM labs. Probably can transition to only heparin level tomorrow morning.   Eleonore Chiquito, PharmD 09/20/2022 1:12 PM

## 2022-09-20 NOTE — Consult Note (Signed)
ANTICOAGULATION CONSULT NOTE  Pharmacy Consult for IV Heparin Indication: atrial fibrillation  Patient Measurements: Height: 5\' 8"  (172.7 cm) Weight: 99.8 kg (220 lb) IBW/kg (Calculated) : 63.9 Heparin Dosing Weight: 85.8 kg  Labs: Recent Labs    09/17/22 0452 09/17/22 1734 09/18/22 0525 09/19/22 0319 09/19/22 0816 09/19/22 1615 09/19/22 2251  HGB 13.1  --  11.1* 11.1*  --   --   --   HCT 39.5  --  33.8* 33.1*  --   --   --   PLT 190  --  209 207  --   --   --   APTT  --    < >  --   --  52* 58* 51*  HEPARINUNFRC  --   --   --   --  0.71*  --   --   CREATININE 0.77  --  0.74 0.65  --   --   --    < > = values in this interval not displayed.     Estimated Creatinine Clearance: 82 mL/min (by C-G formula based on SCr of 0.65 mg/dL).   Medical History: Past Medical History:  Diagnosis Date   AF (atrial fibrillation) (HCC)    Anal fissure    Anxiety    Cardiac arrhythmia due to congenital heart disease    Chicken pox    Depression    GERD (gastroesophageal reflux disease)    Heart murmur     Medications:  Apixaban 5 mg BID (last dose 09/16/22 at 2251)  Assessment: 72 y/o F with medical history including Afib on apixaban who is admitted with acute pancreatitis. General surgery consulted. Pharmacy consulted to initiate and manage heparin infusion while apixaban is on hold pending surgery evaluation. Heparin was stopped early morning 1/21 @ 0300 for surgery but patient then became not amenable to procedure, pulled out her IV, and wanted to go home. She was reoriented and agreed to surgery, which was subsequently postponed. In order to allow time for new IV placement, per discussion with MD, we opted to give a one-time dose of enoxaparin 1 mg/kg to provide anticoagulation until IV access could be established again for heparin. IV access established again 1/21 @ ~1530.  1/20 1734 aPTT 79 before heparin started. No baseline heparin level ordered prior to giving dose of  enoxaparin.  1/22 0816 aPTT 52 HL 0.71 1/22 1615 aPTT 58 1/22 2251 aPTT 51, subtherapeutic  Goal of Therapy:  Heparin level 0.3-0.7 units/ml once aPTT and heparin level correlate.  aPTT 66 - 102 seconds Monitor platelets by anticoagulation protocol: Yes  Plan: -aPTT is subtherapeutic.  -Will give heparin bolus of 2600 units x 1 -Increase heparin infusion to 1800 units/hr.  -Recheck aPTT in 6 hours.  -CBC and heparin level with AM labs. Switch to heparin level monitoring once aPTT and heparin level are correlating.   Renda Rolls, PharmD, [facility] 09/20/2022 12:15 AM

## 2022-09-20 NOTE — Assessment & Plan Note (Addendum)
K 4.5 today Monitor BMP

## 2022-09-20 NOTE — Progress Notes (Signed)
[facility]  Patient ID: Annjeanette Sarwar MRN: [id number] DOB/AGE: Sep 15, 1950 72 y.o.   Admit date: 09/15/2022 Referring Physician Dr. Francine Graven  Primary Physician  Primary Cardiologist Dr. Nehemiah Massed Reason for Consultation rapid atrial fibrillation  HPI: Mitsuko Luera is a 71YOF with a PMH of HOCM (EF >55% 12/2021), chronic AF on eliquis, OSA, HLD, who presented to [facility] ED 09/15/2022 with sudden onset abdominal pain x 1 day.  She was in AF RVR on presentation, CT abd/pelv concerning for acute interstitial pancreatitis, MRCP without choledocholithiasis, severe necrotizing pancreatitis, no surgical intervention planned.  Cardiology is following the floor atrial fibrillation.  Interval History: -Sister and husband at bedside, Dobbhoff tube placed today -Abdominal pain has improved, off supplemental oxygen, denies chest pain or shortness of breath -Remains on heparin infusion -In atrial fibrillation on telemetry, rate primarily controlled in the 90s-low 100s, some paroxysms this morning from 110s-130s  Vitals:   09/19/22 1942 09/19/22 2353 09/20/22 0457 09/20/22 0801  BP: 128/70 107/64 126/73 118/66  Pulse: 96 70 100 95  Resp: 17 17 17 18   Temp: 98.4 F (36.9 C) 98.6 F (37 C) 98.9 F (37.2 C) 98.1 F (36.7 C)  TempSrc: Oral Oral Oral   SpO2: 98% 97% 96% 94%  Weight:      Height:         Intake/Output Summary (Last 24 hours) at 09/20/2022 7829 Last data filed at 09/20/2022 0021 Gross per 24 hour  Intake 180 ml  Output --  Net 180 ml     PHYSICAL EXAM General: ill-appearing Caucasian female, well nourished, in no acute distress.  Sitting upright in bed with sister and husband at bedside. HEENT:  Normocephalic and atraumatic. NGT in place Neck:   No JVD.  Lungs: Normal respiratory effort on oxygen on room air, poor inspiratory effort Heart: Irregularly irregular with controlled rate. Normal S1 and S2 .  Blowing holosystolic murmur heard throughout Abdomen: Mildly-distended  appearing.  Msk: Normal strength and tone for age. Extremities: No clubbing, cyanosis.  Trace edema.  Neuro: Alert and oriented x3, fatigued Psych: Calm and cooperative   LABS: Basic Metabolic Panel: Recent Labs    09/19/22 0319 09/20/22 0502  NA 130* 132*  K 3.7 3.1*  CL 100 97*  CO2 24 26  GLUCOSE 96 92  BUN 14 13  CREATININE 0.65 0.79  CALCIUM 8.2* 8.5*    Liver Function Tests: Recent Labs    09/19/22 0319 09/20/22 0502  AST 26 32  ALT 18 23  ALKPHOS 44 57  BILITOT 3.6* 3.7*  PROT 5.6* 6.3*  ALBUMIN 2.4* 2.6*    Recent Labs    09/19/22 0319 09/20/22 0502  LIPASE 26 28    CBC: Recent Labs    09/19/22 0319 09/20/22 0502  WBC 10.0 11.1*  HGB 11.1* 11.7*  HCT 33.1* 35.4*  MCV 87.1 86.1  PLT 207 259    Cardiac Enzymes: No results for input(s): "CKTOTAL", "CKMB", "CKMBINDEX", "TROPONINI" in the last 72 hours. BNP: Invalid input(s): "POCBNP" D-Dimer: No results for input(s): "DDIMER" in the last 72 hours. Hemoglobin A1C: No results for input(s): "HGBA1C" in the last 72 hours. Fasting Lipid Panel: No results for input(s): "CHOL", "HDL", "LDLCALC", "TRIG", "CHOLHDL", "LDLDIRECT" in the last 72 hours.  Thyroid Function Tests: No results for input(s): "TSH", "T4TOTAL", "T3FREE", "THYROIDAB" in the last 72 hours.  Invalid input(s): "FREET3" Anemia Panel: No results for input(s): "VITAMINB12", "FOLATE", "FERRITIN", "TIBC", "IRON", "RETICCTPCT" in the last 72 hours.  DG Abd 1 View  Result  Date: 09/20/2022 CLINICAL DATA:  [id number].  Check feeding tube positioning. EXAM: ABDOMEN - 1 VIEW COMPARISON:  KUB 09/15/2022, CT abdomen and pelvis 09/17/2022 FINDINGS: The bowel gas pattern is nonobstructive. No radio-opaque calculi or other significant radiographic abnormality are seen. Feeding tube extends to the left within the stomach with the tip in the proximal body of the stomach. No free air is seen. There is streaky atelectasis in the lung bases. IMPRESSION:  Feeding tube extends to the left within the stomach with the tip in the proximal body of the stomach. Electronically Signed   By: Telford Nab M.D.   On: 09/20/2022 02:09   MR 3D Recon At Scanner  Result Date: 09/19/2022 CLINICAL DATA:  MIP reconstructions of the biliary tree which were generated for the MRCP, MRI of the abdomen with and without contrast which is reported separately. EXAM: 3-DIMENSIONAL MR IMAGE RENDERING ON ACQUISITION WORKSTATION TECHNIQUE: 3-dimensional MR images were rendered by post-processing of the original MR data on an acquisition workstation. The 3-dimensional MR images were interpreted and findings were reported in the accompanying complete MR report for this study COMPARISON:  MRCP reported separately. FINDINGS: MIP reconstructions in 3D were provided for interpretation of MRCP/MRI of the abdomen with and without contrast. The report is found under the a session number [id number] CHL. IMPRESSION: Please refer to report for MRI abdomen/MRCP with a session number outlined above. Electronically Signed   By: Zetta Bills M.D.   On: 09/19/2022 14:47   MR ABDOMEN MRCP W WO CONTAST  Result Date: 09/19/2022 CLINICAL DATA:  Acute, severe pancreatitis. EXAM: MRI ABDOMEN WITHOUT AND WITH CONTRAST (INCLUDING MRCP) TECHNIQUE: Multiplanar multisequence MR imaging of the abdomen was performed both before and after the administration of intravenous contrast. Heavily T2-weighted images of the biliary and pancreatic ducts were obtained, and three-dimensional MRCP images were rendered by post processing. CONTRAST:  47mL GADAVIST GADOBUTROL 1 MMOL/ML IV SOLN COMPARISON:  September 15, 2022 and CT imaging from September 17, 2022 FINDINGS: Lower chest: Small bilateral pleural effusions. Basilar atelectasis. Cardiomegaly, lung bases not well assessed. Bilateral breast implants in place. Hepatobiliary: Motion limited assessment of hepatic parenchyma without gross signs of focal, suspicious hepatic  abnormality. Gallbladder mild-to-moderately distended with signs of pericholecystic fluid which is mildly increased compared to previous CT imaging from September 17, 2022 and is associated with perihepatic fluid and inflammation in the anterior pararenal space. No signs of biliary duct dilation. No visible cholelithiasis. Attenuation of the distal common bile duct is smooth and long segment in the intrapancreatic portion of the common bile duct. Dilated main pancreatic duct is mildly irregular and is only mildly dilated. Suspect small Santorinicele at the minor papilla (image 33/15) configuration of ductal structures suggests either pancreatic divisum or dominant dorsal drainage via minor papilla which can be a risk factor for pancreatitis. Cystic duct inserts upon the midportion of the extrahepatic biliary tree just above the pancreatic portion of the common bile duct. Pancreas: Edema within the pancreas. Intrinsic T1 signal in the pancreas is largely preserved as is enhancement. Marked peripancreatic stranding. 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 Peripancreatic fluid collections some with signs of hemorrhagic or proteinaceous change, for instance in the lesser sac and area measuring 2.3 x 2.0 cm (image 48/19) fluid in the abdomen predominantly low on T1 and high on T2 with edema and hemorrhagic/proteinaceous material tracking throughout the root of the small bowel mesentery. Assessment of upper abdominal structures with markedly limited evaluation due to respiratory variation and motion. Portal vein and  splenic vein remain patent. There is a small collection with mild mass effect posterior to the portal vein best seen on image 53/21, this measures 18 x 14 mm present on previous imaging, perhaps slightly enlarged. Spleen:  Splenic vein is patent.  Spleen normal size. Adrenals/Urinary Tract:  Adrenal glands are normal. Kidneys with smooth contours and Bosniak category II cysts for which no additional  dedicated follow-up imaging is recommended. Largest 13 mm in the lower pole of the RIGHT kidney. No perinephric stranding. No hydronephrosis. Stomach/Bowel: Marked duodenal edema. Inflammation and likely fat necrosis or early fat necrosis at the root of the small bowel mesentery. Mild distension of the colon with: Cut off the splenic flexure. Colon at 5 cm currently. Vascular/Lymphatic: No pathologically enlarged lymph nodes identified. No abdominal aortic aneurysm demonstrated. Other:  Flank edema bilaterally. Musculoskeletal: No suspicious bone lesions identified. IMPRESSION: 1. Findings of acute interstitial edematous pancreatitis with peripancreatic fluid collections some with signs of hemorrhagic or proteinaceous change. Appearance of peripancreatic changes with some worsening since prior imaging suggestive peripancreatic necrosis. Pancreatic ascites and more simple appearing fluid elsewhere with slight interval increase. 2. Scattered areas of acute pancreatic fluid and or peripancreatic necrosis associated with mass effect upon the portal vein and root of the small bowel mesentery. Venous structures remain patent. Short interval follow-up may be warranted to ensure continued patency. 3. No cholelithiasis or signs of choledocholithiasis. 4. Rather striking tapered narrowing of the mid common bile duct with biliary enhancement. This may be reactive secondary to pancreatitis and is not associated currently with biliary duct dilation. Given appearance of the biliary tree would also correlate with any laboratory evidence of IGg4 related disease that could explain biliary and pancreatic abnormalities though pancreatitis could be related to potential anatomic abnormality described below. 5. Dilated main pancreatic duct is mildly irregular and is only mildly dilated. Suspect small Santorinicele at the minor papilla. Findings may reflect either pancreatic divisum or dominant dorsal drainage via minor papilla which can  be a risk factor for pancreatitis. In general assessment is limited by respiratory and patient related motion. 6. Marked duodenal edema if there are continued symptoms which do not follow the trend of expected evolution for pancreatitis and or pancreatic enzymes could consider the possibility of either severe secondary or primary duodenitis. 7. Inflammation of affecting the splenic flexure of the colon with upstream dilation is a common site of functional colonic narrowing or obstruction in the setting of pancreatitis. Suggest attention on follow-up. 8. Small bilateral pleural effusions and basilar atelectasis. 9. Motion limited assessment of hepatic parenchyma without gross signs of focal, suspicious hepatic abnormality. Electronically Signed   By: Donzetta Kohut M.D.   On: 09/19/2022 14:19     Echo EF>55%, asymmetric septal hypertrophy consistent with hypertrophic obstructive cardiomyopathy, moderate mitral regurgitation  TELEMETRY: Atrial fibrillation 90s - low  100s:  ASSESSMENT AND PLAN:  Principal Problem:   Severe acute pancreatitis Active Problems:   GERD (gastroesophageal reflux disease)   Hypothyroidism   Atrial fibrillation with RVR (HCC)   HOCM (hypertrophic obstructive cardiomyopathy) (HCC)   Obesity (BMI 30-39.9)   Acute metabolic encephalopathy   Sterile pancreatic necrosis   Acute pancreatitis    1. Chronic atrial fibrillation, mildly elevated heart rate in the setting of pancreatitis, on Eliquis for stroke prevention, switched to heparin infusion preparation for possible cholecystectomy, s/p diltiazem infusion, now on Cardizem CD 180 mg daily and metoprolol tartrate 50 mg twice daily for rate control. 2.  Acute necrotizing pancreatitis, currently managed  conservatively, on IV zosyn, Dobbhoff tube placed 1/23 for enteral feeds 3.  Hypertrophic obstructive cardiomyopathy, asymmetric septal hypertrophy (SWT 2.0 cm ) observed on 2D echocardiogram 12/30/2021.  Euvolemic on room air  1/23   Recommendations   1.  Agree with current therapy 2.  Continue heparin for now, resume Eliquis once ok from a GI/surgical perspective 3.  Continue Cardizem CD and metoprolol tartrate for rate control 4.  S/p IV Lasix 40 mg x 1 on 1/22, hold off repeat dosing for now 5.  Follow-up with Dr. Juliann Pares after discharge  This patient's plan of care was discussed and created with Dr. Darrold Junker and he is in agreement.    Rebeca Allegra, PA-C 09/20/2022 8:24 AM

## 2022-09-20 NOTE — Progress Notes (Signed)
Dobhoff tube withdrawn 15 cm then adavanced again to 75cm and re-secured, will check abd xray one hour.  Dr Leslye Peer

## 2022-09-20 NOTE — Progress Notes (Signed)
Molly Repress, MD [address]  [address]  [place], [place] [address]  Main: [phone]  Fax: [phone] Pager: [phone]   Subjective: Patient reports that her abdominal pain is improving.  She is passing gas, feels distended and hard.  She is started on postpyloric tube feeds.  On clear liquid diet only.  Denies any nausea or vomiting, fever, chills.  Patient's daughter is bedside   Objective: Vital signs in last 24 hours: Vitals:   09/20/22 0900 09/20/22 1158 09/20/22 1715 09/20/22 2020  BP:  124/74 123/67 111/75  Pulse:  96 96 (!) 113  Resp: 15 16 16 16   Temp:  98 F (36.7 C) 98 F (36.7 C) 98.6 F (37 C)  TempSrc:    Oral  SpO2:  94% 93% 93%  Weight:      Height:       Weight change:   Intake/Output Summary (Last 24 hours) at 09/20/2022 2351 Last data filed at 09/20/2022 1851 Gross per 24 hour  Intake 1468.12 ml  Output 0 ml  Net 1468.12 ml     Exam: Heart:: Regular rate and rhythm Lungs: normal Abdomen: Distended, dull, nontender, hypoactive bowel sounds   Lab Results:    Latest Ref Rng & Units 09/20/2022    5:02 AM 09/19/2022    3:19 AM 09/18/2022    5:25 AM  CBC  WBC 4.0 - 10.5 K/uL 11.1  10.0  12.0   Hemoglobin 12.0 - 15.0 g/dL 09/20/2022  96.2  95.2   Hematocrit 36.0 - 46.0 % 35.4  33.1  33.8   Platelets 150 - 400 K/uL 259  207  209       Latest Ref Rng & Units 09/20/2022    5:02 AM 09/19/2022    3:19 AM 09/18/2022    5:25 AM  CMP  Glucose 70 - 99 mg/dL 92  96  98   BUN 8 - 23 mg/dL 13  14  17    Creatinine 0.44 - 1.00 mg/dL 09/20/2022   3.24   Sodium 135 - 145 mmol/L 132  130  131   Potassium 3.5 - 5.1 mmol/L 3.1  3.7  3.8   Chloride 98 - 111 mmol/L 97  100  100   CO2 22 - 32 mmol/L 26  24  23    Calcium 8.9 - 10.3 mg/dL 8.5  8.2  8.3   Total Protein 6.5 - 8.1 g/dL 6.3  5.6  6.1   Total Bilirubin 0.3 - 1.2 mg/dL 3.7  3.6  2.8   Alkaline Phos 38 - 126 U/L 57  44  46   AST 15 - 41 U/L 32  26  19   ALT 0 - 44 U/L 23  18  14       Micro Results: Recent Results (from the past 240 hour(s))  Blood culture (routine x 2)     Status: None   Collection Time: 09/15/22  6:09 AM   Specimen: BLOOD  Result Value Ref Range Status   Specimen Description BLOOD  RIGHT AC  Final   Special Requests   Final    BOTTLES DRAWN AEROBIC AND ANAEROBIC Blood Culture results may not be optimal due to an excessive volume of blood received in culture bottles   Culture   Final    NO GROWTH 5 DAYS Performed at [facility], [address], [place], 09/17/22 [facility]    Report Status 09/20/2022 FINAL  Final  Blood culture (routine x 2)  Status: None   Collection Time: 09/15/22  6:09 AM   Specimen: BLOOD  Result Value Ref Range Status   Specimen Description BLOOD  LEFT [facility]  Final   Special Requests   Final    BOTTLES DRAWN AEROBIC AND ANAEROBIC Blood Culture adequate volume   Culture   Final    NO GROWTH 5 DAYS Performed at [facility], [address] [place], [place] [address]    Report Status 09/20/2022 FINAL  Final   Studies/Results: DG Abd 1 View  Result Date: 09/20/2022 CLINICAL DATA:  Feeding tube placement EXAM: ABDOMEN - 1 VIEW COMPARISON:  09/20/2022 8:56 a.m. FINDINGS: The feeding tube tip projects over the duodenal bulb. Gas visualized in the transverse colon. Mild subsegmental atelectasis suspected at the lung bases. IMPRESSION: 1. Feeding tube tip projects over the duodenal bulb. Electronically Signed   By: Gaylyn Rong M.D.   On: 09/20/2022 12:07   DG Abd 1 View  Result Date: 09/20/2022 CLINICAL DATA:  Feeding tube placement EXAM: ABDOMEN - 1 VIEW COMPARISON:  09/19/2022 FINDINGS: Feeding tube within the proximal gastric body. No change from prior. IMPRESSION: No change in feeding tube position.  Stylet removed Electronically Signed   By: Genevive Bi M.D.   On: 09/20/2022 09:28   DG Abd 1 View  Result Date: 09/20/2022 CLINICAL DATA:  [id number].  Check feeding tube positioning. EXAM:  ABDOMEN - 1 VIEW COMPARISON:  KUB 09/15/2022, CT abdomen and pelvis 09/17/2022 FINDINGS: The bowel gas pattern is nonobstructive. No radio-opaque calculi or other significant radiographic abnormality are seen. Feeding tube extends to the left within the stomach with the tip in the proximal body of the stomach. No free air is seen. There is streaky atelectasis in the lung bases. IMPRESSION: Feeding tube extends to the left within the stomach with the tip in the proximal body of the stomach. Electronically Signed   By: Almira Bar M.D.   On: 09/20/2022 02:09   MR 3D Recon At Scanner  Result Date: 09/19/2022 CLINICAL DATA:  MIP reconstructions of the biliary tree which were generated for the MRCP, MRI of the abdomen with and without contrast which is reported separately. EXAM: 3-DIMENSIONAL MR IMAGE RENDERING ON ACQUISITION WORKSTATION TECHNIQUE: 3-dimensional MR images were rendered by post-processing of the original MR data on an acquisition workstation. The 3-dimensional MR images were interpreted and findings were reported in the accompanying complete MR report for this study COMPARISON:  MRCP reported separately. FINDINGS: MIP reconstructions in 3D were provided for interpretation of MRCP/MRI of the abdomen with and without contrast. The report is found under the a session number [id number] CHL. IMPRESSION: Please refer to report for MRI abdomen/MRCP with a session number outlined above. Electronically Signed   By: Donzetta Kohut M.D.   On: 09/19/2022 14:47   MR ABDOMEN MRCP W WO CONTAST  Result Date: 09/19/2022 CLINICAL DATA:  Acute, severe pancreatitis. EXAM: MRI ABDOMEN WITHOUT AND WITH CONTRAST (INCLUDING MRCP) TECHNIQUE: Multiplanar multisequence MR imaging of the abdomen was performed both before and after the administration of intravenous contrast. Heavily T2-weighted images of the biliary and pancreatic ducts were obtained, and three-dimensional MRCP images were rendered by post processing.  CONTRAST:  65mL GADAVIST GADOBUTROL 1 MMOL/ML IV SOLN COMPARISON:  September 15, 2022 and CT imaging from September 17, 2022 FINDINGS: Lower chest: Small bilateral pleural effusions. Basilar atelectasis. Cardiomegaly, lung bases not well assessed. Bilateral breast implants in place. Hepatobiliary: Motion limited assessment of hepatic parenchyma without gross signs of focal, suspicious hepatic  abnormality. Gallbladder mild-to-moderately distended with signs of pericholecystic fluid which is mildly increased compared to previous CT imaging from September 17, 2022 and is associated with perihepatic fluid and inflammation in the anterior pararenal space. No signs of biliary duct dilation. No visible cholelithiasis. Attenuation of the distal common bile duct is smooth and long segment in the intrapancreatic portion of the common bile duct. Dilated main pancreatic duct is mildly irregular and is only mildly dilated. Suspect small Santorinicele at the minor papilla (image 33/15) configuration of ductal structures suggests either pancreatic divisum or dominant dorsal drainage via minor papilla which can be a risk factor for pancreatitis. Cystic duct inserts upon the midportion of the extrahepatic biliary tree just above the pancreatic portion of the common bile duct. Pancreas: Edema within the pancreas. Intrinsic T1 signal in the pancreas is largely preserved as is enhancement. Marked peripancreatic stranding. 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 Peripancreatic fluid collections some with signs of hemorrhagic or proteinaceous change, for instance in the lesser sac and area measuring 2.3 x 2.0 cm (image 48/19) fluid in the abdomen predominantly low on T1 and high on T2 with edema and hemorrhagic/proteinaceous material tracking throughout the root of the small bowel mesentery. Assessment of upper abdominal structures with markedly limited evaluation due to respiratory variation and motion. Portal vein and splenic vein  remain patent. There is a small collection with mild mass effect posterior to the portal vein best seen on image 53/21, this measures 18 x 14 mm present on previous imaging, perhaps slightly enlarged. Spleen:  Splenic vein is patent.  Spleen normal size. Adrenals/Urinary Tract:  Adrenal glands are normal. Kidneys with smooth contours and Bosniak category II cysts for which no additional dedicated follow-up imaging is recommended. Largest 13 mm in the lower pole of the RIGHT kidney. No perinephric stranding. No hydronephrosis. Stomach/Bowel: Marked duodenal edema. Inflammation and likely fat necrosis or early fat necrosis at the root of the small bowel mesentery. Mild distension of the colon with: Cut off the splenic flexure. Colon at 5 cm currently. Vascular/Lymphatic: No pathologically enlarged lymph nodes identified. No abdominal aortic aneurysm demonstrated. Other:  Flank edema bilaterally. Musculoskeletal: No suspicious bone lesions identified. IMPRESSION: 1. Findings of acute interstitial edematous pancreatitis with peripancreatic fluid collections some with signs of hemorrhagic or proteinaceous change. Appearance of peripancreatic changes with some worsening since prior imaging suggestive peripancreatic necrosis. Pancreatic ascites and more simple appearing fluid elsewhere with slight interval increase. 2. Scattered areas of acute pancreatic fluid and or peripancreatic necrosis associated with mass effect upon the portal vein and root of the small bowel mesentery. Venous structures remain patent. Short interval follow-up may be warranted to ensure continued patency. 3. No cholelithiasis or signs of choledocholithiasis. 4. Rather striking tapered narrowing of the mid common bile duct with biliary enhancement. This may be reactive secondary to pancreatitis and is not associated currently with biliary duct dilation. Given appearance of the biliary tree would also correlate with any laboratory evidence of IGg4  related disease that could explain biliary and pancreatic abnormalities though pancreatitis could be related to potential anatomic abnormality described below. 5. Dilated main pancreatic duct is mildly irregular and is only mildly dilated. Suspect small Santorinicele at the minor papilla. Findings may reflect either pancreatic divisum or dominant dorsal drainage via minor papilla which can be a risk factor for pancreatitis. In general assessment is limited by respiratory and patient related motion. 6. Marked duodenal edema if there are continued symptoms which do not follow the trend of  expected evolution for pancreatitis and or pancreatic enzymes could consider the possibility of either severe secondary or primary duodenitis. 7. Inflammation of affecting the splenic flexure of the colon with upstream dilation is a common site of functional colonic narrowing or obstruction in the setting of pancreatitis. Suggest attention on follow-up. 8. Small bilateral pleural effusions and basilar atelectasis. 9. Motion limited assessment of hepatic parenchyma without gross signs of focal, suspicious hepatic abnormality. Electronically Signed   By: Donzetta Kohut M.D.   On: 09/19/2022 14:19   Medications: I have reviewed the patient's current medications. Prior to Admission:  Medications Prior to Admission  Medication Sig Dispense Refill Last Dose   acetaminophen (TYLENOL) 325 MG tablet Take 650 mg by mouth every 6 (six) hours as needed.   prn   apixaban (ELIQUIS) 5 MG TABS tablet Take 1 tablet (5 mg total) by mouth 2 (two) times daily. 60 tablet 2 09/14/2022 at 0800   cyanocobalamin 500 MCG tablet Take 500 mcg by mouth daily.   09/14/2022   levothyroxine (SYNTHROID) 25 MCG tablet Take 1 tablet (25 mcg total) by mouth daily at 6 (six) AM. 30 tablet 0 09/14/2022   metoprolol tartrate (LOPRESSOR) 50 MG tablet Take 1 tablet (50 mg total) by mouth 2 (two) times daily. 60 tablet 1 09/14/2022   Multiple Vitamin (MULTIVITAMIN)  LIQD Take 5 mLs by mouth daily.   09/14/2022   omeprazole (PRILOSEC) 40 MG capsule TAKE 1 CAPSULE (40 MG TOTAL) BY MOUTH DAILY. 90 capsule 3 09/14/2022   polyethylene glycol (MIRALAX / GLYCOLAX) packet Take 17 g by mouth as needed.   prn   tacrolimus (PROTOPIC) 0.1 % ointment APPLY TO AFFECTED AREA(S) TWICE A DAY AS DIRECTED   prn   dexamethasone (DECADRON) 6 MG tablet Take 1 tablet (6 mg total) by mouth daily. (Patient not taking: Reported on 09/15/2022) 3 tablet 0 Not Taking   Probiotic Product (PROBIOTIC DAILY) CAPS Take by mouth. (Patient not taking: Reported on 09/15/2022)   Not Taking   Scheduled:  Chlorhexidine Gluconate Cloth  6 each Topical Q0600   cyanocobalamin  500 mcg Oral Daily   diltiazem  180 mg Oral Daily   [START ON 09/21/2022] feeding supplement (PROSource TF20)  60 mL Per Tube Daily   free water  170 mL Per Tube Q4H   levothyroxine  25 mcg Oral Q0600   metoprolol tartrate  50 mg Oral BID   multivitamin with minerals  1 tablet Oral Daily   pantoprazole (PROTONIX) IV  40 mg Intravenous Q12H   [START ON 09/21/2022] polyethylene glycol  34 g Oral Once   Continuous:  feeding supplement (VITAL 1.5 CAL) 30 mL/hr at 09/20/22 1829   heparin 1,800 Units/hr (09/20/22 1828)   piperacillin-tazobactam (ZOSYN)  IV 3.375 g (09/20/22 1723)   RUE:[id number], ondansetron **OR** ondansetron (ZOFRAN) IV, oxyCODONE Anti-infectives (From admission, onward)    Start     Dose/Rate Route Frequency Ordered Stop   09/16/22 1415  piperacillin-tazobactam (ZOSYN) IVPB 3.375 g        3.375 g 12.5 mL/hr over 240 Minutes Intravenous Every 8 hours 09/16/22 1410 09/26/22 1659      Scheduled Meds:  Chlorhexidine Gluconate Cloth  6 each Topical Q0600   cyanocobalamin  500 mcg Oral Daily   diltiazem  180 mg Oral Daily   [START ON 09/21/2022] feeding supplement (PROSource TF20)  60 mL Per Tube Daily   free water  170 mL Per Tube Q4H   levothyroxine  25 mcg Oral Q0600  metoprolol tartrate  50 mg  Oral BID   multivitamin with minerals  1 tablet Oral Daily   pantoprazole (PROTONIX) IV  40 mg Intravenous Q12H   [START ON 09/21/2022] polyethylene glycol  34 g Oral Once   Continuous Infusions:  feeding supplement (VITAL 1.5 CAL) 30 mL/hr at 09/20/22 1829   heparin 1,800 Units/hr (09/20/22 1828)   piperacillin-tazobactam (ZOSYN)  IV 3.375 g (09/20/22 1723)   PRN Meds:.acetaminophen, ondansetron **OR** ondansetron (ZOFRAN) IV, oxyCODONE   Assessment: Principal Problem:   Severe acute pancreatitis Active Problems:   GERD (gastroesophageal reflux disease)   Hypothyroidism   Atrial fibrillation with RVR (HCC)   HOCM (hypertrophic obstructive cardiomyopathy) (HCC)   Obesity (BMI 30-39.9)   Acute metabolic encephalopathy   Sterile pancreatic necrosis   Acute pancreatitis   Hypokalemia  Molly Brown is a 72 y.o. female with obesity, A-fib on Eliquis, chronic GERD, hypertension, hypothyroidism is admitted with acute pancreatitis of unclear etiology, now with acute necrotizing pancreatitis without any evidence of infection   Plan: Acute necrotizing pancreatitis of unclear etiology: Abdominal pain has improved within last 24 hours No evidence of cholelithiasis or choledocholithiasis Pancreas divisum is seen on MRCP Serum triglycerides normal Serum IgG4 normal No history of alcohol use There is no evidence of infection of the necrotic pancreas Continue postpyloric enteral feeds Continue clear liquid diet only for the next 24 to 48 hours She also developed peripancreatic fluid collections, which can involve into walled off necrosis or pseudocyst of pancreas Patient will need repeat imaging in 4 to 6 weeks Patient might develop symptoms due to mass effect from peripancreatic fluid collections, therefore need close monitoring and close follow-up as outpatient She might need pancreatic enzyme replacement therapy Recommend good bowel regimen   Isolated hyperbilirubinemia: Predominantly  indirect MRCP did not reveal choledocholithiasis, there is no evidence of biliary dilatation or obstruction No indication for ERCP at this time   History of A-fib Continue heparin drip Monitor CBC closely for signs of hemorrhage, if hemoglobin continues to remain stable for the next 24 to 48 hours, can restart oral anticoagulation   LOS: 5 days   Molly Brown 09/20/2022, 11:51 PM

## 2022-09-20 NOTE — Progress Notes (Addendum)
Progress Note   Patient: Molly Brown QJJ:[id number] DOB: [birth year] DOA: 09/15/2022     5 DOS: the patient was seen and examined on 09/20/2022   Brief hospital course: 72 y.o. 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 Patient presents for evaluation of sudden onset abdominal pain mostly in the epigastrium, periumbilical area.  She rates her pain a 10 x 10 in intensity at its worst.  Pain is nonradiating and is associated with nausea and multiple episodes of emesis.  She states that she has had issues with abdominal pain in the past usually self-limiting but this is the worst pain she has had which prompted her visit to the emergency room.  She is unable to tell me if her prior episodes of pain related to meals.  She has urinary frequency and has dyspnea with exertion which is chronic.  Her last bowel movement was 2 days prior to this admission.  According to the patient she usually has daily bowel movements She denies having any fever, no chills, no chest pain, no headache, no dizziness, no lightheadedness, no cough, no blurred vision no focal deficit. Abnormal labs include lactic acid of 2.0, lipase 539, troponin 24, white count 15.8 CT scan of abdomen and pelvis shows acute interstitial pancreatitis. No evidence of pancreatic necrosis, pancreatic ductal dilation or walled-off collections. Calcifications of the mitral annulus with left atrial dilation, consider further evaluation with cardiac echo if not previously performed. Mild pulmonary edema. Aortic Atherosclerosis. Twelve-lead EKG reviewed by me shows rapid A-fib Patient received 2 L IV fluid bolus, Cardizem 20 mg IV push x 1 and is currently on a Cardizem drip.  1/19.   Tapered off Cardizem drip and on oral metoprolol and oral Cardizem CD.  Will add empiric antibiotics for pancreatitis since procalcitonin elevated and patient does have a white count. 1/20.  Consulted general surgery because I do not have a reason for the pancreatitis.  General surgery ordered for a repeat CAT scan.  Holding Eliquis and converting over to heparin drip just in case surgery needed. 1/21.  Patient pulled out her IV and wanted to go home.  Patient was reoriented and then agreeable to surgery.  General surgery canceled procedure today.  Will advance to full liquid diet. 1/22.  With bilirubin going up I ordered an MRCP.  MRCP showing acute interstitial edematous pancreatitis with peripancreatic fluid collections with some signs of hemorrhagic or proteinaceous change.  Some peripancreatic necrosis seen.  Pancreatic ascites.  Rather striking tapered narrowing of the mid common bile duct with biliary enhancement.  Dilated main pancreatic duct.  Findings may reflect pancreatic divisum or dominant dorsal drainage via minor papilla. 1/23.  I had to advance Dobbhoff tube twice today.  Last x-ray showing in the duodenal bulb.  Okay to start tube feedings.  Assessment and Plan: * Severe acute pancreatitis Acute severe pancreatitis with signs of pancreatic necrosis and fluid collections.  Supportive care.  Currently on clear liquid diet.  Dobbhoff tube placed last night but needed to be advanced today.  Now in appropriate location to start tube feedings.  On empiric Zosyn at this point.  General surgery will not remove gallbladder at this point.  IgG4 pending.  The patient does not drink alcohol.  No new medications.  Triglycerides normal.  MRCP shows the possibility of pancreatic divisum which could increase risk of pancreatitis.  Lipase normalized pretty quickly.  Acute metabolic encephalopathy Improved after removing IV pain medications.  Atrial fibrillation with RVR (HCC) Chronic in nature.   Continue metoprolol and Cardizem CD.  Currently on heparin drip.  Hypokalemia Replace potassium today.  Obesity (BMI 30-39.9) BMI 33.45 kg/m2   HOCM (hypertrophic obstructive cardiomyopathy) ([place]) Patient with a history of HOCM Continue metoprolol.   Hypothyroidism Continue Synthroid  GERD (gastroesophageal reflux disease) Continue PPI        Subjective: Patient does not feel quite right in her abdomen.  Not having the severe pain that she had on admission.  Admitted with pancreatitis which is now severe with necrosis.  Needed to advance Dobbhoff tube this morning.  Took a couple tries but now in the correct spot to start tube feeding.  Physical Exam: Vitals:   09/20/22 0457 09/20/22 0801 09/20/22 0900 09/20/22 1158  BP: 126/73 118/66  124/74  Pulse: 100 95  96  Resp: 17 18 15 16   Temp: 98.9 F (37.2 C) 98.1 F (36.7 C)  98 F (36.7 C)  TempSrc: Oral     SpO2: 96% 94%  94%  Weight:      Height:       Physical Exam HENT:     Head: Normocephalic.     Mouth/Throat:     Pharynx: No oropharyngeal exudate.  Eyes:     General: Lids are normal.     Conjunctiva/sclera: Conjunctivae normal.  Cardiovascular:     Rate and Rhythm: Normal rate. Rhythm irregularly irregular.     Heart sounds: Normal heart sounds, S1 normal and S2 normal.  Pulmonary:     Breath sounds: Examination of the right-lower field reveals decreased breath sounds. Examination of the left-lower field reveals decreased breath sounds. Decreased breath sounds present. No wheezing, rhonchi or rales.  Abdominal:     Palpations: Abdomen is soft.     Tenderness: There is generalized abdominal tenderness.  Musculoskeletal:     Right lower leg: Swelling present.     Left lower leg: Swelling present.  Skin:    General: Skin is warm.     Findings: No rash.  Neurological:     Mental Status: She is alert.     Comments: Patient answering all questions appropriately.     Data Reviewed: Sodium 132,  potassium 3.1, creatinine 0.79, total bilirubin 3.7, will white blood cell count 11.1, hemoglobin 11.7, platelet count 259  Family Communication: Spoke with husband at the bedside  Disposition: Status is: Inpatient Remains inpatient appropriate because: Starting tube feeds for severe pancreatitis with necrosis.  Planned Discharge Destination: Home with Home Health    Time spent: 28 minutes  Author: Loletha Grayer, MD 09/20/2022 2:11 PM  For on call review [email or web address].

## 2022-09-20 NOTE — Progress Notes (Signed)
Dobhoff tube in stomach, advanced to 73 to see if it can advance through the pylorus.  Repeat xray ordered.  Dr Leslye Peer

## 2022-09-20 NOTE — Progress Notes (Signed)
Initial Nutrition Assessment  DOCUMENTATION CODES:   Obesity unspecified  INTERVENTION:   -Continue clear liquid diet -Follow for diet advancement and add supplements as appropriate -TF via post-pyloric tube  Initiate Vital 1.5 @ 20 ml/hr and increase by 10 ml every 4 hours to goal rate of 55 ml/hr.   30 ml Prosource TF daily.    170 ml free water flush every 4 hours  Tube feeding regimen provides 2040 kcals, 109 grams of protein, and 1008 ml of H2O. Total free water: 2028 ml daily  NUTRITION DIAGNOSIS:   Increased nutrient needs related to acute illness (pancreatitis) as evidenced by estimated needs.  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  Pt admitted with severe acute pancreatitis.   1/23- post pyloric feeding tube placed (tip past duodenal bulb per x-ray)   Reviewed I/O's: +180 ml x 24 hours and +3.3 L since admission  UOP: 0 ml x 24 hours   Spoke with pt and family members at bedside. Pt reports that she was eating well until 1 day PTA. Pt reports she likes to cook and eats healthfully- bakes meat in the air fryer and eats a lot of fruits, vegetables, and beans. Since admission, she has been taking sips of clear liquids. She denies any abdominal pain.   RD discussed purpose of NGT and how she will receive nutrition at this time. Explained that length of therapy is dependent on pt's response to treatment. Pt family with multiple diet related questions; expressed concern that pt's diet was a contributing factor to acute illness. RD provided supportive  presence and reassurance; reviewed general healthful diet guidelines.   RD will initiate a semi-elemental formula (Vital 1.5) to help promote tolerance. Case discussed with MD, plan to d/c IV fluids and add water flushes for hydration.   Pt denies any weight loss. Reviewed wt hx; wt has been stable over the past 3 years.   Medications reviewed and include vitamin B-12, cardizem, and miralax.   Labs reviewed: Na: 132, K: 3.1.    NUTRITION - FOCUSED PHYSICAL EXAM:  Flowsheet Row Most Recent Value  Orbital Region No depletion  Upper Arm Region No depletion  Thoracic and Lumbar Region No depletion  Buccal Region No depletion  Temple Region No depletion  Clavicle Bone Region No depletion  Clavicle and Acromion Bone Region No depletion  Scapular Bone Region No depletion  Dorsal Hand No depletion  Patellar Region No depletion  Anterior Thigh Region No depletion  Posterior Calf Region No depletion  Edema (RD Assessment) None  Hair Reviewed  Eyes Reviewed  Mouth Reviewed  Skin Reviewed  Nails Reviewed       Diet Order:   Diet Order             Diet clear liquid Fluid consistency: Thin  Diet effective now                   EDUCATION NEEDS:   Education needs have been addressed  Skin:  Skin Assessment: Reviewed RN Assessment  Last BM:  Unknown  Height:   Ht Readings from Last 1 Encounters:  09/15/22 5'  8" (1.727 m)    Weight:   Wt Readings from Last 1 Encounters:  09/15/22 99.8 kg    Ideal Body Weight:  63.6 kg  BMI:  Body mass index is 33.45 kg/m.  Estimated Nutritional Needs:   Kcal:  2050-2250  Protein:  105-120 grams  Fluid:  > 2 L    Loistine Chance, RD, LDN, [place] Registered Dietitian II Certified Diabetes Care and Education Specialist Please refer to [facility] for RD and/or RD on-call/weekend/after hours pager

## 2022-09-20 NOTE — Consult Note (Signed)
ANTICOAGULATION CONSULT NOTE  Pharmacy Consult for IV Heparin Indication: atrial fibrillation  Patient Measurements: Height: 5\' 8"  (172.7 cm) Weight: 99.8 kg (220 lb) IBW/kg (Calculated) : 63.9 Heparin Dosing Weight: 85.8 kg  Labs: Recent Labs    09/18/22 0525 09/19/22 0319 09/19/22 0816 09/19/22 1615 09/19/22 2251 09/20/22 0502  HGB 11.1* 11.1*  --   --   --  11.7*  HCT 33.8* 33.1*  --   --   --  35.4*  PLT 209 207  --   --   --  259  APTT  --   --  52* 58* 51* 80*  HEPARINUNFRC  --   --  0.71*  --   --  0.62  CREATININE 0.74 0.65  --   --   --  0.79     Estimated Creatinine Clearance: 82 mL/min (by C-G formula based on SCr of 0.79 mg/dL).   Medical History: Past Medical History:  Diagnosis Date   AF (atrial fibrillation) (HCC)    Anal fissure    Anxiety    Cardiac arrhythmia due to congenital heart disease    Chicken pox    Depression    GERD (gastroesophageal reflux disease)    Heart murmur     Medications:  Apixaban 5 mg BID (last dose 09/16/22 at 2251)  Assessment: 72 y/o F with medical history including Afib on apixaban who is admitted with acute pancreatitis. General surgery consulted. Pharmacy consulted to initiate and manage heparin infusion while apixaban is on hold pending surgery evaluation. Heparin was stopped early morning 1/21 @ 0300 for surgery but patient then became not amenable to procedure, pulled out her IV, and wanted to go home. She was reoriented and agreed to surgery, which was subsequently postponed. In order to allow time for new IV placement, per discussion with MD, we opted to give a one-time dose of enoxaparin 1 mg/kg to provide anticoagulation until IV access could be established again for heparin. IV access established again 1/21 @ ~1530.  1/20 1734 aPTT 79 before heparin started. No baseline heparin level ordered prior to giving dose of enoxaparin.  1/22 0816 aPTT 52 HL 0.71 1/22 1615 aPTT 58 1/22 2251 aPTT 51, subtherapeutic 1/23  0502 aPTT 80 therapeutic x 1, HL 0.62  Goal of Therapy:  Heparin level 0.3-0.7 units/ml once aPTT and heparin level correlate.  aPTT 66 - 102 seconds Monitor platelets by anticoagulation protocol: Yes  Plan: -Continue heparin infusion at 1800 units/hr.  -Recheck aPTT in 6 hours to confirm -HL starting to correlate, will recheck HL in 6 hr to confirm -CBC daily with AM labs.    Renda Rolls, PharmD, [facility] 09/20/2022 6:54 AM

## 2022-09-21 DIAGNOSIS — E876 Hypokalemia: Secondary | ICD-10-CM

## 2022-09-21 DIAGNOSIS — G9341 Metabolic encephalopathy: Secondary | ICD-10-CM | POA: Diagnosis not present

## 2022-09-21 DIAGNOSIS — K859 Acute pancreatitis without necrosis or infection, unspecified: Secondary | ICD-10-CM | POA: Diagnosis not present

## 2022-09-21 DIAGNOSIS — K8689 Other specified diseases of pancreas: Secondary | ICD-10-CM | POA: Diagnosis not present

## 2022-09-21 DIAGNOSIS — I4891 Unspecified atrial fibrillation: Secondary | ICD-10-CM | POA: Diagnosis not present

## 2022-09-21 LAB — BASIC METABOLIC PANEL
Anion gap: 7 (ref 5–15)
BUN: 12 mg/dL (ref 8–23)
CO2: 27 mmol/L (ref 22–32)
Calcium: 8.3 mg/dL — ABNORMAL LOW (ref 8.9–10.3)
Chloride: 96 mmol/L — ABNORMAL LOW (ref 98–111)
Creatinine, Ser: 0.79 mg/dL (ref 0.44–1.00)
GFR, Estimated: >60 mL/min (ref >60)
Glucose, Bld: 139 mg/dL — ABNORMAL HIGH (ref 70–99)
Potassium: 3.1 mmol/L — ABNORMAL LOW (ref 3.5–5.1)
Sodium: 130 mmol/L — ABNORMAL LOW (ref 135–145)

## 2022-09-21 LAB — CBC
HCT: 38.3 % (ref 36.0–46.0)
Hemoglobin: 13 g/dL (ref 12.0–15.0)
MCH: 29 pg (ref 26.0–34.0)
MCHC: 33.9 g/dL (ref 30.0–36.0)
MCV: 85.5 fL (ref 80.0–100.0)
Platelets: 283 10*3/uL (ref 150–400)
RBC: 4.48 MIL/uL (ref 3.87–5.11)
RDW: 13.7 % (ref 11.5–15.5)
WBC: 9.2 10*3/uL (ref 4.0–10.5)
nRBC: 0 % (ref 0.0–0.2)

## 2022-09-21 LAB — BLOOD GAS, VENOUS
Acid-Base Excess: 4.2 mmol/L — ABNORMAL HIGH (ref 0.0–2.0)
Bicarbonate: 28.4 mmol/L — ABNORMAL HIGH (ref 20.0–28.0)
O2 Saturation: 94.7 %
Patient temperature: 37
pCO2, Ven: 40 mmHg — ABNORMAL LOW (ref 44–60)
pH, Ven: 7.46 — ABNORMAL HIGH (ref 7.25–7.43)
pO2, Ven: 63 mmHg — ABNORMAL HIGH (ref 32–45)

## 2022-09-21 LAB — GLUCOSE, CAPILLARY
Glucose-Capillary: 159 mg/dL — ABNORMAL HIGH (ref 70–99)
Glucose-Capillary: 147 mg/dL — ABNORMAL HIGH (ref 70–99)
Glucose-Capillary: 164 mg/dL — ABNORMAL HIGH (ref 70–99)
Glucose-Capillary: 161 mg/dL — ABNORMAL HIGH (ref 70–99)
Glucose-Capillary: 153 mg/dL — ABNORMAL HIGH (ref 70–99)
Glucose-Capillary: 140 mg/dL — ABNORMAL HIGH (ref 70–99)

## 2022-09-21 LAB — AMMONIA: Ammonia: 43 umol/L — ABNORMAL HIGH (ref 9–35)

## 2022-09-21 LAB — APTT: aPTT: 63 seconds — ABNORMAL HIGH (ref 24–36)

## 2022-09-21 LAB — BILIRUBIN, TOTAL: Total Bilirubin: 2.9 mg/dL — ABNORMAL HIGH (ref 0.3–1.2)

## 2022-09-21 LAB — MAGNESIUM: Magnesium: 2.1 mg/dL (ref 1.7–2.4)

## 2022-09-21 LAB — HEPARIN LEVEL (UNFRACTIONATED): Heparin Unfractionated: 0.55 IU/mL (ref 0.30–0.70)

## 2022-09-21 MED ORDER — LACTULOSE 10 GM/15ML PO SOLN
10.0000 g | Freq: Three times a day (TID) | ORAL | Status: AC
  Administered 2022-09-21 – 2022-09-22 (×3): 10 g
  Filled 2022-09-21 (×3): qty 30

## 2022-09-21 MED ORDER — POTASSIUM CHLORIDE 20 MEQ PO PACK
40.0000 meq | PACK | Freq: Two times a day (BID) | ORAL | Status: AC
  Administered 2022-09-21 (×2): 40 meq via ORAL
  Filled 2022-09-21 (×2): qty 2

## 2022-09-21 MED ORDER — METOPROLOL TARTRATE 50 MG PO TABS
50.0000 mg | ORAL_TABLET | Freq: Three times a day (TID) | ORAL | Status: DC
  Administered 2022-09-21 – 2022-09-23 (×5): 50 mg via ORAL
  Filled 2022-09-21 (×6): qty 1

## 2022-09-21 NOTE — Progress Notes (Signed)
Molly Repress, MD [address]  [address]  [place], [place] [address]  Main: [phone]  Fax: [phone] Pager: [phone]   Subjective: Patient reports that her abdominal pain is the same.  She is passing a lot of gas, feels distended and hard.  Feels like having a bowel movement, received MiraLAX and lactulose.  She is started on postpyloric tube feeds.  On clear liquid diet only.  Denies any nausea or vomiting, fever, chills.  Patient's husband is bedside   Objective: Vital signs in last 24 hours: Vitals:   09/21/22 0253 09/21/22 0751 09/21/22 1224 09/21/22 1604  BP: (!) 132/95 111/66 97/69 124/72  Pulse: (!) 107 92 88 91  Resp:  20 20 18   Temp:  98.2 F (36.8 C) 98.5 F (36.9 C) 98.1 F (36.7 C)  TempSrc:  Oral Oral   SpO2: 94% 94% 93% 94%  Weight:      Height:       Weight change:   Intake/Output Summary (Last 24 hours) at 09/21/2022 1805 Last data filed at 09/21/2022 1527 Gross per 24 hour  Intake 3247.51 ml  Output --  Net 3247.51 ml     Exam: Heart:: Regular rate and rhythm Lungs: normal Abdomen: Distended, dull, nontender, hypoactive bowel sounds   Lab Results:    Latest Ref Rng & Units 09/21/2022    4:28 AM 09/20/2022    5:02 AM 09/19/2022    3:19 AM  CBC  WBC 4.0 - 10.5 K/uL 9.2  11.1  10.0   Hemoglobin 12.0 - 15.0 g/dL 09/21/2022  70.2  63.7   Hematocrit 36.0 - 46.0 % 38.3  35.4  33.1   Platelets 150 - 400 K/uL 283  259  207       Latest Ref Rng & Units 09/21/2022    4:28 AM 09/20/2022    5:02 AM 09/19/2022    3:19 AM  CMP  Glucose 70 - 99 mg/dL 09/21/2022  92  96   BUN 8 - 23 mg/dL 12  13  14    Creatinine 0.44 - 1.00 mg/dL 850   2.77   Sodium 135 - 145 mmol/L 130  132  130   Potassium 3.5 - 5.1 mmol/L 3.1  3.1  3.7   Chloride 98 - 111 mmol/L 96  97  100   CO2 22 - 32 mmol/L 27  26  24    Calcium 8.9 - 10.3 mg/dL 8.3  8.5  8.2   Total Protein 6.5 - 8.1 g/dL  6.3  5.6   Total Bilirubin 0.3 - 1.2 mg/dL 2.9  3.7  3.6   Alkaline Phos  38 - 126 U/L  57  44   AST 15 - 41 U/L  32  26   ALT 0 - 44 U/L  23  18     Micro Results: Recent Results (from the past 240 hour(s))  Blood culture (routine x 2)     Status: None   Collection Time: 09/15/22  6:09 AM   Specimen: BLOOD  Result Value Ref Range Status   Specimen Description BLOOD  RIGHT AC  Final   Special Requests   Final    BOTTLES DRAWN AEROBIC AND ANAEROBIC Blood Culture results may not be optimal due to an excessive volume of blood received in culture bottles   Culture   Final    NO GROWTH 5 DAYS Performed at [facility], [address], [place], [facility] [address]    Report  Status 09/20/2022 FINAL  Final  Blood culture (routine x 2)     Status: None   Collection Time: 09/15/22  6:09 AM   Specimen: BLOOD  Result Value Ref Range Status   Specimen Description BLOOD  LEFT [facility]  Final   Special Requests   Final    BOTTLES DRAWN AEROBIC AND ANAEROBIC Blood Culture adequate volume   Culture   Final    NO GROWTH 5 DAYS Performed at [facility], [address], [place], [place] [address]    Report Status 09/20/2022 FINAL  Final   Studies/Results: DG Abd 1 View  Result Date: 09/20/2022 CLINICAL DATA:  Feeding tube placement EXAM: ABDOMEN - 1 VIEW COMPARISON:  09/20/2022 8:56 a.m. FINDINGS: The feeding tube tip projects over the duodenal bulb. Gas visualized in the transverse colon. Mild subsegmental atelectasis suspected at the lung bases. IMPRESSION: 1. Feeding tube tip projects over the duodenal bulb. Electronically Signed   By: Gaylyn Rong M.D.   On: 09/20/2022 12:07   DG Abd 1 View  Result Date: 09/20/2022 CLINICAL DATA:  Feeding tube placement EXAM: ABDOMEN - 1 VIEW COMPARISON:  09/19/2022 FINDINGS: Feeding tube within the proximal gastric body. No change from prior. IMPRESSION: No change in feeding tube position.  Stylet removed Electronically Signed   By: Genevive Bi M.D.   On: 09/20/2022 09:28   DG Abd 1 View  Result Date:  09/20/2022 CLINICAL DATA:  [id number].  Check feeding tube positioning. EXAM: ABDOMEN - 1 VIEW COMPARISON:  KUB 09/15/2022, CT abdomen and pelvis 09/17/2022 FINDINGS: The bowel gas pattern is nonobstructive. No radio-opaque calculi or other significant radiographic abnormality are seen. Feeding tube extends to the left within the stomach with the tip in the proximal body of the stomach. No free air is seen. There is streaky atelectasis in the lung bases. IMPRESSION: Feeding tube extends to the left within the stomach with the tip in the proximal body of the stomach. Electronically Signed   By: Almira Bar M.D.   On: 09/20/2022 02:09   Medications: I have reviewed the patient's current medications. Prior to Admission:  Medications Prior to Admission  Medication Sig Dispense Refill Last Dose   acetaminophen (TYLENOL) 325 MG tablet Take 650 mg by mouth every 6 (six) hours as needed.   prn   apixaban (ELIQUIS) 5 MG TABS tablet Take 1 tablet (5 mg total) by mouth 2 (two) times daily. 60 tablet 2 09/14/2022 at 0800   cyanocobalamin 500 MCG tablet Take 500 mcg by mouth daily.   09/14/2022   levothyroxine (SYNTHROID) 25 MCG tablet Take 1 tablet (25 mcg total) by mouth daily at 6 (six) AM. 30 tablet 0 09/14/2022   metoprolol tartrate (LOPRESSOR) 50 MG tablet Take 1 tablet (50 mg total) by mouth 2 (two) times daily. 60 tablet 1 09/14/2022   Multiple Vitamin (MULTIVITAMIN) LIQD Take 5 mLs by mouth daily.   09/14/2022   omeprazole (PRILOSEC) 40 MG capsule TAKE 1 CAPSULE (40 MG TOTAL) BY MOUTH DAILY. 90 capsule 3 09/14/2022   polyethylene glycol (MIRALAX / GLYCOLAX) packet Take 17 g by mouth as needed.   prn   tacrolimus (PROTOPIC) 0.1 % ointment APPLY TO AFFECTED AREA(S) TWICE A DAY AS DIRECTED   prn   dexamethasone (DECADRON) 6 MG tablet Take 1 tablet (6 mg total) by mouth daily. (Patient not taking: Reported on 09/15/2022) 3 tablet 0 Not Taking   Probiotic Product (PROBIOTIC DAILY) CAPS Take by mouth. (Patient not  taking: Reported on  09/15/2022)   Not Taking   Scheduled:  Chlorhexidine Gluconate Cloth  6 each Topical Q0600   cyanocobalamin  500 mcg Oral Daily   diltiazem  180 mg Oral Daily   feeding supplement (PROSource TF20)  60 mL Per Tube Daily   free water  170 mL Per Tube Q4H   lactulose  10 g Per Tube TID   levothyroxine  25 mcg Oral Q0600   metoprolol tartrate  50 mg Oral TID   multivitamin with minerals  1 tablet Oral Daily   pantoprazole (PROTONIX) IV  40 mg Intravenous Q12H   potassium chloride  40 mEq Oral BID   Continuous:  feeding supplement (VITAL 1.5 CAL) 1,000 mL (09/21/22 1150)   heparin 1,800 Units/hr (09/21/22 1527)   piperacillin-tazobactam (ZOSYN)  IV 3.375 g (09/21/22 0912)   TIR:[id number], ondansetron **OR** ondansetron (ZOFRAN) IV, oxyCODONE Anti-infectives (From admission, onward)    Start     Dose/Rate Route Frequency Ordered Stop   09/16/22 1415  piperacillin-tazobactam (ZOSYN) IVPB 3.375 g        3.375 g 12.5 mL/hr over 240 Minutes Intravenous Every 8 hours 09/16/22 1410 09/21/22 2359      Scheduled Meds:  Chlorhexidine Gluconate Cloth  6 each Topical Q0600   cyanocobalamin  500 mcg Oral Daily   diltiazem  180 mg Oral Daily   feeding supplement (PROSource TF20)  60 mL Per Tube Daily   free water  170 mL Per Tube Q4H   lactulose  10 g Per Tube TID   levothyroxine  25 mcg Oral Q0600   metoprolol tartrate  50 mg Oral TID   multivitamin with minerals  1 tablet Oral Daily   pantoprazole (PROTONIX) IV  40 mg Intravenous Q12H   potassium chloride  40 mEq Oral BID   Continuous Infusions:  feeding supplement (VITAL 1.5 CAL) 1,000 mL (09/21/22 1150)   heparin 1,800 Units/hr (09/21/22 1527)   piperacillin-tazobactam (ZOSYN)  IV 3.375 g (09/21/22 0912)   PRN Meds:.acetaminophen, ondansetron **OR** ondansetron (ZOFRAN) IV, oxyCODONE   Assessment: Principal Problem:   Severe acute pancreatitis Active Problems:   GERD (gastroesophageal reflux disease)    Hypothyroidism   Atrial fibrillation with RVR (HCC)   HOCM (hypertrophic obstructive cardiomyopathy) (HCC)   Obesity (BMI 30-39.9)   Acute metabolic encephalopathy   Sterile pancreatic necrosis   Acute pancreatitis   Hypokalemia  Molly Brown is a 72 y.o. 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  Etiology of her pancreatitis Serum triglycerides normal Serum IgG4 normal No history of alcohol use There is no evidence of infection of the necrotic pancreas Continue postpyloric enteral feeds and transition to small frequent, low fat, low carb, high protein diet when able to Slowly advance diet She also developed peripancreatic fluid collections, which can evolve into walled off necrosis or pseudocyst of pancreas Patient will need repeat imaging in 4 to 6 weeks Patient might develop symptoms due to mass effect from peripancreatic fluid collections, therefore need close monitoring and close follow-up as outpatient She might need pancreatic enzyme replacement therapy Recommend good bowel regimen   Isolated hyperbilirubinemia: Predominantly indirect MRCP did not reveal choledocholithiasis, there is no evidence of biliary dilatation or obstruction No indication for ERCP at  this time   History of A-fib On heparin drip currently, Hb is normal Can restart oral anticoagulation  GI will sign off at this time, please call us back with questions or concerns   LOS: 6 days   Threasa Kinch 09/21/2022, 6:05 PM

## 2022-09-21 NOTE — TOC Initial Note (Signed)
Transition of Care [facility]) - Initial/Assessment Note    Patient Details  Name: Molly Brown MRN: [id number] Date of Birth: [birth year]  [facility] CM/SW Contact:    Tiburcio Bash, LCSW Phone Number: 09/21/2022, 2:40 PM  Clinical Narrative:                  CSW spoke with patient and family regarding HH recommendations, they report patient spouse Molly Brown will be at the hospital tomorrow morning 1/25 to follow up then for his decision.   Expected Discharge Plan: [place] Barriers to Discharge: Continued Medical Work up   Patient Goals and CMS Choice Patient states their goals for this hospitalization and ongoing recovery are:: to go home CMS Medicare.gov Compare Post Acute Care list provided to:: Patient Choice offered to / list presented to : Patient      Expected Discharge Plan and Services       Living arrangements for the past 2 months: Single Family Home                                      Prior Living Arrangements/Services Living arrangements for the past 2 months: [place] Lives with:: Spouse                   Activities of Daily Living Home Assistive Devices/Equipment: Eyeglasses ADL Screening (condition at time of admission) Patient's cognitive ability adequate to safely complete daily activities?: Yes Is the patient deaf or have difficulty hearing?: No Does the patient have difficulty seeing, even when wearing glasses/contacts?: No Does the patient have difficulty concentrating, remembering, or making decisions?: No Patient able to express need for assistance with ADLs?: Yes Does the patient have difficulty dressing or bathing?: No Independently performs ADLs?: Yes (appropriate for developmental age) Does the patient have difficulty walking or climbing stairs?: No Weakness of Legs: None Weakness of Arms/Hands: None  Permission Sought/Granted                  Emotional Assessment               Admission diagnosis:  Atrial fibrillation with rapid ventricular response ([place]) [I48.91] Atrial fibrillation with RVR ([place]) [I48.91] Acute pancreatitis, unspecified complication status, unspecified pancreatitis type [K85.90] Patient Active Problem List   Diagnosis Date Noted   Hypokalemia 09/20/2022   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   Acute pancreatitis 26/94/8546   Acute metabolic encephalopathy 27/10/5007   Atrial fibrillation with RVR ([place]) 09/15/2022   Severe acute pancreatitis 09/15/2022   HOCM (hypertrophic obstructive cardiomyopathy) ([place]) 09/15/2022   Obesity (BMI 30-39.9) 09/15/2022   Gastroenteritis due to COVID-19 virus 10/06/2019   Pneumonia due to COVID-19 virus 10/06/2019   Hypothyroidism 11/03/2015   Elevated TSH 07/28/2015   Hyperlipidemia, mixed 07/01/2015   GERD (gastroesophageal reflux disease) 05/25/2015   Exertional dyspnea 06/05/2014   Routine general medical examination at a health care facility 02/24/2014   Encounter for routine gynecological examination 02/24/2014   Chronic atrial fibrillation ([place]) 02/07/2014   Dysphagia, unspecified(787.20) 01/23/2014   Other malaise and fatigue 01/23/2014   PCP:  Patient, No Pcp Per Pharmacy:   [id number] - [place], [place] [address] Phone: [phone] Fax: [phone]     Social Determinants of Health (SDOH) Social History: SDOH Screenings   Food Insecurity: No Food Insecurity (09/16/2022)  Housing: Low Risk  (09/16/2022)  Transportation Needs: No Transportation Needs (09/16/2022)  Utilities: Not At Risk (09/16/2022)  Tobacco Use: Medium Risk (09/16/2022)   SDOH Interventions:     Readmission Risk Interventions     No data to display

## 2022-09-21 NOTE — Assessment & Plan Note (Signed)
See severe acute pancreatitis

## 2022-09-21 NOTE — Evaluation (Signed)
Physical Therapy Evaluation Patient Details Name: Molly Brown MRN: [id number] DOB: 1951-08-09 Today's Date: 09/21/2022  History of Present Illness  Pt is a 72 y.o. 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  MD assessment includes: Acute metabolic encephalopathy, hypokalemia, and hypertrophic obstructive cardiomyopathy.   Clinical Impression  Pt was pleasant but somewhat lethargic and put forth good effort throughout.  Pt required no physical assistance during the session but did require cuing for hand placement during transfers from various height surfaces and to ambulate closer to the RW with gait.  Pt was generally steady in standing with no LOB or buckling and with no adverse symptoms noted other than baseline abdominal pain.  Pt will benefit from HHPT upon discharge to safely address deficits listed in patient problem list for decreased caregiver assistance and eventual return to PLOF.          Recommendations for follow up therapy are one component of a multi-disciplinary discharge planning process, led by the attending physician.  Recommendations may be updated based on patient status, additional functional criteria and insurance authorization.  Follow Up Recommendations Home health PT      Assistance Recommended at Discharge Frequent or constant Supervision/Assistance  Patient can return home with the following  A little help with walking and/or transfers;A little help with bathing/dressing/bathroom;Assistance with cooking/housework;Assist for transportation;Help with stairs or ramp for entrance    Equipment Recommendations None recommended by PT  Recommendations for Other Services       Functional Status Assessment Patient has had a recent decline in their functional status and  demonstrates the ability to make significant improvements in function in a reasonable and predictable amount of time.     Precautions / Restrictions Precautions Precautions: Fall Restrictions Weight Bearing Restrictions: No Other Position/Activity Restrictions: HOB to >/= 30 deg      Mobility  Bed Mobility Overal bed mobility: Modified Independent             General bed mobility comments: Min extra time and effort only    Transfers Overall transfer level: Needs assistance Equipment used: Rolling walker (2 wheels) Transfers: Sit to/from Stand Sit to Stand: Min guard           General transfer comment: Mod verbal and tactile cues for hand placement    Ambulation/Gait Ambulation/Gait assistance: Min guard Gait Distance (Feet): 4 Feet Assistive device: Rolling walker (2 wheels) Gait Pattern/deviations: Step-through pattern, Decreased step length - right, Decreased step length - left, Trunk flexed Gait velocity: decreased     General Gait Details: Pt able to amb 4 feet around the EOB and from bed to chair with good stability but fatigued quickly with poor activity tolerance  Stairs            Wheelchair Mobility    Modified Rankin (Stroke Patients Only)       Balance Overall balance assessment: Needs assistance   Sitting balance-Leahy Scale: Normal     Standing balance support: Bilateral upper extremity supported, During functional activity Standing balance-Leahy Scale: Good                               Pertinent Vitals/Pain Pain Assessment Pain Assessment: 0-10 Pain Score: 5  Pain Location: abdominal area Pain Descriptors / Indicators: Aching, Sore Pain Intervention(s): Repositioned, Premedicated  before session, Monitored during session    [place] expects to be discharged to:: Private residence Living Arrangements: Spouse/significant other Available Help at Discharge: Family;Available 24 hours/day Type of  Home: House Home Access: Stairs to enter Entrance Stairs-Rails: Right;Left (too wide for both) Entrance Stairs-Number of Steps: 5   Home Layout: Two level;Able to live on main level with bedroom/bathroom Home Equipment: BSC/3in1;Rollator (4 wheels);Crutches      Prior Function               Mobility Comments: Ind amb community distances without an AD, no fall history ADLs Comments: Ind with ADLs     Hand Dominance        Extremity/Trunk Assessment   Upper Extremity Assessment Upper Extremity Assessment: Generalized weakness    Lower Extremity Assessment Lower Extremity Assessment: Generalized weakness       Communication   Communication: No difficulties  Cognition Arousal/Alertness: Lethargic Behavior During Therapy: Flat affect Overall Cognitive Status: Within Functional Limits for tasks assessed                                          General Comments      Exercises Total Joint Exercises Ankle Circles/Pumps: AROM, Strengthening, Both, 10 reps Quad Sets: Strengthening, Both, 10 reps Gluteal Sets: Strengthening, Both, 5 reps Long Arc Quad: AROM, Strengthening, Both, 10 reps Knee Flexion: AROM, Strengthening, Both, 10 reps Other Exercises Other Exercises: Pt/spouse education on HEP for BLE LAQs, APs, QS, and GS x 10 each every 1-2 hours daily as able   Assessment/Plan    PT Assessment Patient needs continued PT services  PT Problem List Decreased strength;Decreased activity tolerance;Decreased balance;Decreased mobility;Decreased knowledge of use of DME;Pain       PT Treatment Interventions DME instruction;Gait training;Stair training;Functional mobility training;Therapeutic activities;Therapeutic exercise;Balance training;Patient/family education    PT Goals (Current goals can be found in the Care Plan section)  Acute Rehab PT Goals Patient Stated Goal: To get stronger PT Goal Formulation: With patient Time For Goal Achievement:  10/04/22 Potential to Achieve Goals: Good    Frequency Min 2X/week     Co-evaluation               AM-PAC PT "6 Clicks" Mobility  Outcome Measure Help needed turning from your back to your side while in a flat bed without using bedrails?: A Little Help needed moving from lying on your back to sitting on the side of a flat bed without using bedrails?: A Little Help needed moving to and from a bed to a chair (including a wheelchair)?: A Little Help needed standing up from a chair using your arms (e.g., wheelchair or bedside chair)?: A Little Help needed to walk in hospital room?: A Little Help needed climbing 3-5 steps with a railing? : A Lot 6 Click Score: 17    End of Session Equipment Utilized During Treatment: Gait belt Activity Tolerance: Patient tolerated treatment well Patient left: in chair;with call bell/phone within reach;with chair alarm set;with family/visitor present Nurse Communication: Mobility status PT Visit Diagnosis: Muscle weakness (generalized) (M62.81);Difficulty in walking, not elsewhere classified (R26.2);Pain Pain - part of body:  (abdomen)    Time: 3151-7616 PT Time Calculation (min) (ACUTE ONLY): 30 min   Charges:   PT Evaluation $PT Eval Moderate Complexity: 1 Mod PT Treatments $Therapeutic Exercise: 8-22 mins       D. Scott Harim Bi PT,  DPT 09/21/22, 12:09 PM

## 2022-09-21 NOTE — Plan of Care (Signed)
  Problem: Health Behavior/Discharge Planning: Goal: Ability to manage health-related needs will improve Outcome: Progressing   Problem: Clinical Measurements: Goal: Will remain free from infection Outcome: Progressing   Problem: Clinical Measurements: Goal: Diagnostic test results will improve Outcome: Progressing

## 2022-09-21 NOTE — Consult Note (Signed)
ANTICOAGULATION CONSULT NOTE  Pharmacy Consult for IV Heparin Indication: atrial fibrillation  Patient Measurements: Height: 5\' 8"  (172.7 cm) Weight: 99.8 kg (220 lb) IBW/kg (Calculated) : 63.9 Heparin Dosing Weight: 85.8 kg  Labs: Recent Labs    09/19/22 0319 09/19/22 0816 09/20/22 0502 09/20/22 1228 09/21/22 0428  HGB 11.1*  --  11.7*  --  13.0  HCT 33.1*  --  35.4*  --  38.3  PLT 207  --  259  --  283  APTT  --    < > 80* 77* 63*  HEPARINUNFRC  --    < > 0.62 0.53 0.55  CREATININE 0.65  --  0.79  --  0.79   < > = values in this interval not displayed.     Estimated Creatinine Clearance: 82 mL/min (by C-G formula based on SCr of 0.79 mg/dL).   Medical History: Past Medical History:  Diagnosis Date   AF (atrial fibrillation) (HCC)    Anal fissure    Anxiety    Cardiac arrhythmia due to congenital heart disease    Chicken pox    Depression    GERD (gastroesophageal reflux disease)    Heart murmur     Medications:  Apixaban 5 mg BID (last dose 09/16/22 at 2251)  Assessment: 72 y/o F with medical history including Afib on apixaban who is admitted with acute pancreatitis. General surgery consulted. Pharmacy consulted to initiate and manage heparin infusion while apixaban is on hold pending surgery evaluation. Heparin was stopped early morning 1/21 @ 0300 for surgery but patient then became not amenable to procedure, pulled out her IV, and wanted to go home. She was reoriented and agreed to surgery, which was subsequently postponed. In order to allow time for new IV placement, per discussion with MD, we opted to give a one-time dose of enoxaparin 1 mg/kg to provide anticoagulation until IV access could be established again for heparin. IV access established again 1/21 @ ~1530.  1/20 1734 aPTT 79 before heparin started. No baseline heparin level ordered prior to giving dose of enoxaparin.  1/22 0816 aPTT 52 HL 0.71 1/22 1615 aPTT 58 1/22 2251 aPTT 51,  subtherapeutic 1/23 0502 aPTT 80 therapeutic x 1, HL 0.62 1/23 1228 aPTT 77 HL 0.53 1/24 0428 HL 0.55 therapeutic x 3, aPTT 63  Goal of Therapy:  Heparin level 0.3-0.7 units/ml once aPTT and heparin level correlate.  aPTT 66 - 102 seconds Monitor platelets by anticoagulation protocol: Yes  Plan: Will continue heparin infusion at 1800 units/hr. Recheck HL/CBC daily with AM labs while therapeutic.   Renda Rolls, PharmD, [facility] 09/21/2022 5:07 AM

## 2022-09-21 NOTE — Progress Notes (Addendum)
[facility] Cardiology  Patient ID: Molly Brown MRN: [id number] DOB/AGE: 70/26/1952 70 y.o.   Admit date: 09/15/2022 Referring Physician Dr. Francine Graven  Primary Physician  Primary Cardiologist Dr. Nehemiah Massed Reason for Consultation rapid atrial fibrillation  HPI: Molly Brown is a 71YOF with a PMH of HOCM (EF >55% 12/2021), chronic AF on eliquis, OSA, HLD, who presented to [facility] ED 09/15/2022 with sudden onset abdominal pain x 1 day.  She was in AF RVR on presentation, CT abd/pelv concerning for acute interstitial pancreatitis, MRCP without choledocholithiasis, severe necrotizing pancreatitis, no surgical intervention planned.  Cardiology is following the floor atrial fibrillation.  Interval History: -Does not feel good today, reports more abdominal pain, feels some heart racing -No chest pain or shortness of breath -Remains in atrial fibrillation with rate increased today 120s-140s prior to administration of morning diltiazem  Vitals:   09/20/22 1715 09/20/22 2020 09/21/22 0253 09/21/22 0751  BP: 123/67 111/75 (!) 132/95 111/66  Pulse: 96 (!) 113 (!) 107 92  Resp: 16 16  20   Temp: 98 F (36.7 C) 98.6 F (37 C)  98.2 F (36.8 C)  TempSrc:  Oral  Oral  SpO2: 93% 93% 94% 94%  Weight:      Height:         Intake/Output Summary (Last 24 hours) at 09/21/2022 1134 Last data filed at 09/21/2022 [id number] Gross per 24 hour  Intake 2244.62 ml  Output 0 ml  Net 2244.62 ml     PHYSICAL EXAM General: ill-appearing Caucasian female, well nourished, in no acute distress.   HEENT:  Normocephalic and atraumatic. NGT in place Neck:   No JVD.  Lungs: Normal respiratory effort on oxygen on room air, poor inspiratory effort Heart: tachycardic Irregularly irregular  Normal S1 and S2 .  Blowing holosystolic murmur heard throughout Abdomen: Mildly-distended appearing.  Msk: Normal strength and tone for age. Extremities: No clubbing, cyanosis.  Trace edema.  Neuro: Alert and oriented x3, fatigued Psych: Calm  and cooperative   LABS: Basic Metabolic Panel: Recent Labs    09/20/22 0502 09/21/22 0428  NA 132* 130*  K 3.1* 3.1*  CL 97* 96*  CO2 26 27  GLUCOSE 92 139*  BUN 13 12  CREATININE 0.79 0.79  CALCIUM 8.5* 8.3*  MG  --  2.1    Liver Function Tests: Recent Labs    09/19/22 0319 09/20/22 0502 09/21/22 0428  AST 26 32  --   ALT 18 23  --   ALKPHOS 44 57  --   BILITOT 3.6* 3.7* 2.9*  PROT 5.6* 6.3*  --   ALBUMIN 2.4* 2.6*  --     Recent Labs    09/19/22 0319 09/20/22 0502  LIPASE 26 28    CBC: Recent Labs    09/20/22 0502 09/21/22 0428  WBC 11.1* 9.2  HGB 11.7* 13.0  HCT 35.4* 38.3  MCV 86.1 85.5  PLT 259 283    Cardiac Enzymes: No results for input(s): "CKTOTAL", "CKMB", "CKMBINDEX", "TROPONINI" in the last 72 hours. BNP: Invalid input(s): "POCBNP" D-Dimer: No results for input(s): "DDIMER" in the last 72 hours. Hemoglobin A1C: No results for input(s): "HGBA1C" in the last 72 hours. Fasting Lipid Panel: No results for input(s): "CHOL", "HDL", "LDLCALC", "TRIG", "CHOLHDL", "LDLDIRECT" in the last 72 hours.  Thyroid Function Tests: No results for input(s): "TSH", "T4TOTAL", "T3FREE", "THYROIDAB" in the last 72 hours.  Invalid input(s): "FREET3" Anemia Panel: No results for input(s): "VITAMINB12", "FOLATE", "FERRITIN", "TIBC", "IRON", "RETICCTPCT" in the last 72 hours.  DG  Abd 1 View  Result Date: 09/20/2022 CLINICAL DATA:  Feeding tube placement EXAM: ABDOMEN - 1 VIEW COMPARISON:  09/20/2022 8:56 a.m. FINDINGS: The feeding tube tip projects over the duodenal bulb. Gas visualized in the transverse colon. Mild subsegmental atelectasis suspected at the lung bases. IMPRESSION: 1. Feeding tube tip projects over the duodenal bulb. Electronically Signed   By: Van Clines M.D.   On: 09/20/2022 12:07   DG Abd 1 View  Result Date: 09/20/2022 CLINICAL DATA:  Feeding tube placement EXAM: ABDOMEN - 1 VIEW COMPARISON:  09/19/2022 FINDINGS: Feeding tube  within the proximal gastric body. No change from prior. IMPRESSION: No change in feeding tube position.  Stylet removed Electronically Signed   By: Suzy Bouchard M.D.   On: 09/20/2022 09:28   DG Abd 1 View  Result Date: 09/20/2022 CLINICAL DATA:  [id number].  Check feeding tube positioning. EXAM: ABDOMEN - 1 VIEW COMPARISON:  KUB 09/15/2022, CT abdomen and pelvis 09/17/2022 FINDINGS: The bowel gas pattern is nonobstructive. No radio-opaque calculi or other significant radiographic abnormality are seen. Feeding tube extends to the left within the stomach with the tip in the proximal body of the stomach. No free air is seen. There is streaky atelectasis in the lung bases. IMPRESSION: Feeding tube extends to the left within the stomach with the tip in the proximal body of the stomach. Electronically Signed   By: Telford Nab M.D.   On: 09/20/2022 02:09   MR 3D Recon At Scanner  Result Date: 09/19/2022 CLINICAL DATA:  MIP reconstructions of the biliary tree which were generated for the MRCP, MRI of the abdomen with and without contrast which is reported separately. EXAM: 3-DIMENSIONAL MR IMAGE RENDERING ON ACQUISITION WORKSTATION TECHNIQUE: 3-dimensional MR images were rendered by post-processing of the original MR data on an acquisition workstation. The 3-dimensional MR images were interpreted and findings were reported in the accompanying complete MR report for this study COMPARISON:  MRCP reported separately. FINDINGS: MIP reconstructions in 3D were provided for interpretation of MRCP/MRI of the abdomen with and without contrast. The report is found under the a session number [id number] CHL. IMPRESSION: Please refer to report for MRI abdomen/MRCP with a session number outlined above. Electronically Signed   By: Zetta Bills M.D.   On: 09/19/2022 14:47   MR ABDOMEN MRCP W WO CONTAST  Result Date: 09/19/2022 CLINICAL DATA:  Acute, severe pancreatitis. EXAM: MRI ABDOMEN WITHOUT AND WITH CONTRAST  (INCLUDING MRCP) TECHNIQUE: Multiplanar multisequence MR imaging of the abdomen was performed both before and after the administration of intravenous contrast. Heavily T2-weighted images of the biliary and pancreatic ducts were obtained, and three-dimensional MRCP images were rendered by post processing. CONTRAST:  52mL GADAVIST GADOBUTROL 1 MMOL/ML IV SOLN COMPARISON:  September 15, 2022 and CT imaging from September 17, 2022 FINDINGS: Lower chest: Small bilateral pleural effusions. Basilar atelectasis. Cardiomegaly, lung bases not well assessed. Bilateral breast implants in place. Hepatobiliary: Motion limited assessment of hepatic parenchyma without gross signs of focal, suspicious hepatic abnormality. Gallbladder mild-to-moderately distended with signs of pericholecystic fluid which is mildly increased compared to previous CT imaging from September 17, 2022 and is associated with perihepatic fluid and inflammation in the anterior pararenal space. No signs of biliary duct dilation. No visible cholelithiasis. Attenuation of the distal common bile duct is smooth and long segment in the intrapancreatic portion of the common bile duct. Dilated main pancreatic duct is mildly irregular and is only mildly dilated. Suspect small Santorinicele at the minor papilla (image 33/15) configuration  of ductal structures suggests either pancreatic divisum or dominant dorsal drainage via minor papilla which can be a risk factor for pancreatitis. Cystic duct inserts upon the midportion of the extrahepatic biliary tree just above the pancreatic portion of the common bile duct. Pancreas: Edema within the pancreas. Intrinsic T1 signal in the pancreas is largely preserved as is enhancement. Marked peripancreatic stranding. 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 Peripancreatic fluid collections some with signs of hemorrhagic or proteinaceous change, for instance in the lesser sac and area measuring 2.3 x 2.0 cm (image 48/19) fluid in  the abdomen predominantly low on T1 and high on T2 with edema and hemorrhagic/proteinaceous material tracking throughout the root of the small bowel mesentery. Assessment of upper abdominal structures with markedly limited evaluation due to respiratory variation and motion. Portal vein and splenic vein remain patent. There is a small collection with mild mass effect posterior to the portal vein best seen on image 53/21, this measures 18 x 14 mm present on previous imaging, perhaps slightly enlarged. Spleen:  Splenic vein is patent.  Spleen normal size. Adrenals/Urinary Tract:  Adrenal glands are normal. Kidneys with smooth contours and Bosniak category II cysts for which no additional dedicated follow-up imaging is recommended. Largest 13 mm in the lower pole of the RIGHT kidney. No perinephric stranding. No hydronephrosis. Stomach/Bowel: Marked duodenal edema. Inflammation and likely fat necrosis or early fat necrosis at the root of the small bowel mesentery. Mild distension of the colon with: Cut off the splenic flexure. Colon at 5 cm currently. Vascular/Lymphatic: No pathologically enlarged lymph nodes identified. No abdominal aortic aneurysm demonstrated. Other:  Flank edema bilaterally. Musculoskeletal: No suspicious bone lesions identified. IMPRESSION: 1. Findings of acute interstitial edematous pancreatitis with peripancreatic fluid collections some with signs of hemorrhagic or proteinaceous change. Appearance of peripancreatic changes with some worsening since prior imaging suggestive peripancreatic necrosis. Pancreatic ascites and more simple appearing fluid elsewhere with slight interval increase. 2. Scattered areas of acute pancreatic fluid and or peripancreatic necrosis associated with mass effect upon the portal vein and root of the small bowel mesentery. Venous structures remain patent. Short interval follow-up may be warranted to ensure continued patency. 3. No cholelithiasis or signs of  choledocholithiasis. 4. Rather striking tapered narrowing of the mid common bile duct with biliary enhancement. This may be reactive secondary to pancreatitis and is not associated currently with biliary duct dilation. Given appearance of the biliary tree would also correlate with any laboratory evidence of IGg4 related disease that could explain biliary and pancreatic abnormalities though pancreatitis could be related to potential anatomic abnormality described below. 5. Dilated main pancreatic duct is mildly irregular and is only mildly dilated. Suspect small Santorinicele at the minor papilla. Findings may reflect either pancreatic divisum or dominant dorsal drainage via minor papilla which can be a risk factor for pancreatitis. In general assessment is limited by respiratory and patient related motion. 6. Marked duodenal edema if there are continued symptoms which do not follow the trend of expected evolution for pancreatitis and or pancreatic enzymes could consider the possibility of either severe secondary or primary duodenitis. 7. Inflammation of affecting the splenic flexure of the colon with upstream dilation is a common site of functional colonic narrowing or obstruction in the setting of pancreatitis. Suggest attention on follow-up. 8. Small bilateral pleural effusions and basilar atelectasis. 9. Motion limited assessment of hepatic parenchyma without gross signs of focal, suspicious hepatic abnormality. Electronically Signed   By: Donzetta Kohut M.D.   On: 09/19/2022  14:19     Echo EF>55%, asymmetric septal hypertrophy consistent with hypertrophic obstructive cardiomyopathy, moderate mitral regurgitation  TELEMETRY: Atrial fibrillation 90s - low  100s overnight with increase rate to 120s-140s this morning  ASSESSMENT AND PLAN:  Principal Problem:   Severe acute pancreatitis Active Problems:   GERD (gastroesophageal reflux disease)   Hypothyroidism   Atrial fibrillation with RVR (HCC)    HOCM (hypertrophic obstructive cardiomyopathy) (HCC)   Obesity (BMI 30-39.9)   Acute metabolic encephalopathy   Sterile pancreatic necrosis   Acute pancreatitis   Hypokalemia    1. Chronic atrial fibrillation, mildly elevated heart rate in the setting of pancreatitis, on Eliquis for stroke prevention, switched to heparin infusion preparation for possible cholecystectomy, s/p diltiazem infusion, now on Cardizem CD 180 mg daily and metoprolol tartrate 50 mg twice daily for rate control. 2.  Acute necrotizing pancreatitis, currently managed conservatively, on IV zosyn, Dobbhoff tube placed 1/23 for enteral feeds 3.  Hypertrophic obstructive cardiomyopathy, asymmetric septal hypertrophy (SWT 2.0 cm ) observed on 2D echocardiogram 12/30/2021.  Euvolemic on room air 1/23   Recommendations   1.  Agree with current therapy 2.  Continue heparin for now, resume Eliquis once ok from a GI/surgical perspective 3.  Continue Cardizem CD 180CD, increase metoprolol tartrate to 50mg  TID 4.  S/p IV Lasix 40 mg x 1 on 1/22, hold off repeat dosing for now 5.  Continue to treat causes of increased adrenergic tone including pain, electrolyte disturbances, hypoxia 5.  Follow-up with Dr. Clayborn Bigness after discharge  This patient's plan of care was discussed and created with Dr. Saralyn Pilar and he is in agreement.    Tristan Schroeder, PA-C 09/21/2022 11:34 AM

## 2022-09-21 NOTE — Progress Notes (Addendum)
Progress Note   Patient: Molly Brown [id number]:[id number] DOB: Sep 03, 1950 DOA: 09/15/2022     6 DOS: the patient was seen and examined on 09/21/2022   Brief hospital course: 72 y.o. 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 Patient presents for evaluation of sudden onset abdominal pain mostly in the epigastrium, periumbilical area.  She rates her pain a 10 x 10 in intensity at its worst.  Pain is nonradiating and is associated with nausea and multiple episodes of emesis.  She states that she has had issues with abdominal pain in the past usually self-limiting but this is the worst pain she has had which prompted her visit to the emergency room.  She is unable to tell me if her prior episodes of pain related to meals.  She has urinary frequency and has dyspnea with exertion which is chronic.  Her last bowel movement was 2 days prior to this admission.  According to the patient she usually has daily bowel movements She denies having any fever, no chills, no chest pain, no headache, no dizziness, no lightheadedness, no cough, no blurred vision no focal deficit. Abnormal labs include lactic acid of 2.0, lipase 539, troponin 24, white count 15.8 CT scan of abdomen and pelvis shows acute interstitial pancreatitis. No evidence of pancreatic necrosis, pancreatic ductal dilation or walled-off collections. Calcifications of the mitral annulus with left atrial dilation, consider further evaluation with cardiac echo if not previously performed. Mild pulmonary edema. Aortic Atherosclerosis. Twelve-lead EKG reviewed by me shows rapid A-fib Patient received 2 L IV fluid bolus, Cardizem 20 mg IV push x 1 and is currently on a Cardizem drip.  1/19.   Tapered off Cardizem drip and on oral metoprolol and oral Cardizem CD.  Will add empiric antibiotics for pancreatitis since procalcitonin elevated and patient does have a white count. 1/20.  Consulted general surgery because I do not have a reason for the pancreatitis.  General surgery ordered for a repeat CAT scan.  Holding Eliquis and converting over to heparin drip just in case surgery needed. 1/21.  Patient pulled out her IV and wanted to go home.  Patient was reoriented and then agreeable to surgery.  General surgery canceled procedure today.  Will advance to full liquid diet. 1/22.  With bilirubin going up I ordered an MRCP.  MRCP showing acute interstitial edematous pancreatitis with peripancreatic fluid collections with some signs of hemorrhagic or proteinaceous change.  Some peripancreatic necrosis seen.  Pancreatic ascites.  Rather striking tapered narrowing of the mid common bile duct with biliary enhancement.  Dilated main pancreatic duct.  Findings may reflect pancreatic divisum or dominant dorsal drainage via minor papilla. 1/23.  I had to advance Dobbhoff tube twice today.  Last x-ray showing in the duodenal bulb.  Okay to start tube feedings.  1/24: further K replacement.  Mental status worse again today.  Tube feeds running and appears to be tolerating okay.  Assessment and Plan: * Severe acute pancreatitis Acute severe pancreatitis with signs of pancreatic necrosis and fluid collections.  Supportive care.  Currently on clear liquid diet.  Dobbhoff tube placed and tube feeds started.   On empiric Zosyn at this point.   General surgery no plan for cholecystectomy IgG4 pending.   The patient does not drink alcohol.  No new medications.  Triglycerides normal.  MRCP shows the possibility of pancreatic divisum which could increase risk of pancreatitis.   Lipase normalized pretty quickly. Clear liquid diet and tube feeds. GI following, appreciate input.  Acute metabolic  encephalopathy Improved after removing IV pain medications. 1/24 recurrent. Possibly due to needing more pain med yesterday --check ammonia level and VBG   Ammonia mildly elevated at 43. --lactulose x 1 day and re-assess mental status. Continue lactulose if needed.  Atrial fibrillation with RVR (HCC) Chronic in nature.  Continue metoprolol and Cardizem CD.  Currently on heparin drip.  Hypokalemia K 3.1 yesterday and today, despite replacement yesterday.   Further K replacement today Monitor BMP  Acute pancreatitis See severe acute pancreatitis  Obesity (BMI 30-39.9) Body mass index is 33.45 kg/m. Complicates overall care and prognosis.  Recommend lifestyle modifications including physical activity and diet for weight loss and overall long-term health.   HOCM (hypertrophic obstructive cardiomyopathy) ([place]) Patient with a history of HOCM Continue metoprolol.   Hypothyroidism Continue Synthroid  GERD (gastroesophageal reflux disease) Continue PPI        Subjective: Patient mental status not as good as yesterday, per RN and family.  She is more lethargic again today.  Husband reports noticing waxing and waning mental status and agrees more confused and sleepy today.  Pt reports feeling rough with abdominal pain, has not been actively vomiting.   Physical Exam: Vitals:   09/20/22 2020 09/21/22 0253 09/21/22 0751 09/21/22 1224  BP: 111/75 (!) 132/95 111/66 97/69  Pulse: (!) 113 (!) 107 92 88  Resp: 16  20 20   Temp: 98.6 F (37 C)  98.2 F (36.8 C) 98.5 F (36.9 C)  TempSrc: Oral  Oral Oral  SpO2: 93% 94% 94% 93%  Weight:      Height:       General exam: somnolent but arousable, no acute distress, obese HEENT: Dobhoff NG tube in place with tube feeds running, eyes closed, dry mucus membranes, hearing grossly normal  Respiratory system: CTAB diminished bases, no wheezes, rales or rhonchi, normal respiratory effort. Cardiovascular system: normal S1/S2, RRR, no  pedal edema.   Gastrointestinal system: soft, epigastric tenderness, ND. Central nervous system:  no gross focal neurologic deficits, normal speech Extremities: moves all, no edema, normal tone Skin: dry, intact, normal temperature Psychiatry: normal mood, congruent affect, judgement and insight appear normal     Data Reviewed: Notable labs --sodium 130, potassium 3.1 unchanged, chloride 96, glucose 139, calcium 8.3, total bili 2.9 improved from 3.7.  Ammonia mildly elevated 43.  CBC within normal limits.  Family Communication: Spoke with husband and daughter at the bedside on rounds  Disposition: Status is: Inpatient Remains inpatient appropriate because: Starting tube feeds for severe pancreatitis with necrosis.  Planned Discharge Destination: Home with Home Health    Time spent: 40 minutes  Author: Ezekiel Slocumb, DO 09/21/2022 3:09 PM  For on call review [email or web address].

## 2022-09-22 DIAGNOSIS — E871 Hypo-osmolality and hyponatremia: Secondary | ICD-10-CM | POA: Clinically undetermined

## 2022-09-22 DIAGNOSIS — K859 Acute pancreatitis without necrosis or infection, unspecified: Secondary | ICD-10-CM | POA: Diagnosis not present

## 2022-09-22 LAB — COMPREHENSIVE METABOLIC PANEL
ALT: 38 U/L (ref 0–44)
AST: 48 U/L — ABNORMAL HIGH (ref 15–41)
Albumin: 2.1 g/dL — ABNORMAL LOW (ref 3.5–5.0)
Alkaline Phosphatase: 86 U/L (ref 38–126)
Anion gap: 5 (ref 5–15)
BUN: 9 mg/dL (ref 8–23)
CO2: 26 mmol/L (ref 22–32)
Calcium: 7.7 mg/dL — ABNORMAL LOW (ref 8.9–10.3)
Chloride: 98 mmol/L (ref 98–111)
Creatinine, Ser: 0.71 mg/dL (ref 0.44–1.00)
GFR, Estimated: >60 mL/min (ref >60)
Glucose, Bld: 164 mg/dL — ABNORMAL HIGH (ref 70–99)
Potassium: 3.6 mmol/L (ref 3.5–5.1)
Sodium: 129 mmol/L — ABNORMAL LOW (ref 135–145)
Total Bilirubin: 1.4 mg/dL — ABNORMAL HIGH (ref 0.3–1.2)
Total Protein: 5.6 g/dL — ABNORMAL LOW (ref 6.5–8.1)

## 2022-09-22 LAB — GLUCOSE, CAPILLARY
Glucose-Capillary: 138 mg/dL — ABNORMAL HIGH (ref 70–99)
Glucose-Capillary: 128 mg/dL — ABNORMAL HIGH (ref 70–99)
Glucose-Capillary: 147 mg/dL — ABNORMAL HIGH (ref 70–99)
Glucose-Capillary: 153 mg/dL — ABNORMAL HIGH (ref 70–99)
Glucose-Capillary: 154 mg/dL — ABNORMAL HIGH (ref 70–99)
Glucose-Capillary: 144 mg/dL — ABNORMAL HIGH (ref 70–99)

## 2022-09-22 LAB — CBC
HCT: 31.4 % — ABNORMAL LOW (ref 36.0–46.0)
Hemoglobin: 10.6 g/dL — ABNORMAL LOW (ref 12.0–15.0)
MCH: 28.9 pg (ref 26.0–34.0)
MCHC: 33.8 g/dL (ref 30.0–36.0)
MCV: 85.6 fL (ref 80.0–100.0)
Platelets: 247 10*3/uL (ref 150–400)
RBC: 3.67 MIL/uL — ABNORMAL LOW (ref 3.87–5.11)
RDW: 14.1 % (ref 11.5–15.5)
WBC: 16.1 10*3/uL — ABNORMAL HIGH (ref 4.0–10.5)
nRBC: 0 % (ref 0.0–0.2)

## 2022-09-22 LAB — HEPARIN LEVEL (UNFRACTIONATED): Heparin Unfractionated: 0.51 IU/mL (ref 0.30–0.70)

## 2022-09-22 LAB — MAGNESIUM: Magnesium: 2 mg/dL (ref 1.7–2.4)

## 2022-09-22 LAB — POTASSIUM: Potassium: 3.7 mmol/L (ref 3.5–5.1)

## 2022-09-22 LAB — PHOSPHORUS: Phosphorus: 1.4 mg/dL — ABNORMAL LOW (ref 2.5–4.6)

## 2022-09-22 MED ORDER — POTASSIUM & SODIUM PHOSPHATES 280-160-250 MG PO PACK
1.0000 | PACK | Freq: Three times a day (TID) | ORAL | Status: AC
  Administered 2022-09-22 (×3): 1 via ORAL
  Filled 2022-09-22 (×3): qty 1

## 2022-09-22 MED ORDER — POTASSIUM & SODIUM PHOSPHATES 280-160-250 MG PO PACK
1.0000 | PACK | Freq: Three times a day (TID) | ORAL | Status: DC
  Administered 2022-09-22: 1
  Filled 2022-09-22 (×2): qty 1

## 2022-09-22 MED ORDER — PIPERACILLIN-TAZOBACTAM 3.375 G IVPB
3.3750 g | Freq: Three times a day (TID) | INTRAVENOUS | Status: DC
  Administered 2022-09-22 – 2022-10-04 (×37): 3.375 g via INTRAVENOUS
  Filled 2022-09-22 (×37): qty 50

## 2022-09-22 MED ORDER — APIXABAN 5 MG PO TABS: 5.0000 mg | ORAL_TABLET | Freq: Two times a day (BID) | ORAL | Status: DC

## 2022-09-22 MED ORDER — HEPARIN (PORCINE) 25000 UT/250ML-% IV SOLN
1550.0000 [IU]/h | INTRAVENOUS | Status: DC
  Administered 2022-09-22 – 2022-09-25 (×7): 1800 [IU]/h via INTRAVENOUS
  Administered 2022-09-26 – 2022-10-01 (×11): 1950 [IU]/h via INTRAVENOUS
  Administered 2022-10-02: 1850 [IU]/h via INTRAVENOUS
  Administered 2022-10-03 – 2022-10-04 (×3): 1550 [IU]/h via INTRAVENOUS
  Filled 2022-09-22 (×21): qty 250

## 2022-09-22 MED ORDER — FUROSEMIDE 10 MG/ML IJ SOLN
40.0000 mg | Freq: Once | INTRAMUSCULAR | Status: AC
  Administered 2022-09-22: 40 mg via INTRAVENOUS
  Filled 2022-09-22: qty 4

## 2022-09-22 MED ORDER — SODIUM CHLORIDE 0.9 % IV SOLN: INTRAVENOUS | Status: DC

## 2022-09-22 NOTE — Progress Notes (Signed)
Nutrition Follow-up  DOCUMENTATION CODES:   Obesity unspecified  INTERVENTION:   -Continue clear liquid diet -Follow for diet advancement and add supplements as appropriate -TF via post-pyloric tube   Vital 1.5 @ 55 ml/hr     30 ml Prosource TF daily.     170 ml free water flush every 4 hours   Tube feeding regimen provides 2040 kcals, 109 grams of protein, and 1008 ml of H2O. Total free water: 2028 ml daily  NUTRITION DIAGNOSIS:   Increased nutrient needs related to acute illness (pancreatitis) as evidenced by estimated needs.  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  1/23- post pyloric feeding tube placed (tip past duodenal bulb per x-ray)   Reviewed I/O's: +4.6 L x 24 hours and +10 L since admission   Case discussed with RN. Pt tolerating TF well and is taking meds by mouth slowly.   Spoke with pt who reports no abdominal pain or vomiting. She is taking sip[s of clear liquids and tolerating meds. Reviewed nutritional plan with pt; pt now receiving goal rate of TF.   Medications reviewed and include vitamin B-12, 0.9% sodium chloride infusion @ 75 ml/hr, and potassium and chloride phosphates.   Labs reviewed: Na: 129, Phos: 1.4, K and Mg WDL. CBGS: 128-153 (inpatient orders for glycemic control are none).    Diet Order:   Diet Order             Diet clear liquid Fluid consistency: Thin  Diet effective now                   EDUCATION NEEDS:   Education needs have been addressed  Skin:  Skin Assessment: Reviewed  RN Assessment  Last BM:  09/21/22 (type 6)  Height:   Ht Readings from Last 1 Encounters:  09/15/22 5\' 8"  (1.727 m)    Weight:   Wt Readings from Last 1 Encounters:  09/15/22 99.8 kg    Ideal Body Weight:  63.6 kg  BMI:  Body mass index is 33.45 kg/m.  Estimated Nutritional Needs:   Kcal:  2050-2250  Protein:  105-120 grams  Fluid:  > 2 L    Loistine Chance, RD, LDN, [place] Registered Dietitian II Certified Diabetes Care and Education Specialist Please refer to [facility] for RD and/or RD on-call/weekend/after hours pager

## 2022-09-22 NOTE — TOC Progression Note (Signed)
Transition of Care [facility]) - Progression Note    Patient Details  Name: Molly Brown MRN: [id number] Date of Birth: May 10, 1951  Transition of Care [facility]) CM/SW [place], [place] Phone Number: 09/22/2022, 11:16 AM  Clinical Narrative:     CSW spoke with patient's family regarding home health recommendation they are in agreement with no preference of agency, [name] with [facility] reports they are able to accept patient pending medical readiness to dc. No dme needs at this time.   TOC will continue to follow.    Expected Discharge Plan: [place] Barriers to Discharge: Continued Medical Work up  Expected Discharge Plan and Services       Living arrangements for the past 2 months: Single Family Home                                       Social Determinants of Health (SDOH) Interventions SDOH Screenings   Food Insecurity: No Food Insecurity (09/16/2022)  Housing: Low Risk  (09/16/2022)  Transportation Needs: No Transportation Needs (09/16/2022)  Utilities: Not At Risk (09/16/2022)  Tobacco Use: Medium Risk (09/16/2022)    Readmission Risk Interventions     No data to display

## 2022-09-22 NOTE — Assessment & Plan Note (Addendum)
Improved with diuresis.  Intermittent IV Lasix PRN, assess daily Monitor BMP and volume status.  1/30: Na 130.  No documented weights and poor in/out charting. Difficult to assess fluid balance, but on exam she appears more dry today.   1/31: Na 131, w no fluids or diuresis --Continue to monitor --If not improving, she may need gentle hydration. Seems clinically more dry with resolved edema she had earlier

## 2022-09-22 NOTE — Progress Notes (Signed)
Progress Note   Patient: Molly Brown [id number] DOB: 14-Dec-1950 DOA: 09/15/2022     7 DOS: the patient was seen and examined on 09/22/2022   Brief hospital course: 72 y.o. 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 Patient presents for evaluation of sudden onset abdominal pain mostly in the epigastrium, periumbilical area.  She rates her pain a 10 x 10 in intensity at its worst.  Pain is nonradiating and is associated with nausea and multiple episodes of emesis.  She states that she has had issues with abdominal pain in the past usually self-limiting but this is the worst pain she has had which prompted her visit to the emergency room.  She is unable to tell me if her prior episodes of pain related to meals.  She has urinary frequency and has dyspnea with exertion which is chronic.  Her last bowel movement was 2 days prior to this admission.  According to the patient she usually has daily bowel movements She denies having any fever, no chills, no chest pain, no headache, no dizziness, no lightheadedness, no cough, no blurred vision no focal deficit. Abnormal labs include lactic acid of 2.0, lipase 539, troponin 24, white count 15.8 CT scan of abdomen and pelvis shows acute interstitial pancreatitis. No evidence of pancreatic necrosis, pancreatic ductal dilation or walled-off collections. Calcifications of the mitral annulus with left atrial dilation, consider further evaluation with cardiac echo if not previously performed. Mild pulmonary edema. Aortic Atherosclerosis. Twelve-lead EKG reviewed by me shows rapid A-fib Patient received 2 L IV fluid bolus, Cardizem 20 mg IV push x 1 and is currently on a Cardizem drip.  1/19.   Tapered off Cardizem drip and on oral metoprolol and oral Cardizem CD.  Started empiric antibiotics for pancreatitis since procalcitonin elevated and leukocytosis. 1/20.  Consulted general surgery with ?gallstone pancreatitis.  Surgery ordered for a repeat CAT scan.  Holding Eliquis and started heparin drip just in case surgery needed. 1/21.  Patient pulled out her IV and wanted to go home.  Patient was reoriented and then agreeable to stay and to surgery if needed.  Advanced diet to full liquids. 1/22.  Bilirubin rising, MRCP showed acute interstitial edematous pancreatitis with peripancreatic fluid collections with some signs of hemorrhagic or proteinaceous change.  Some peripancreatic necrosis seen.  Pancreatic ascites.  Rather striking tapered narrowing of the mid common bile duct with biliary enhancement.  Dilated main pancreatic duct.  Findings may reflect pancreatic divisum or dominant dorsal drainage via minor papilla. 1/23.  Dobbhoff tube had to be advanced twice.  Last x-ray showing in the duodenal bulb.  Okay to start tube feedings.  1/24: further K replacement.  Mental status worse again today.  Tube feeds running and appears to be tolerating okay.  Stopped antibiotics to monitor. 1/25: pt sleeping a lot. Fever 100.4 F last night, recurrent leukocytosis also.  Resumed Zosyn.  Replaced phos.  Sodium declining slowly. Cardiology gave IV Lasix.  Assessment and Plan: * Severe acute pancreatitis Acute severe pancreatitis with signs of pancreatic necrosis and fluid collections.   On empiric Zosyn.   (Stopped Zosyn 1/24 and pt had fever and recurrent leukocytosis that night, resumed 1/25) General surgery no plan for cholecystectomy IgG4 pending.   The patient does not drink alcohol.  No new medications.  Triglycerides normal.  MRCP shows the possibility of pancreatic divisum which could increase risk of pancreatitis.   Lipase normalized pretty quickly. Clear liquid diet and tube feeds. GI  consulted, have signed off. Check Procal with AM labs Low threshold to re-image given fluid collections.  Acute metabolic encephalopathy Improved after removing IV pain medications. 1/24 recurrent. Possibly due to needing more pain med yesterday --check ammonia level and VBG   Ammonia mildly elevated at 43. Given lactulose x 1 day  Continue lactulose if needed. Bowel regimen.  Atrial fibrillation with RVR (HCC) Chronic in nature.  Continue metoprolol and Cardizem CD.  Currently on heparin drip.  Hypophosphatemia Phos 1.4 this AM, replacing with Phos-Nak per tube x 1 day.  Recheck and further replace PRN.  Hyponatremia Na slowly down-trending, today 129. Cardiology gave IV Lasix this AM. Suspect hypervolemia, pt edematous. Monitor BMP and volume status.  Hypokalemia K 3.6 today Monitor BMP  Acute pancreatitis See severe acute pancreatitis  Obesity (BMI 30-39.9) Body mass index is 33.45 kg/m. Complicates overall care and prognosis.  Recommend lifestyle modifications including physical activity and diet for weight loss and overall long-term health.   HOCM (hypertrophic obstructive cardiomyopathy) ([place]) Patient with a history of HOCM Continue metoprolol.   Hypothyroidism Continue Synthroid  GERD (gastroesophageal reflux disease) Continue PPI        Subjective: Patient up in recliner, sister visiting when seen this AM.  Pt very sleepy but does wake up briefly to answer questions.  She is requesting to go back to bed to sleep comfortably, sister reports she's been in the chair 10 minutes.  Therapy had just been in recently.  Pt reports abdominal pain is okay.  Sister reports it was bothering her more earlier before pain medication was given.  Pt denies fever/chills, N/V or other complaints.   Physical Exam: Vitals:   09/21/22 2358 09/22/22 0457 09/22/22 1011 09/22/22 1339  BP: 119/72 (!) 107/58 126/78 111/67  Pulse: 95 (!) 104 (!) 102 81  Resp: (!) 22 16 18 18    Temp: (!) 100.4 F (38 C) 98.8 F (37.1 C) 99.3 F (37.4 C) 98.5 F (36.9 C)  TempSrc:    Oral  SpO2: 97% 93% 97% 96%  Weight:      Height:       General exam: somnolent but arousable, no acute distress, obese HEENT: Dobhoff NG tube in place with tube feeds running, moist mucus membranes, hearing grossly normal  Respiratory system: CTAB diminished bases, no wheezes, rales or rhonchi, normal respiratory effort. Cardiovascular system: normal S1/S2, RRR, no pedal edema.   Gastrointestinal system: distended, mild epigastric tenderness, ND. Central nervous system:  no gross focal neurologic deficits, normal speech Extremities: moves all, no edema, normal tone Skin: dry, intact, normal temperature Psychiatry: normal mood, congruent affect, judgement and insight appear normal     Data Reviewed: Notable labs --sodium 129, glucose 164, calcium 7.7, albumin 2.1, total bili 1.4 improving,  CBC with recurrent leukocytosis 16.1k   Family Communication: Spoke with husband and daughter at the bedside on rounds 1/24.  Patient's sister was at bedside this AM.  Disposition: Status is: Inpatient Remains inpatient appropriate because: Remains on tube feeds.  Persistent electrolyte derangements.  Requiring IV therapies as above.  Pt remains severely ill at this time.   Planned Discharge Destination: Home with Home Health    Time spent: 36 minutes  Author: Ezekiel Slocumb, DO 09/22/2022 5:29 PM  For on call review [email or web address].

## 2022-09-22 NOTE — Consult Note (Signed)
Pharmacy Antibiotic Note  Molly Brown is a 72 y.o. female admitted on 09/15/2022 with  severe acute pancreatitis with signs of pancreatic necrosis and fluid collections . Patient was initiated on empiric antibiotic therapy with Zosyn on 1/19 which was continued thru 1/24. Zosyn was subsequently discontinued given no evidence of infection. However, white count increased 1/25 and patient with low grade fevers and continued abdominal distension. Decision was made to re-start antibiotics. Pharmacy has been consulted for Zosyn dosing.  Plan: Zosyn 3.375g IV q8h (4 hour infusion).  Height: 5\' 8"  (172.7 cm) Weight: 99.8 kg (220 lb) IBW/kg (Calculated) : 63.9  Temp (24hrs), Avg:98.9 F (37.2 C), Min:98.1 F (36.7 C), Max:100.4 F (38 C)  Recent Labs  Lab 09/18/22 0525 09/19/22 0319 09/20/22 0502 09/21/22 0428 09/22/22 0642  WBC 12.0* 10.0 11.1* 9.2 16.1*  CREATININE 0.74 0.65 0.79 0.79 0.71    Estimated Creatinine Clearance: 82 mL/min (by C-G formula based on SCr of 0.71 mg/dL).    Allergies  Allergen Reactions   Eszopiclone Other (See Comments)    Felt really weird   Flecainide Other (See Comments) and Rash    Insomnia and felt drugged    Antimicrobials this admission: Zosyn 1/19 >> 1/24, 1/25 >>  Dose adjustments this admission: N/A  Microbiology results: 1/18 BCx: NG  Thank you for allowing pharmacy to be a part of this patient's care.  Benita Gutter 09/22/2022 8:59 AM

## 2022-09-22 NOTE — Consult Note (Signed)
Standard for IV Heparin Indication: atrial fibrillation  Patient Measurements: Height: 5\' 8"  (172.7 cm) Weight: 99.8 kg (220 lb) IBW/kg (Calculated) : 63.9 Heparin Dosing Weight: 85.8 kg  Labs: Recent Labs    09/20/22 0502 09/20/22 1228 09/21/22 0428 09/22/22 0642  HGB 11.7*  --  13.0 10.6*  HCT 35.4*  --  38.3 31.4*  PLT 259  --  283 247  APTT 80* 77* 63*  --   HEPARINUNFRC 0.62 0.53 0.55 0.51  CREATININE 0.79  --  0.79  --      Estimated Creatinine Clearance: 82 mL/min (by C-G formula based on SCr of 0.79 mg/dL).   Medical History: Past Medical History:  Diagnosis Date   AF (atrial fibrillation) (HCC)    Anal fissure    Anxiety    Cardiac arrhythmia due to congenital heart disease    Chicken pox    Depression    GERD (gastroesophageal reflux disease)    Heart murmur     Medications:  Apixaban 5 mg BID (last dose 09/16/22 at 2251)  Assessment: 72 y/o F with medical history including Afib on apixaban who is admitted with acute pancreatitis. General surgery consulted. Pharmacy consulted to initiate and manage heparin infusion while apixaban is on hold pending surgery evaluation. Heparin was stopped early morning 1/21 @ 0300 for surgery but patient then became not amenable to procedure, pulled out her IV, and wanted to go home. She was reoriented and agreed to surgery, which was subsequently postponed. In order to allow time for new IV placement, per discussion with MD, we opted to give a one-time dose of enoxaparin 1 mg/kg to provide anticoagulation until IV access could be established again for heparin. IV access established again 1/21 @ ~1530.  1/20 1734 aPTT 79 before heparin started. No baseline heparin level ordered prior to giving dose of enoxaparin.  1/22 0816 aPTT 52 HL 0.71 1/22 1615 aPTT 58 1/22 2251 aPTT 51, subtherapeutic 1/23 0502 aPTT 80 therapeutic x 1, HL 0.62 1/23 1228 aPTT 77 HL 0.53 1/24 0428 HL 0.55  therapeutic x 3, aPTT 63 1/25 0642 HL 0.51, therapeutic x 4  Goal of Therapy:  Heparin level 0.3-0.7 units/ml once aPTT and heparin level correlate.  aPTT 66 - 102 seconds Monitor platelets by anticoagulation protocol: Yes  Plan: Will continue heparin infusion at 1800 units/hr. Recheck HL/CBC daily with AM labs while therapeutic.   Renda Rolls, PharmD, [facility] 09/22/2022 7:05 AM

## 2022-09-22 NOTE — Progress Notes (Signed)
[facility] Cardiology  Patient ID: Molly Brown MRN: [id number] DOB/AGE: [birth year] 70 y.o.   Admit date: 09/15/2022 Referring Physician Dr. Francine Graven  Primary Physician  Primary Cardiologist Dr. Nehemiah Massed Reason for Consultation rapid atrial fibrillation  HPI: Molly Brown is a 71YOF with a PMH of HOCM (EF >55% 12/2021), chronic AF on eliquis, OSA, HLD, who presented to [facility] ED 09/15/2022 with sudden onset abdominal pain x 1 day.  She was in AF RVR on presentation, CT abd/pelv concerning for acute interstitial pancreatitis, MRCP without choledocholithiasis, severe necrotizing pancreatitis, no surgical intervention planned.  Cardiology is following the floor atrial fibrillation.  Interval History: -feels ok today, just received a pain pill prior to my arrival. Has some abdominal pain / bloating. Was febrile overnight + leukocytosis 16K today.  - remain in AF with rate mostly controlled in the low 100s-110s - feels a little short of breath, no chest pain or heart racing  Vitals:   09/21/22 2048 09/21/22 2358 09/22/22 0457 09/22/22 1011  BP: 137/86 119/72 (!) 107/58 126/78  Pulse: 91 95 (!) 104 (!) 102  Resp: 18 (!) 22 16 18   Temp: 98.7 F (37.1 C) (!) 100.4 F (38 C) 98.8 F (37.1 C) 99.3 F (37.4 C)  TempSrc: Oral     SpO2: 97% 97% 93% 97%  Weight:      Height:         Intake/Output Summary (Last 24 hours) at 09/22/2022 1236 Last data filed at 09/22/2022 0422 Gross per 24 hour  Intake 4222.09 ml  Output --  Net 4222.09 ml     PHYSICAL EXAM General: ill-appearing Caucasian female, well nourished, in no acute distress.   HEENT:  Normocephalic and atraumatic. NGT in place Neck:   No JVD.  Lungs: somewhat short of breath appearing on room air. Trace crackles left base. Heart: tachycardic Irregularly irregular  Normal S1 and S2 .  Blowing holosystolic murmur heard throughout Abdomen: Mildly-distended appearing.  Msk: Normal strength and tone for age. Extremities: No clubbing, cyanosis.  Generally edematous upper and lower extremities Neuro: Alert and oriented x3, fatigued Psych: Calm and cooperative   LABS: Basic Metabolic Panel: Recent Labs    09/21/22 0428 09/22/22 0642  NA 130* 129*  K 3.1* 3.6  CL 96* 98  CO2 27 26  GLUCOSE 139* 164*  BUN 12 9  CREATININE 0.79 0.71  CALCIUM 8.3* 7.7*  MG 2.1 2.0  PHOS  --  1.4*    Liver Function Tests: Recent Labs    09/20/22 0502 09/21/22 0428 09/22/22 0642  AST 32  --  48*  ALT 23  --  38  ALKPHOS 57  --  86  BILITOT 3.7* 2.9* 1.4*  PROT 6.3*  --  5.6*  ALBUMIN 2.6*  --  2.1*    Recent Labs    09/20/22 0502  LIPASE 28    CBC: Recent Labs    09/21/22 0428 09/22/22 0642  WBC 9.2 16.1*  HGB 13.0 10.6*  HCT 38.3 31.4*  MCV 85.5 85.6  PLT 283 247    Cardiac Enzymes: No results for input(s): "CKTOTAL", "CKMB", "CKMBINDEX", "TROPONINI" in the last 72 hours. BNP: Invalid input(s): "POCBNP" D-Dimer: No results for input(s): "DDIMER" in the last 72 hours. Hemoglobin A1C: No results for input(s): "HGBA1C" in the last 72 hours. Fasting Lipid Panel: No results for input(s): "CHOL", "HDL", "LDLCALC", "TRIG", "CHOLHDL", "LDLDIRECT" in the last 72 hours.  Thyroid Function Tests: No results for input(s): "TSH", "T4TOTAL", "T3FREE", "THYROIDAB" in the last 72  hours.  Invalid input(s): "FREET3" Anemia Panel: No results for input(s): "VITAMINB12", "FOLATE", "FERRITIN", "TIBC", "IRON", "RETICCTPCT" in the last 72 hours.  No results found.   Echo EF>55%, asymmetric septal hypertrophy consistent with hypertrophic obstructive cardiomyopathy, moderate mitral regurgitation  TELEMETRY: Atrial fibrillation 90s - low  100s overnight with increase rate to 120s-140s this morning  ASSESSMENT AND PLAN:  Principal Problem:   Severe acute pancreatitis Active Problems:   GERD (gastroesophageal reflux disease)   Hypothyroidism   Atrial fibrillation with RVR (HCC)   HOCM (hypertrophic obstructive  cardiomyopathy) (HCC)   Obesity (BMI 30-39.9)   Acute metabolic encephalopathy   Sterile pancreatic necrosis   Acute pancreatitis   Hypokalemia    1. Chronic atrial fibrillation, mildly elevated heart rate in the setting of pancreatitis, on Eliquis for stroke prevention, switched to heparin infusion preparation for possible cholecystectomy, s/p diltiazem infusion, now on Cardizem CD 180 mg daily and metoprolol tartrate 50 mg twice daily for rate control. 2.  Acute necrotizing pancreatitis, currently managed conservatively, on IV zosyn, Dobbhoff tube placed 1/23 for enteral feeds 3.  Hypertrophic obstructive cardiomyopathy, asymmetric septal hypertrophy (SWT 2.0 cm ) observed on 2D echocardiogram 12/30/2021.  Euvolemic on room air 1/23   Recommendations   1.  Agree with current therapy 2.  Continue heparin for today and reassess tomorrow before restarting Eliquis. H/H decreased overnight.   3.  Continue Cardizem CD 180CD, continue metoprolol tartrate 50mg  TID 4.  S/p IV Lasix 40 mg x 1 on 1/22, redose lasix 40mg  IV x 1 today and monitor response  5.  Continue to treat causes of increased adrenergic tone including pain, electrolyte disturbances, hypoxia 5.  Follow-up with Dr. Clayborn Bigness after discharge  This patient's plan of care was discussed and created with Dr. Saralyn Pilar and he is in agreement.    Tristan Schroeder, [place] 09/22/2022 12:36 PM

## 2022-09-22 NOTE — Assessment & Plan Note (Addendum)
Phos 3.7 today. Monitor and further replace PRN.

## 2022-09-23 ENCOUNTER — Inpatient Hospital Stay: Payer: BC Managed Care – PPO

## 2022-09-23 ENCOUNTER — Inpatient Hospital Stay: Payer: Self-pay

## 2022-09-23 DIAGNOSIS — K5720 Diverticulitis of large intestine with perforation and abscess without bleeding: Secondary | ICD-10-CM | POA: Diagnosis not present

## 2022-09-23 DIAGNOSIS — K859 Acute pancreatitis without necrosis or infection, unspecified: Secondary | ICD-10-CM | POA: Diagnosis not present

## 2022-09-23 DIAGNOSIS — K5792 Diverticulitis of intestine, part unspecified, without perforation or abscess without bleeding: Secondary | ICD-10-CM | POA: Diagnosis not present

## 2022-09-23 DIAGNOSIS — K5732 Diverticulitis of large intestine without perforation or abscess without bleeding: Secondary | ICD-10-CM | POA: Diagnosis not present

## 2022-09-23 LAB — GLUCOSE, CAPILLARY
Glucose-Capillary: 129 mg/dL — ABNORMAL HIGH (ref 70–99)
Glucose-Capillary: 139 mg/dL — ABNORMAL HIGH (ref 70–99)
Glucose-Capillary: 145 mg/dL — ABNORMAL HIGH (ref 70–99)
Glucose-Capillary: 148 mg/dL — ABNORMAL HIGH (ref 70–99)
Glucose-Capillary: 157 mg/dL — ABNORMAL HIGH (ref 70–99)
Glucose-Capillary: 176 mg/dL — ABNORMAL HIGH (ref 70–99)

## 2022-09-23 LAB — PROCALCITONIN: Procalcitonin: 0.4 ng/mL

## 2022-09-23 LAB — COMPREHENSIVE METABOLIC PANEL
ALT: 39 U/L (ref 0–44)
AST: 42 U/L — ABNORMAL HIGH (ref 15–41)
Albumin: 2.1 g/dL — ABNORMAL LOW (ref 3.5–5.0)
Alkaline Phosphatase: 82 U/L (ref 38–126)
Anion gap: 7 (ref 5–15)
BUN: 12 mg/dL (ref 8–23)
CO2: 27 mmol/L (ref 22–32)
Calcium: 7.8 mg/dL — ABNORMAL LOW (ref 8.9–10.3)
Chloride: 97 mmol/L — ABNORMAL LOW (ref 98–111)
Creatinine, Ser: 0.75 mg/dL (ref 0.44–1.00)
GFR, Estimated: >60 mL/min (ref >60)
Glucose, Bld: 155 mg/dL — ABNORMAL HIGH (ref 70–99)
Potassium: 3.5 mmol/L (ref 3.5–5.1)
Sodium: 131 mmol/L — ABNORMAL LOW (ref 135–145)
Total Bilirubin: 1.1 mg/dL (ref 0.3–1.2)
Total Protein: 5.6 g/dL — ABNORMAL LOW (ref 6.5–8.1)

## 2022-09-23 LAB — CBC
HCT: 30.8 % — ABNORMAL LOW (ref 36.0–46.0)
Hemoglobin: 10.4 g/dL — ABNORMAL LOW (ref 12.0–15.0)
MCH: 29.1 pg (ref 26.0–34.0)
MCHC: 33.8 g/dL (ref 30.0–36.0)
MCV: 86 fL (ref 80.0–100.0)
Platelets: 267 10*3/uL (ref 150–400)
RBC: 3.58 MIL/uL — ABNORMAL LOW (ref 3.87–5.11)
RDW: 14.5 % (ref 11.5–15.5)
WBC: 18.7 10*3/uL — ABNORMAL HIGH (ref 4.0–10.5)
nRBC: 0 % (ref 0.0–0.2)

## 2022-09-23 LAB — PHOSPHORUS: Phosphorus: 2 mg/dL — ABNORMAL LOW (ref 2.5–4.6)

## 2022-09-23 LAB — HEPARIN LEVEL (UNFRACTIONATED): Heparin Unfractionated: 0.35 IU/mL (ref 0.30–0.70)

## 2022-09-23 MED ORDER — IOHEXOL 300 MG/ML  SOLN
100.0000 mL | Freq: Once | INTRAMUSCULAR | Status: AC | PRN
  Administered 2022-09-23: 100 mL via INTRAVENOUS

## 2022-09-23 MED ORDER — FUROSEMIDE 10 MG/ML IJ SOLN
40.0000 mg | Freq: Once | INTRAMUSCULAR | Status: AC
  Administered 2022-09-23: 40 mg via INTRAVENOUS
  Filled 2022-09-23: qty 4

## 2022-09-23 MED ORDER — INSULIN ASPART 100 UNIT/ML IJ SOLN
0.0000 [IU] | Freq: Four times a day (QID) | INTRAMUSCULAR | Status: DC
  Administered 2022-09-24: 3 [IU] via SUBCUTANEOUS
  Administered 2022-09-24 – 2022-09-25 (×5): 2 [IU] via SUBCUTANEOUS
  Administered 2022-09-25 – 2022-09-26 (×4): 3 [IU] via SUBCUTANEOUS
  Administered 2022-09-26: 2 [IU] via SUBCUTANEOUS
  Administered 2022-09-26 – 2022-09-28 (×6): 3 [IU] via SUBCUTANEOUS
  Administered 2022-09-28 – 2022-09-29 (×5): 5 [IU] via SUBCUTANEOUS
  Filled 2022-09-23 (×23): qty 1

## 2022-09-23 MED ORDER — POTASSIUM PHOSPHATES 15 MMOLE/5ML IV SOLN
20.0000 mmol | Freq: Once | INTRAVENOUS | Status: AC
  Administered 2022-09-23: 20 mmol via INTRAVENOUS
  Filled 2022-09-23: qty 6.67

## 2022-09-23 MED ORDER — METOPROLOL TARTRATE 50 MG PO TABS
50.0000 mg | ORAL_TABLET | Freq: Two times a day (BID) | ORAL | Status: DC
  Administered 2022-09-23 – 2022-10-05 (×23): 50 mg via ORAL
  Filled 2022-09-23 (×23): qty 1

## 2022-09-23 MED ORDER — POTASSIUM CHLORIDE 20 MEQ PO PACK
40.0000 meq | PACK | Freq: Once | ORAL | Status: AC
  Administered 2022-09-23: 40 meq via ORAL
  Filled 2022-09-23: qty 2

## 2022-09-23 MED ORDER — TRAVASOL 10 % IV SOLN
INTRAVENOUS | Status: AC
  Filled 2022-09-23: qty 537.6

## 2022-09-23 NOTE — Consult Note (Signed)
ANTICOAGULATION CONSULT NOTE  Pharmacy Consult for IV Heparin Indication: atrial fibrillation  Patient Measurements: Height: 5\' 8"  (172.7 cm) Weight: 99.8 kg (220 lb) IBW/kg (Calculated) : 63.9 Heparin Dosing Weight: 85.8 kg  Labs: Recent Labs    09/20/22 1228 09/20/22 1228 09/21/22 0428 09/22/22 0642 09/23/22 0705  HGB  --    < > 13.0 10.6* 10.4*  HCT  --   --  38.3 31.4* 30.8*  PLT  --   --  283 247 267  APTT 77*  --  63*  --   --   HEPARINUNFRC 0.53  --  0.55 0.51 0.35  CREATININE  --   --  0.79 0.71 0.75   < > = values in this interval not displayed.    Estimated Creatinine Clearance: 82 mL/min (by C-G formula based on SCr of 0.75 mg/dL).  Medical History: Past Medical History:  Diagnosis Date   AF (atrial fibrillation) (HCC)    Anal fissure    Anxiety    Cardiac arrhythmia due to congenital heart disease    Chicken pox    Depression    GERD (gastroesophageal reflux disease)    Heart murmur     Medications:  Apixaban 5 mg BID (last dose 09/16/22 at 2251)  Assessment: 72 y/o F with medical history including Afib on apixaban who is admitted with acute pancreatitis. General surgery consulted. Pharmacy consulted to initiate and manage heparin infusion while apixaban is on hold pending surgery evaluation. Heparin was stopped early morning 1/21 @ 0300 for surgery but patient then became not amenable to procedure, pulled out her IV, and wanted to go home. She was reoriented and agreed to surgery, which was subsequently postponed. In order to allow time for new IV placement, per discussion with MD, we opted to give a one-time dose of enoxaparin 1 mg/kg to provide anticoagulation until IV access could be established again for heparin. IV access established again 1/21 @ ~1530.  1/20 1734 aPTT 79 before heparin started. No baseline heparin level ordered prior to giving dose of enoxaparin.  1/22 0816 aPTT 52 HL 0.71 1/22 1615 aPTT 58 1/22 2251 aPTT 51, subtherapeutic 1/23  0502 aPTT 80 therapeutic x 1, HL 0.62 1/23 1228 aPTT 77 HL 0.53 1/24 0428 HL 0.55 therapeutic x 3, aPTT 63 1/25 0642 HL 0.51, therapeutic x 4 1/26 0705 HL 0.35, therapeutic x 5  Goal of Therapy:  Heparin level 0.3-0.7 units/ml Monitor platelets by anticoagulation protocol: Yes  Plan: --Heparin level continues to be therapeutic --Continue heparin infusion at 1800 units/hr --Re-check HL tomorrow AM --Daily CBC per protocol while on IV heparin --Follow-up transition back to home apixaban when appropriate  Benita Gutter 09/23/2022 8:07 AM

## 2022-09-23 NOTE — Progress Notes (Signed)
Physical Therapy Treatment Patient Details Name: Molly Brown MRN: [id number] DOB: 08-02-1951 Today's Date: 09/23/2022   History of Present Illness Pt is a 72 y.o. 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  MD assessment includes: Acute metabolic encephalopathy, hypokalemia, and hypertrophic obstructive cardiomyopathy.    PT Comments    Pt found sleeping, awakened with min verbal and tactile cues.  Pt lethargic during the session and declined all but below supine therex with education provided on physiological benefits of activity.  Pt ended up putting forth good effort with below therex but again declined mobility training.  Pt will benefit from HHPT upon discharge to safely address deficits listed in patient problem list for decreased caregiver assistance and eventual return to PLOF.    Recommendations for follow up therapy are one component of a multi-disciplinary discharge planning process, led by the attending physician.  Recommendations may be updated based on patient status, additional functional criteria and insurance authorization.  Follow Up Recommendations  Home health PT     Assistance Recommended at Discharge Frequent or constant Supervision/Assistance  Patient can return home with the following A little help with walking and/or transfers;A little help with bathing/dressing/bathroom;Assistance with cooking/housework;Assist for transportation;Help with stairs or ramp for entrance   Equipment Recommendations  None recommended by PT    Recommendations for Other Services       Precautions / Restrictions Precautions Precautions: Fall Restrictions Weight Bearing Restrictions: No Other Position/Activity Restrictions: HOB to >/= 30 deg     Mobility  Bed Mobility                General bed mobility comments: Pt declined OOB activity secondary to fatigue, agreed to below supine therex only    Transfers                        Ambulation/Gait                   Stairs             Wheelchair Mobility    Modified Rankin (Stroke Patients Only)       Balance                                            Cognition Arousal/Alertness: Lethargic Behavior During Therapy: WFL for tasks assessed/performed Overall Cognitive Status: Within Functional Limits for tasks assessed                                          Exercises Total Joint Exercises Ankle Circles/Pumps: Strengthening, Both, 10 reps (with manual resistance) Quad Sets: Strengthening, Both, 10 reps Heel Slides: Strengthening, Both, 10 reps Hip ABduction/ADduction: Strengthening, Both, 10 reps (with manual resistance) Straight Leg Raises: Strengthening, Both, 5 reps Bridges: Strengthening, Both, 10 reps Other Exercises Other Exercises: Pt education provided on physiological benefits of activity    General Comments        Pertinent Vitals/Pain Pain Assessment Pain Assessment: No/denies pain    Home Living  Prior Function            PT Goals (current goals can now be found in the care plan section) Progress towards PT goals: PT to reassess next treatment    Frequency    Min 2X/week      PT Plan Current plan remains appropriate    Co-evaluation              AM-PAC PT "6 Clicks" Mobility   Outcome Measure  Help needed turning from your back to your side while in a flat bed without using bedrails?: A Little Help needed moving from lying on your back to sitting on the side of a flat bed without using bedrails?: A Little Help needed moving to and from a bed to a chair (including a wheelchair)?: A Little Help needed standing up from a chair using your arms (e.g., wheelchair or  bedside chair)?: A Little Help needed to walk in hospital room?: A Little Help needed climbing 3-5 steps with a railing? : A Lot 6 Click Score: 17    End of Session   Activity Tolerance: Patient tolerated treatment well Patient left: in bed;with call bell/phone within reach;with bed alarm set;with family/visitor present Nurse Communication: Mobility status PT Visit Diagnosis: Muscle weakness (generalized) (M62.81);Difficulty in walking, not elsewhere classified (R26.2);Pain     Time: 8315-1761 PT Time Calculation (min) (ACUTE ONLY): 18 min  Charges:  $Therapeutic Exercise: 8-22 mins                     D. Royetta Asal PT, DPT 09/23/22, 2:33 PM

## 2022-09-23 NOTE — Progress Notes (Signed)
Progress Note   Patient: Molly Brown EXB:[id number] DOB: [birth year] DOA: 09/15/2022     8 DOS: the patient was seen and examined on 09/23/2022   Brief hospital course: 72 y.o. 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 Patient presents for evaluation of sudden onset abdominal pain mostly in the epigastrium, periumbilical area.  She rates her pain a 10 x 10 in intensity at its worst.  Pain is nonradiating and is associated with nausea and multiple episodes of emesis.  She states that she has had issues with abdominal pain in the past usually self-limiting but this is the worst pain she has had which prompted her visit to the emergency room.  She is unable to tell me if her prior episodes of pain related to meals.  She has urinary frequency and has dyspnea with exertion which is chronic.  Her last bowel movement was 2 days prior to this admission.  According to the patient she usually has daily bowel movements She denies having any fever, no chills, no chest pain, no headache, no dizziness, no lightheadedness, no cough, no blurred vision no focal deficit. Abnormal labs include lactic acid of 2.0, lipase 539, troponin 24, white count 15.8 CT scan of abdomen and pelvis shows acute interstitial pancreatitis. No evidence of pancreatic necrosis, pancreatic ductal dilation or walled-off collections. Calcifications of the mitral annulus with left atrial dilation, consider further evaluation with cardiac echo if not previously performed. Mild pulmonary edema. Aortic Atherosclerosis. Twelve-lead EKG reviewed by me shows rapid A-fib Patient received 2 L IV fluid bolus, Cardizem 20 mg IV push x 1 and is currently on a Cardizem drip.  1/19.   Tapered off Cardizem drip and on oral metoprolol and oral Cardizem CD.  Started empiric antibiotics for pancreatitis since procalcitonin elevated and leukocytosis. 1/20.  Consulted general surgery with ?gallstone pancreatitis.  Surgery ordered for a repeat CAT scan.  Holding Eliquis and started heparin drip just in case surgery needed. 1/21.  Patient pulled out her IV and wanted to go home.  Patient was reoriented and then agreeable to stay and to surgery if needed.  Advanced diet to full liquids. 1/22.  Bilirubin rising, MRCP showed acute interstitial edematous pancreatitis with peripancreatic fluid collections with some signs of hemorrhagic or proteinaceous change.  Some peripancreatic necrosis seen.  Pancreatic ascites.  Rather striking tapered narrowing of the mid common bile duct with biliary enhancement.  Dilated main pancreatic duct.  Findings may reflect pancreatic divisum or dominant dorsal drainage via minor papilla. 1/23.  Dobbhoff tube had to be advanced twice.  Last x-ray showing in the duodenal bulb.  Okay to start tube feedings.  1/24: further K replacement.  Mental status worse again today.  Tube feeds running and appears to be tolerating okay.  Stopped antibiotics to monitor. 1/25: pt sleeping a lot. Fever 100.4 F last night, recurrent leukocytosis also.  Resumed Zosyn.  Replaced phos.  Sodium declining slowly. Cardiology gave IV Lasix. 1/26: repeated CT, has more pancreatic fluid collections but no necrosis. New finding of acute diverticulitis with contained perforation. Surgery re-consulted.  Starting TPN and continuing antibiotics  Assessment and Plan: * Severe acute pancreatitis Acute severe pancreatitis with increased fluid collections since prior CT but no evidence of necrosis on repeat CT (1/26).   On  empiric Zosyn - continuing for diverticulitis. General surgery no plan for cholecystectomy IgG4 pending.   The patient does not drink alcohol.  No new medications.   Triglycerides normal.  MRCP shows the possibility of pancreatic divisum which could increase risk of pancreatitis.   Lipase normalized pretty quickly. GI consulted, have signed off. Stopping tube feeds today and starting TPN due to acute diverticulitis.  Acute metabolic encephalopathy Improved after removing IV pain medications. 1/24 recurrent. Possibly due to needing more pain med yesterday --check ammonia level and VBG   Ammonia mildly elevated at 43. Given lactulose x 1 day  Continue lactulose if needed. Bowel regimen.  Atrial fibrillation with RVR (HCC) Chronic in nature.  Continue metoprolol and Cardizem CD.  Currently on heparin drip.  Acute diverticulitis Repeated CT today 1/26 to re-assess pancreatitis and patient now has sigmoid diverticulitis with contained perforation.  She has point tenderness of LLQ on exam, no peritoneal signs --General surgery re-consulted --Stop tube feeds --Starting TPN --Continue Zosyn --NPO ex sips w meds or to wet mouth --Pain control, antiemetics PRN   Hypophosphatemia Phos 2.0 this AM, replacing. Monitor and further replace PRN.  Hyponatremia Na slowly down-trending, today 129. Cardiology gave IV Lasix this AM. Suspect hypervolemia, pt edematous. Monitor BMP and volume status.  Hypokalemia K 3.6 today Monitor BMP  Acute pancreatitis See severe acute pancreatitis  Obesity (BMI 30-39.9) Body mass index is 33.45 kg/m. Complicates overall care and prognosis.  Recommend lifestyle modifications including physical activity and diet for weight loss and overall long-term health.   HOCM (hypertrophic obstructive cardiomyopathy) ([place]) Patient with a history of HOCM Continue metoprolol.   Hypothyroidism Continue Synthroid  GERD (gastroesophageal reflux disease) Continue PPI        Subjective: Patient awake resting in bed, sister at bedside.  Pt reports LLQ pain and tenderness, no fever/chills or nausea/vomiting.  She is  more awake and talkative for me this AM that past couple of days.  We discussed CT results and re-consulting surgery.   Physical Exam: Vitals:   09/23/22 1400 09/23/22 1500 09/23/22 1550 09/23/22 1553  BP:    107/65  Pulse:    (!) 106  Resp: (!) 21 (!) 22 20 18   Temp:    98.1 F (36.7 C)  TempSrc:    Oral  SpO2:    94%  Weight:      Height:       General exam: awake, more alert & talkative, no acute distress, obese HEENT: Dobhoff NG tube in place with tube feeds running, moist mucus membranes, hearing grossly normal  Respiratory system: CTAB diminished bases, no wheezes, rales or rhonchi, normal respiratory effort. Cardiovascular system: normal S1/S2, RRR, no pedal edema.   Gastrointestinal system: distended, LLQ tenderness, no rebound tenderness or peritoneal signs Central nervous system:  no gross focal neurologic deficits, normal speech Extremities: moves all, no edema, normal tone Skin: dry, intact, normal temperature Psychiatry: normal mood, congruent affect, judgement and insight appear normal     Data Reviewed: Notable labs -- Na 129 >> 131, Cl 97, glucose 155, Ca 7.8, Phos 2.0, albumin 2.1, AST 42, Tprotein 5.6. Tbili normalized 1.1.  WBC 18.7k from 16.1 yesterday, Hbg stable 10.4  Repeat CT abdomen/pelvis with IV contrast this AM --- new sigmoid diverticulitis and more pancreatic fluid collections, but none are rim enhancing, no evidence of necrosis. IMPRESSION: "1. Interval changes of acute diverticulitis involving the proximal sigmoid colon with a contained perforation at this level containing fluid and gas laterally, measuring  3.1 x 1.6 x 2.4 cm. There is an adjacent tiny collection of extraluminal gas. 2. Increased peripancreatic edema and fluid including multiple developing fluid collections without surrounding rim enhancement at this time and no evidence of pancreatic necrosis. 3. Small bilateral pleural effusions, decreased on the right and increased on the  left. 4. Small amount of free peritoneal fluid, decreased. 5. Marked left atrial enlargement.  "   Family Communication: Spoke with husband and daughter at the bedside on rounds 1/24.  Patient's sister was at bedside this AM.  Disposition: Status is: Inpatient Remains inpatient appropriate because: Remains on tube feeds.  Persistent electrolyte derangements.  Requiring IV therapies as above.  Pt remains severely ill at this time.   Planned Discharge Destination: Home with Home Health    Time spent: 36 minutes  Author: Pennie Banter, DO 09/23/2022 4:33 PM  For on call review [email or web address].

## 2022-09-23 NOTE — Consult Note (Addendum)
PHARMACY - TOTAL PARENTERAL NUTRITION CONSULT NOTE   Indication: Prolonged ileus  Patient Measurements: Height: 5\' 8"  (172.7 cm) Weight: 99.8 kg (220 lb) IBW/kg (Calculated) : 63.9 TPN AdjBW (KG): 72.9 Body mass index is 33.45 kg/m.  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 Patient subsequently admitted for acute pancreatitis. Hospital course complicated by development of diverticulitis with contained perforation. Enteric feedings placed on hold. Pharmacy consulted to initiate and manage TPN for prolonged ileus.   Glucose / Insulin:  --Hgb A1c on 05/31/22 was 6% (pre-diabetes) --Patient has been mildly hyperglycemic on tube feeds but within goal range Electrolytes:  --Hyponatremia, hypochloremia, hypophosphatemia Renal:  --Scr < 1 Hepatic:  --Overall within normal limits, hyperbilirubinemia resolved Intake / Output; MIVF:  --I&Os have not been charted strictly --No MIVF GI Imaging: 1/26 CT abdomen / pelvis: Interval changes of acute diverticulitis involving the proximal sigmoid colon with a contained perforation. Increased peripancreatic edema and fluid including multiple developing fluid collections without surrounding rim enhancement at this time and no evidence of pancreatic necrosis. GI Surgeries / Procedures: N/A  Central access: PICC placed 1/26 TPN start date: 1/26  Nutritional Goals: Goal TPN rate is 80 mL/hr (provides 107 g of protein and 2050 kcals per day)  RD Assessment: Estimated Needs Total Energy Estimated Needs: 2050-2250 Total Protein Estimated Needs: 105-120 grams Total Fluid Estimated Needs: > 2 L  Current Nutrition:  TPN Tube feed stopped 1/26  Plan:  --Start TPN at 40 mL/hr at 1800 Per discussion with RD, low risk for re-feeding, anticipate will be able to advance to goal rate 1/27 if no contraindications --Electrolytes in TPN: Na  40mEq/L, K 60mEq/L, Ca 70mEq/L, Mg 81mEq/L, and Phos 64mmol/L. Cl:Ac 1:1 Give potassium phosphate 20 mmol IV x 1 --Add standard MVI and trace elements to TPN, thiamine 100 mg x 3 days (day #1) --Initiate Moderate q6h SSI and adjust as needed  --Monitor TPN labs on Mon/Thurs, daily until stable  Archita Lomeli B Donney Rankins 09/23/2022,11:58 AM

## 2022-09-23 NOTE — Progress Notes (Signed)
[facility] Cardiology  Patient ID: Molly Brown MRN: [id number] DOB/AGE: September 25, 1950 72 y.o.   Admit date: 09/15/2022 Referring Physician Dr. Francine Graven  Primary Physician  Primary Cardiologist Dr. Nehemiah Massed Reason for Consultation rapid atrial fibrillation  HPI: Molly Brown is a 71YOF with a PMH of HOCM (EF >55% 12/2021), chronic AF on eliquis, OSA, HLD, who presented to [facility] ED 09/15/2022 with sudden onset abdominal pain x 1 day.  She was in AF RVR on presentation, CT abd/pelv concerning for acute interstitial pancreatitis, MRCP without choledocholithiasis, severe necrotizing pancreatitis, no surgical intervention planned.  Cardiology is following the for atrial fibrillation.  Interval History: -feels stronger today, abdominal pain manageable. Feels short of breath / weak but is not hypoxic. Didn't notice much change in work of breathing or peripheral edema with lasix yesterday. No chest pain or heart racing. - remain in AF with rate mostly controlled in the low 100s-110s - worsening leukocytosis from 16K -- 18.7K. afebrile  Vitals:   09/22/22 2353 09/23/22 0356 09/23/22 0522 09/23/22 0736  BP: 101/64 114/73  138/69  Pulse: 98 91  95  Resp: 20 18  18   Temp: 97.7 F (36.5 C) 98.7 F (37.1 C) 98.4 F (36.9 C) 98.4 F (36.9 C)  TempSrc:  Oral    SpO2: 94% 96%  97%  Weight:      Height:         Intake/Output Summary (Last 24 hours) at 09/23/2022 0759 Last data filed at 09/23/2022 0400 Gross per 24 hour  Intake 697.14 ml  Output 1200 ml  Net -502.86 ml     PHYSICAL EXAM General: ill-appearing Caucasian female,  in no acute distress. Sitting upright in bed with husband at bedside HEENT:  Normocephalic and atraumatic. NGT in place Neck:   No JVD.  Lungs: Normal work of breathing on room air. Trace crackles left base. Heart: tachycardic Irregularly irregular  Normal S1 and S2 .  Blowing holosystolic murmur heard throughout Abdomen: Mildly-distended appearing.  Msk: Normal strength and  tone for age. Extremities: No clubbing, cyanosis. Generally edematous upper and lower extremities Neuro: Alert and oriented x3,  Psych: Calm and cooperative   LABS: Basic Metabolic Panel: Recent Labs    09/21/22 0428 09/22/22 0642 09/22/22 1528 09/23/22 0705  NA 130* 129*  --  131*  K 3.1* 3.6 3.7 3.5  CL 96* 98  --  97*  CO2 27 26  --  27  GLUCOSE 139* 164*  --  155*  BUN 12 9  --  12  CREATININE 0.79 0.71  --  0.75  CALCIUM 8.3* 7.7*  --  7.8*  MG 2.1 2.0  --   --   PHOS  --  1.4*  --  2.0*    Liver Function Tests: Recent Labs    09/22/22 0642 09/23/22 0705  AST 48* 42*  ALT 38 39  ALKPHOS 86 82  BILITOT 1.4* 1.1  PROT 5.6* 5.6*  ALBUMIN 2.1* 2.1*    No results for input(s): "LIPASE", "AMYLASE" in the last 72 hours.  CBC: Recent Labs    09/22/22 0642 09/23/22 0705  WBC 16.1* 18.7*  HGB 10.6* 10.4*  HCT 31.4* 30.8*  MCV 85.6 86.0  PLT 247 267    Cardiac Enzymes: No results for input(s): "CKTOTAL", "CKMB", "CKMBINDEX", "TROPONINI" in the last 72 hours. BNP: Invalid input(s): "POCBNP" D-Dimer: No results for input(s): "DDIMER" in the last 72 hours. Hemoglobin A1C: No results for input(s): "HGBA1C" in the last 72 hours. Fasting Lipid Panel: No  results for input(s): "CHOL", "HDL", "LDLCALC", "TRIG", "CHOLHDL", "LDLDIRECT" in the last 72 hours.  Thyroid Function Tests: No results for input(s): "TSH", "T4TOTAL", "T3FREE", "THYROIDAB" in the last 72 hours.  Invalid input(s): "FREET3" Anemia Panel: No results for input(s): "VITAMINB12", "FOLATE", "FERRITIN", "TIBC", "IRON", "RETICCTPCT" in the last 72 hours.  No results found.   Echo EF>55%, asymmetric septal hypertrophy consistent with hypertrophic obstructive cardiomyopathy, moderate mitral regurgitation  TELEMETRY: Atrial fibrillation 90s - low  100s   ASSESSMENT AND PLAN:  Principal Problem:   Severe acute pancreatitis Active Problems:   GERD (gastroesophageal reflux disease)    Hypothyroidism   Atrial fibrillation with RVR (HCC)   HOCM (hypertrophic obstructive cardiomyopathy) (HCC)   Obesity (BMI 30-39.9)   Acute metabolic encephalopathy   Sterile pancreatic necrosis   Acute pancreatitis   Hypokalemia   Hyponatremia   Hypophosphatemia    1. Chronic atrial fibrillation, mildly elevated heart rate in the setting of pancreatitis, on Eliquis for stroke prevention, switched to heparin infusion preparation for possible cholecystectomy, s/p diltiazem infusion, now on Cardizem CD 180 mg daily and metoprolol tartrate 50 mg twice daily for rate control. 2.  Acute necrotizing pancreatitis, currently managed conservatively, on IV zosyn, Dobbhoff tube placed 1/23 for enteral feeds 3.  Hypertrophic obstructive cardiomyopathy, asymmetric septal hypertrophy (SWT 2.0 cm ) observed on 2D echocardiogram 12/30/2021.  Euvolemic on room air 1/26   Recommendations   1.  Agree with current therapy 2.  Continue heparin now. Restart Eliquis  5mg  BID once certain no need for surgery / at discharge.  3.  Continue Cardizem CD 180CD, decrease frequency of metoprolol tartrate 50mg  from TID to BID  4.  S/p IV Lasix 40 mg x 1 on 1/22, 1/25. Hold further dosing for now 5.  Continue to treat causes of increased adrenergic tone including pain, electrolyte disturbances, hypoxia, fever,, etc 5.  Follow-up with Dr. Clayborn Bigness after discharge  This patient's plan of care was discussed and created with Dr. Clayborn Bigness and he is in agreement.    Tristan Schroeder, PA-C 09/23/2022 7:59 AM

## 2022-09-23 NOTE — Progress Notes (Signed)
[facility] SURGICAL PROGRESS NOTE (cpt [phone])  Hospital Day(s): 8.   Post op day(s): 5 Days Post-Op.   Interval History:  Patient seen previously this admission for necrotizing pancreatitis but had done reasonably well and surgery had signed off.   We are being asked to see again today secondary to patient developing fever to 100.6F in the last 24-48 hours with recurrent leukocytosis. She underwent repeat CT Abdomen/Pelvis this morning which was concerning for diverticulitis with small contained perforation. Most recent labs show a leukocytosis to 18.7K (up from 16.1K yesterday), renal function normal with sCr - 0.75, and mild hypophosphatemia to 2.0. She is currently on post-pyloric enteric feedings via Dobbhoff. Currently on Zosyn.   Review of Systems:  Constitutional: denies fever, chills  HEENT: denies cough or congestion  Respiratory: denies any shortness of breath  Cardiovascular: denies chest pain or palpitations  Gastrointestinal: + abdominal pain, denied N/V Genitourinary: denies burning with urination or urinary frequency Musculoskeletal: denies pain, decreased motor or sensation  Vital signs in last 24 hours: [min-max] current  Temp:  [97.7 F (36.5 C)-98.7 F (37.1 C)] 98.4 F (36.9 C) (01/26 0736) Pulse Rate:  [81-102] 95 (01/26 0736) Resp:  [18-20] 19 (01/26 0851) BP: (94-138)/(62-73) 138/69 (01/26 0736) SpO2:  [94 %-97 %] 97 % (01/26 0736)     Height: 5\' 8"  (172.7 cm) Weight: 99.8 kg BMI (Calculated): 33.46   Intake/Output last 2 shifts:  01/25 0701 - 01/26 0700 In: 697.1 [P.O.:240; I.V.:162; NG/GT:295.2] Out: 1200 [Urine:1200]   Physical Exam:  Constitutional: alert, cooperative and no distress  HENT: normocephalic without obvious abnormality, Dobbhoff in place Eyes: PERRL, EOM's grossly intact and symmetric  Respiratory: breathing non-labored at rest  Cardiovascular: regular rate and sinus rhythm  Gastrointestinal: soft, she is point tender  in LLQ, non-distended, no rebound/guarding. She is not overtly peritonitic nor toxic  Musculoskeletal: no edema or wounds, motor and sensation grossly intact, NT    Labs:     Latest Ref Rng & Units 09/23/2022    7:05 AM 09/22/2022    6:42 AM 09/21/2022    4:28 AM  CBC  WBC 4.0 - 10.5 K/uL 18.7  16.1  9.2   Hemoglobin 12.0 - 15.0 g/dL 10.4  10.6  13.0   Hematocrit 36.0 - 46.0 % 30.8  31.4  38.3   Platelets 150 - 400 K/uL 267  247  283       Latest Ref Rng & Units 09/23/2022    7:05 AM 09/22/2022    3:28 PM 09/22/2022    6:42 AM  CMP  Glucose 70 - 99 mg/dL 155   164   BUN 8 - 23 mg/dL 12   9   Creatinine 0.44 - 1.00 mg/dL 0.75   0.71   Sodium 135 - 145 mmol/L 131   129   Potassium 3.5 - 5.1 mmol/L 3.5  3.7  3.6   Chloride 98 - 111 mmol/L 97   98   CO2 22 - 32 mmol/L 27   26   Calcium 8.9 - 10.3 mg/dL 7.8   7.7   Total Protein 6.5 - 8.1 g/dL 5.6   5.6   Total Bilirubin 0.3 - 1.2 mg/dL 1.1   1.4   Alkaline Phos 38 - 126 U/L 82   86   AST 15 - 41 U/L 42   48   ALT 0 - 44 U/L 39   38      Imaging studies:   CT Abdomen/pelvis (09/23/2022)  personally reviewed which shows new stranding surrounding the descending/sigmoid colon, there does appear to be small contained perforation, no massive pneumoperitoneum, known pancreatic process appreciated, and radiologist report reviewed below;  IMPRESSION: 1. Interval changes of acute diverticulitis involving the proximal sigmoid colon with a contained perforation at this level containing fluid and gas laterally, measuring 3.1 x 1.6 x 2.4 cm. There is an adjacent tiny collection of extraluminal gas. 2. Increased peripancreatic edema and fluid including multiple developing fluid collections without surrounding rim enhancement at this time and no evidence of pancreatic necrosis. 3. Small bilateral pleural effusions, decreased on the right and increased on the left. 4. Small amount of free peritoneal fluid, decreased. 5. Marked left atrial  enlargement.    Assessment/Plan: (ICD-10's: K32.32) 72 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   - Unfortunately, will need to hold enteric feeding given new findings of sigmoid diverticulitis. Will consult for PICC and TPN for nutrition anticipating prolonged NPO status throughout the weekend. Okay for sips with meds; water/ice for comfort - Agree with continuation of IV Abx (Zosyn) - No need for emergent surgical intervention currently. She understands that should she fail to improve or clinically deteriorate, we may need to consider intervention which would likely result in temporizing colostomy.  - May consider repeat imaging in 72 hours pending clinical condition; Current air fluid collection appears too small for percutaneous drainage    - Monitor abdominal examination; on-going bowel function - Monitor leukocytosis - Pain control prn; antiemetics prn  - Further management per primary service; we will follow    All of the above findings and recommendations were discussed with the patient, patient's family, and the medical team, and all of patient's and family's questions were answered to their expressed satisfaction.  -- Edison Simon, PA-C [facility] 09/23/2022, 11:59 AM M-F: 7am - 4pm

## 2022-09-23 NOTE — Progress Notes (Signed)
Nutrition Follow-up  DOCUMENTATION CODES:   Obesity unspecified  INTERVENTION:   -TF currently on hold secondary to diverticulitis with perforation -TPN management per pharmacy  NUTRITION DIAGNOSIS:   Increased nutrient needs related to acute illness (pancreatitis) as evidenced by estimated needs.  Ongoing  GOAL:   Patient will meet greater than or equal to 90% of their needs  Progressing   MONITOR:   Diet advancement, Labs  REASON FOR ASSESSMENT:   Consult Enteral/tube feeding initiation and management  ASSESSMENT:   Pt with medical history significant for chronic atrial fibrillation on anticoagulation, history of hypertrophic obstructive cardiomyopathy with moderate MR, LVH, obstructive sleep apnea, dyslipidemia, obesity who presents to with severe acute pancreatitis.  1/23- post pyloric feeding tube placed (tip past duodenal bulb per x-ray)    Reviewed I/O's: -503 ml x 24 hours and +9.5 L since admission  UOP: 1.2 L x 24 hours   Per general surgery notes, pt started developing fevers over the past 24-48 hours. CT of abdomen and pelvis revealed concern for diverticulitis with small perforation. No plan for surgical intervention at this time, however, may need colostomy if does not improve.   Plan to stop enteral feeds at this time and downgrade diet to NPO with sips.   TPN to be initiated this evening at 40 ml/hr, which provides 1025 kcals and 54 grams protein, meeting 50% of estimated kcal needs and 51% of estimated protein needs. Case discussed with pharmacist via secure chat.   Medications reviewed and include vitamin B-12 and cardizem.   Labs reviewed: Na: 131, Phos: 2, CBGS: 157-176 (inpatient orders for glycemic control are ).    Diet Order:   Diet Order             Diet clear liquid Fluid consistency: Thin  Diet effective now                   EDUCATION NEEDS:   Education needs have been addressed  Skin:  Skin Assessment: Reviewed RN  Assessment  Last BM:  09/21/22 (type 6)  Height:   Ht Readings from Last 1 Encounters:  09/15/22 5\' 8"  (1.727 m)    Weight:   Wt Readings from Last 1 Encounters:  09/15/22 99.8 kg    Ideal Body Weight:  63.6 kg  BMI:  Body mass index is 33.45 kg/m.  Estimated Nutritional Needs:   Kcal:  2050-2250  Protein:  105-120 grams  Fluid:  > 2 L    Loistine Chance, RD, LDN, [place] Registered Dietitian II Certified Diabetes Care and Education Specialist Please refer to [facility] for RD and/or RD on-call/weekend/after hours pager

## 2022-09-23 NOTE — Progress Notes (Signed)
Peripherally Inserted Central Catheter Placement  The IV Nurse has discussed with the patient and/or persons authorized to consent for the patient, the purpose of this procedure and the potential benefits and risks involved with this procedure.  The benefits include less needle sticks, lab draws from the catheter, and the patient may be discharged home with the catheter. Risks include, but not limited to, infection, bleeding, blood clot (thrombus formation), and puncture of an artery; nerve damage and irregular heartbeat and possibility to perform a PICC exchange if needed/ordered by physician.  Alternatives to this procedure were also discussed.  Bard Power PICC patient education guide, fact sheet on infection prevention and patient information card has been provided to patient /or left at bedside.    PICC Placement Documentation  PICC Double Lumen 93/26/71 Right Basilic 41 cm 0 cm (Active)  Indication for Insertion or Continuance of Line Administration of hyperosmolar/irritating solutions (i.e. TPN, Vancomycin, etc.) 09/23/22 1507  Exposed Catheter (cm) 0 cm 09/23/22 1507  Site Assessment Clean, Dry, Intact 09/23/22 1507  Lumen #1 Status Flushed;Saline locked;Blood return noted 09/23/22 1507  Lumen #2 Status Flushed;Saline locked;Blood return noted 09/23/22 1507  Dressing Type Transparent;Securing device 09/23/22 1507  Dressing Status Antimicrobial disc in place 09/23/22 1507  Safety Lock Placed 09/23/22 1507  Dressing Intervention New dressing;Other (Comment) 09/23/22 1507  Dressing Change Due 09/30/22 09/23/22 1507       Molly Brown 09/23/2022, 3:27 PM

## 2022-09-23 NOTE — Assessment & Plan Note (Addendum)
Repeated CT 1/26 to re-assess pancreatitis and patient now has sigmoid diverticulitis with contained perforation.  She has point tenderness of LLQ on exam, no peritoneal signs --General surgery following --Continue TPN --Trial resume Dobhoff tube feeds per surgery --Continue Zosyn --NPO ex sips w meds or to wet mouth --Pain control, antiemetics PRN --Monitor abdominal exam closely --May require IR for abscess drainage but at this point, likely too small to target.  Surgery to discuss with IR.

## 2022-09-24 DIAGNOSIS — K5792 Diverticulitis of intestine, part unspecified, without perforation or abscess without bleeding: Secondary | ICD-10-CM | POA: Diagnosis not present

## 2022-09-24 DIAGNOSIS — F418 Other specified anxiety disorders: Secondary | ICD-10-CM | POA: Diagnosis not present

## 2022-09-24 DIAGNOSIS — E871 Hypo-osmolality and hyponatremia: Secondary | ICD-10-CM | POA: Diagnosis not present

## 2022-09-24 DIAGNOSIS — K859 Acute pancreatitis without necrosis or infection, unspecified: Secondary | ICD-10-CM | POA: Diagnosis not present

## 2022-09-24 LAB — COMPREHENSIVE METABOLIC PANEL
ALT: 36 U/L (ref 0–44)
AST: 35 U/L (ref 15–41)
Albumin: 2.2 g/dL — ABNORMAL LOW (ref 3.5–5.0)
Alkaline Phosphatase: 76 U/L (ref 38–126)
Anion gap: 8 (ref 5–15)
BUN: 11 mg/dL (ref 8–23)
CO2: 27 mmol/L (ref 22–32)
Calcium: 8.4 mg/dL — ABNORMAL LOW (ref 8.9–10.3)
Chloride: 98 mmol/L (ref 98–111)
Creatinine, Ser: 0.72 mg/dL (ref 0.44–1.00)
GFR, Estimated: >60 mL/min (ref >60)
Glucose, Bld: 135 mg/dL — ABNORMAL HIGH (ref 70–99)
Potassium: 3.9 mmol/L (ref 3.5–5.1)
Sodium: 133 mmol/L — ABNORMAL LOW (ref 135–145)
Total Bilirubin: 1 mg/dL (ref 0.3–1.2)
Total Protein: 5.9 g/dL — ABNORMAL LOW (ref 6.5–8.1)

## 2022-09-24 LAB — CBC
HCT: 30 % — ABNORMAL LOW (ref 36.0–46.0)
Hemoglobin: 10.1 g/dL — ABNORMAL LOW (ref 12.0–15.0)
MCH: 29.3 pg (ref 26.0–34.0)
MCHC: 33.7 g/dL (ref 30.0–36.0)
MCV: 87 fL (ref 80.0–100.0)
Platelets: 276 10*3/uL (ref 150–400)
RBC: 3.45 MIL/uL — ABNORMAL LOW (ref 3.87–5.11)
RDW: 14.6 % (ref 11.5–15.5)
WBC: 18.3 10*3/uL — ABNORMAL HIGH (ref 4.0–10.5)
nRBC: 0 % (ref 0.0–0.2)

## 2022-09-24 LAB — GLUCOSE, CAPILLARY
Glucose-Capillary: 136 mg/dL — ABNORMAL HIGH (ref 70–99)
Glucose-Capillary: 142 mg/dL — ABNORMAL HIGH (ref 70–99)
Glucose-Capillary: 143 mg/dL — ABNORMAL HIGH (ref 70–99)
Glucose-Capillary: 146 mg/dL — ABNORMAL HIGH (ref 70–99)
Glucose-Capillary: 148 mg/dL — ABNORMAL HIGH (ref 70–99)
Glucose-Capillary: 151 mg/dL — ABNORMAL HIGH (ref 70–99)
Glucose-Capillary: 153 mg/dL — ABNORMAL HIGH (ref 70–99)

## 2022-09-24 LAB — HEPARIN LEVEL (UNFRACTIONATED): Heparin Unfractionated: 0.37 IU/mL (ref 0.30–0.70)

## 2022-09-24 LAB — PHOSPHORUS: Phosphorus: 3.4 mg/dL (ref 2.5–4.6)

## 2022-09-24 LAB — PROCALCITONIN: Procalcitonin: 0.23 ng/mL

## 2022-09-24 LAB — MAGNESIUM: Magnesium: 2.1 mg/dL (ref 1.7–2.4)

## 2022-09-24 LAB — TRIGLYCERIDES: Triglycerides: 162 mg/dL — ABNORMAL HIGH (ref <150)

## 2022-09-24 MED ORDER — TRAVASOL 10 % IV SOLN
INTRAVENOUS | Status: AC
  Filled 2022-09-24: qty 1075.2

## 2022-09-24 MED ORDER — DIAZEPAM 5 MG/ML IJ SOLN
2.5000 mg | Freq: Three times a day (TID) | INTRAMUSCULAR | Status: DC | PRN
  Administered 2022-09-24 (×2): 2.5 mg via INTRAVENOUS
  Filled 2022-09-24 (×2): qty 2

## 2022-09-24 NOTE — Progress Notes (Signed)
Progress Note   Patient: Molly Brown IHK:[id number] DOB: [birth year] DOA: 09/15/2022     9 DOS: the patient was seen and examined on 09/24/2022   Brief hospital course: 72 y.o. 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 Patient presents for evaluation of sudden onset abdominal pain mostly in the epigastrium, periumbilical area.  She rates her pain a 10 x 10 in intensity at its worst.  Pain is nonradiating and is associated with nausea and multiple episodes of emesis.  She states that she has had issues with abdominal pain in the past usually self-limiting but this is the worst pain she has had which prompted her visit to the emergency room.  She is unable to tell me if her prior episodes of pain related to meals.  She has urinary frequency and has dyspnea with exertion which is chronic.  Her last bowel movement was 2 days prior to this admission.  According to the patient she usually has daily bowel movements She denies having any fever, no chills, no chest pain, no headache, no dizziness, no lightheadedness, no cough, no blurred vision no focal deficit. Abnormal labs include lactic acid of 2.0, lipase 539, troponin 24, white count 15.8 CT scan of abdomen and pelvis shows acute interstitial pancreatitis. No evidence of pancreatic necrosis, pancreatic ductal dilation or walled-off collections. Calcifications of the mitral annulus with left atrial dilation, consider further evaluation with cardiac echo if not previously performed. Mild pulmonary edema. Aortic Atherosclerosis. Twelve-lead EKG reviewed by me shows rapid A-fib Patient received 2 L IV fluid bolus, Cardizem 20 mg IV push x 1 and is currently on a Cardizem drip.  1/19.   Tapered off Cardizem drip and on oral metoprolol and oral Cardizem CD.  Started empiric antibiotics for pancreatitis since procalcitonin elevated and leukocytosis. 1/20.  Consulted general surgery with ?gallstone pancreatitis.  Surgery ordered for a repeat CAT scan.  Holding Eliquis and started heparin drip just in case surgery needed. 1/21.  Patient pulled out her IV and wanted to go home.  Patient was reoriented and then agreeable to stay and to surgery if needed.  Advanced diet to full liquids. 1/22.  Bilirubin rising, MRCP showed acute interstitial edematous pancreatitis with peripancreatic fluid collections with some signs of hemorrhagic or proteinaceous change.  Some peripancreatic necrosis seen.  Pancreatic ascites.  Rather striking tapered narrowing of the mid common bile duct with biliary enhancement.  Dilated main pancreatic duct.  Findings may reflect pancreatic divisum or dominant dorsal drainage via minor papilla. 1/23.  Dobbhoff tube had to be advanced twice.  Last x-ray showing in the duodenal bulb.  Okay to start tube feedings.  1/24: further K replacement.  Mental status worse again today.  Tube feeds running and appears to be tolerating okay.  Stopped antibiotics to monitor. 1/25: pt sleeping a lot. Fever 100.4 F last night, recurrent leukocytosis also.  Resumed Zosyn.  Replaced phos.  Sodium declining slowly. Cardiology gave IV Lasix. 1/26: repeated CT, has more pancreatic fluid collections but no necrosis. New finding of acute diverticulitis with contained perforation. Surgery re-consulted.  Starting TPN and continuing antibiotics  Assessment and Plan: * Severe acute pancreatitis Acute severe pancreatitis with increased fluid collections since prior CT but no evidence of necrosis on repeat CT (1/26).   On  empiric Zosyn - continuing for diverticulitis. General surgery no plan for cholecystectomy IgG4 pending.   The patient does not drink alcohol.  No new medications.   Triglycerides normal.  MRCP shows the possibility of pancreatic divisum which could increase risk of pancreatitis.   Lipase normalized pretty quickly. GI consulted, have signed off. Stopping tube feeds today and starting TPN due to acute diverticulitis.  Acute diverticulitis Repeated CT today 1/26 to re-assess pancreatitis and patient now has sigmoid diverticulitis with contained perforation.  She has point tenderness of LLQ on exam, no peritoneal signs --General surgery re-consulted --Stop tube feeds --Starting TPN --Continue Zosyn --NPO ex sips w meds or to wet mouth --Pain control, antiemetics PRN   Acute metabolic encephalopathy Improved after removing IV pain medications. 1/24 recurrent. Possibly due to needing more pain med yesterday --check ammonia level and VBG   Ammonia mildly elevated at 43. Given lactulose x 1 day  Continue lactulose if needed. Bowel regimen.  Atrial fibrillation with RVR (HCC) Chronic in nature.  Continue metoprolol and Cardizem CD.  Currently on heparin drip.  Situational anxiety Pt reports significant anxiety and family note she has physical shaking at times when anxious.  She reports some baseline anxiety, but severely exacerbated by  her current situation. --Low dose IV Valium PRN for anxiety.  Hypophosphatemia Phos 2.0 this AM, replacing. Monitor and further replace PRN.  Hyponatremia Na slowly down-trending, today 129. Cardiology gave IV Lasix this AM. Suspect hypervolemia, pt edematous. Monitor BMP and volume status.  Hypokalemia K 3.6 today Monitor BMP  Acute pancreatitis See severe acute pancreatitis  Obesity (BMI 30-39.9) Body mass index is 33.45 kg/m. Complicates overall care and prognosis.  Recommend lifestyle modifications including physical activity and diet for weight loss and overall long-term health.   HOCM (hypertrophic obstructive cardiomyopathy) ([place]) Patient with a history of HOCM Continue  metoprolol.   Hypothyroidism Continue Synthroid  GERD (gastroesophageal reflux disease) Continue PPI        Subjective: Patient seen with husband and sister this AM.  Pt feels okay, but tired of being so sick, unable to remain comfortable in the bed.  Family ask if patient care tasks can be held when she is sleeping soundly.  She reports ongoing LLQ pain, mildly improved epigastric pain, no N/V.  No F/C.  Physical Exam: Vitals:   09/24/22 1100 09/24/22 1159 09/24/22 1200 09/24/22 1300  BP:  102/72    Pulse:  71    Resp: 18 14 (!) 22 16  Temp:  98.4 F (36.9 C)    TempSrc:  Oral    SpO2:  98%    Weight:      Height:       General exam: awake, appears drowsy, no acute distress, obese HEENT: Dobhoff NG tube in place with tube feeds off, moist mucus membranes, hearing grossly normal  Respiratory system: on room air, normal respiratory effort. Cardiovascular system: RRR, improved LE edema   Gastrointestinal system: distended, stabble LLQ point tenderness, no rebound tenderness or peritoneal signs Central nervous system: Ox4, no gross focal neurologic deficits, normal speech Extremities: moves all, no edema, normal tone Skin: dry, intact, normal temperature Psychiatry: normal mood, congruent affect, judgement and insight appear normal     Data Reviewed: Notable labs -- Na improved 133, glucose 135, Ca 8.4, albumin 2.2, Tprotein 5.9, TG's 162, WBC 18.3 slightly improved. Procal improved 0.23 from 0.40 Hbg 10.1 stable.  Repeat CT abdomen/pelvis with IV contrast 1/26  AM --- new sigmoid diverticulitis and more  pancreatic fluid collections, but none are rim enhancing, no evidence of necrosis. IMPRESSION: "1. Interval changes of acute diverticulitis involving the proximal sigmoid colon with a contained perforation at this level containing fluid and gas laterally, measuring 3.1 x 1.6 x 2.4 cm. There is an adjacent tiny collection of extraluminal gas. 2. Increased  peripancreatic edema and fluid including multiple developing fluid collections without surrounding rim enhancement at this time and no evidence of pancreatic necrosis. 3. Small bilateral pleural effusions, decreased on the right and increased on the left. 4. Small amount of free peritoneal fluid, decreased. 5. Marked left atrial enlargement.  "   Family Communication: Spoke with husband and sister at the bedside on rounds this AM.  Disposition: Status is: Inpatient Remains inpatient appropriate because: On TPN, IV antibiotics.  Pt remains severely ill at this time as outlined above.   Planned Discharge Destination: Home with Home Health    Time spent: 40 minutes  Author: Pennie Banter, DO 09/24/2022 3:46 PM  For on call review [email or web address].

## 2022-09-24 NOTE — Consult Note (Addendum)
[place] for IV Heparin Indication: atrial fibrillation  Patient Measurements: Height: 5\' 8"  (172.7 cm) Weight: 109.5 kg (241 lb 4.8 oz) IBW/kg (Calculated) : 63.9 Heparin Dosing Weight: 85.8 kg  Labs: Recent Labs    09/22/22 0642 09/23/22 0705 09/24/22 0413  HGB 10.6* 10.4* 10.1*  HCT 31.4* 30.8* 30.0*  PLT 247 267 276  HEPARINUNFRC 0.51 0.35 0.37  CREATININE 0.71 0.75 0.72    Estimated Creatinine Clearance: 86 mL/min (by C-G formula based on SCr of 0.72 mg/dL).  Medical History: Past Medical History:  Diagnosis Date   AF (atrial fibrillation) (HCC)    Anal fissure    Anxiety    Cardiac arrhythmia due to congenital heart disease    Chicken pox    Depression    GERD (gastroesophageal reflux disease)    Heart murmur     Medications:  Apixaban 5 mg BID (last dose 09/16/22 at 2251)  Assessment: 72 y/o F with medical history including Afib on apixaban who is admitted with acute pancreatitis. General surgery consulted. Pharmacy consulted to initiate and manage heparin infusion while apixaban is on hold pending surgery evaluation. Heparin was stopped early morning 1/21 @ 0300 for surgery but patient then became not amenable to procedure, pulled out her IV, and wanted to go home. She was reoriented and agreed to surgery, which was subsequently postponed. In order to allow time for new IV placement, per discussion with MD, we opted to give a one-time dose of enoxaparin 1 mg/kg to provide anticoagulation until IV access could be established again for heparin. IV access established again 1/21 @ ~1530.  1/20 1734 aPTT 79 before heparin started. No baseline heparin level ordered prior to giving dose of enoxaparin.  1/22 0816 aPTT 52 HL 0.71 1/22 1615 aPTT 58 1/22 2251 aPTT 51, subtherapeutic 1/23 0502 aPTT 80 therapeutic x 1, HL 0.62 1/23 1228 aPTT 77 HL 0.53 1/24 0428 HL 0.55 therapeutic x 3, aPTT 63 1/25 0642 HL 0.51, therapeutic x  4 1/26 0705 HL 0.35, therapeutic x 5 1/27 0413 HL 0.37, therapeutic x 6  Goal of Therapy:  Heparin level 0.3-0.7 units/ml Monitor platelets by anticoagulation protocol: Yes  Plan: --Heparin level continues to be therapeutic --Continue heparin infusion at 1800 units/hr --Re-check HL tomorrow AM --Daily CBC per protocol while on IV heparin --Follow-up transition back to home apixaban when appropriate  Renda Rolls, PharmD, [facility] 09/24/2022 5:46 AM

## 2022-09-24 NOTE — Consult Note (Signed)
PHARMACY - TOTAL PARENTERAL NUTRITION CONSULT NOTE   Indication: Prolonged ileus  Patient Measurements: Height: 5\' 8"  (172.7 cm) Weight: 109.5 kg (241 lb 4.8 oz) IBW/kg (Calculated) : 63.9 TPN AdjBW (KG): 72.9 Body mass index is 36.69 kg/m.  Assessment:  71 y/o F with PMH including Afib on apixaban, HOCM with moderate MR, LVH, OSA, HLD, obesity who presented to the ED 1/18 with abdominal pain. Patient subsequently admitted for acute pancreatitis. Hospital course complicated by development of diverticulitis with contained perforation. Enteric feedings placed on hold. Pharmacy consulted to initiate and manage TPN for prolonged ileus.   Glucose / Insulin:  --Hgb A1c on 05/31/22 was 6% (pre-diabetes) --Patient has been mildly hyperglycemic on tube feeds but within goal range -BG  176-129    SSI per 24 hrs: 4 units  TG=162 Electrolytes:  --Hyponatremia  Renal:  --Scr < 1 Hepatic:  --Overall within normal limits, hyperbilirubinemia resolved Intake / Output; MIVF:  --I&Os have not been charted strictly --No MIVF GI Imaging: 1/26 CT abdomen / pelvis: Interval changes of acute diverticulitis involving the proximal sigmoid colon with a contained perforation. Increased peripancreatic edema and fluid including multiple developing fluid collections without surrounding rim enhancement at this time and no evidence of pancreatic necrosis. GI Surgeries / Procedures: N/A  Central access: PICC placed 1/26 TPN start date: 1/26  Nutritional Goals: Goal TPN rate is 80 mL/hr (provides 107 g of protein and 2050 kcals per day)  RD Assessment: Estimated Needs Total Energy Estimated Needs: 2050-2250 Total Protein Estimated Needs: 105-120 grams Total Fluid Estimated Needs: > 2 L  Current Nutrition:  TPN Tube feed stopped 1/26  Plan:  --Will increase to goal TPN rate of 80 mL/hr at 1800 --Electrolytes in TPN: Na 69mEq/L, K 42mEq/L, Ca 69mEq/L, Mg 74mEq/L, and Phos 3mmol/L. Cl:Ac 1:1 --Add  standard MVI and trace elements to TPN, thiamine 100 mg x 3 days (day #2) -on MVI po also -TG 162 --Initiate Moderate q6h SSI and adjust as needed  --Monitor TPN labs 1st 3 days then on Mon/Thurs, daily until stable  Teena Mangus A 09/24/2022,11:06 AM

## 2022-09-24 NOTE — Assessment & Plan Note (Signed)
Pt reports significant anxiety and family note she has physical shaking at times when anxious.  She reports some baseline anxiety, but severely exacerbated by  her current situation. --Low dose IV Valium PRN for anxiety.

## 2022-09-24 NOTE — Progress Notes (Signed)
[facility]    SUBJECTIVE: Patient states to be doing reasonably well no pain being treated for diverticulitis pancreatitis no palpitations or tachycardia no fever chills or sweats resting comfortably   Vitals:   09/24/22 0800 09/24/22 0839 09/24/22 0840 09/24/22 0900  BP:  121/73    Pulse:  76    Resp: 17 16 20  (!) 22  Temp:  98.8 F (37.1 C)    TempSrc:  Oral    SpO2:  96%    Weight:      Height:         Intake/Output Summary (Last 24 hours) at 09/24/2022 1127 Last data filed at 09/24/2022 0840 Gross per 24 hour  Intake 4440.95 ml  Output --  Net 4440.95 ml      PHYSICAL EXAM  General: Well developed, well nourished, in no acute distress HEENT:  Normocephalic and atramatic Neck:  No JVD.  Lungs: Clear bilaterally to auscultation and percussion. Heart: Irregular irregular. Normal S1 and S2 without gallops or murmurs.  Abdomen: Bowel sounds are positive, abdomen soft and non-tender  Msk:  Back normal, normal gait. Normal strength and tone for age. Extremities: No clubbing, cyanosis or edema.   Neuro: Alert and oriented X 3. Psych:  Good affect, responds appropriately   LABS: Basic Metabolic Panel: Recent Labs    09/22/22 0642 09/22/22 1528 09/23/22 0705 09/24/22 0413  NA 129*  --  131* 133*  K 3.6   < > 3.5 3.9  CL 98  --  97* 98  CO2 26  --  27 27  GLUCOSE 164*  --  155* 135*  BUN 9  --  12 11  CREATININE 0.71  --  0.75 0.72  CALCIUM 7.7*  --  7.8* 8.4*  MG 2.0  --   --  2.1  PHOS 1.4*  --  2.0* 3.4   < > = values in this interval not displayed.   Liver Function Tests: Recent Labs    09/23/22 0705 09/24/22 0413  AST 42* 35  ALT 39 36  ALKPHOS 82 76  BILITOT 1.1 1.0  PROT 5.6* 5.9*  ALBUMIN 2.1* 2.2*   No results for input(s): "LIPASE", "AMYLASE" in the last 72 hours. CBC: Recent Labs    09/23/22 0705 09/24/22 0413  WBC 18.7* 18.3*  HGB 10.4* 10.1*  HCT 30.8* 30.0*  MCV 86.0 87.0  PLT 267 276   Cardiac Enzymes: No results for  input(s): "CKTOTAL", "CKMB", "CKMBINDEX", "TROPONINI" in the last 72 hours. BNP: Invalid input(s): "POCBNP" D-Dimer: No results for input(s): "DDIMER" in the last 72 hours. Hemoglobin A1C: No results for input(s): "HGBA1C" in the last 72 hours. Fasting Lipid Panel: Recent Labs    09/24/22 0413  TRIG 162*   Thyroid Function Tests: No results for input(s): "TSH", "T4TOTAL", "T3FREE", "THYROIDAB" in the last 72 hours.  Invalid input(s): "FREET3" Anemia Panel: No results for input(s): "VITAMINB12", "FOLATE", "FERRITIN", "TIBC", "IRON", "RETICCTPCT" in the last 72 hours.  DG Chest Portable 1 View  Result Date: 09/23/2022 CLINICAL DATA:  Provided history: PICC placement. EXAM: PORTABLE CHEST 1 VIEW COMPARISON:  Prior chest radiographs 09/15/2022. FINDINGS: Right-sided PICC with tip at the level of the upper right atrium. Central pulmonary vascular congestion without overt pulmonary edema. Minimal atelectasis at both lung bases. No evidence of pleural effusion or pneumothorax. No acute bony abnormality identified. IMPRESSION: 1. Right-sided PICC with tip at the level of the upper right atrium. 2. Central pulmonary vascular congestion without overt pulmonary edema. 3. Minimal  atelectasis at both lung bases. Electronically Signed   By: Kellie Simmering D.O.   On: 09/23/2022 15:33   [place] EKG SITE RITE  Result Date: 09/23/2022 If Site Rite image not attached, placement could not be confirmed due to current cardiac rhythm.  CT ABDOMEN PELVIS W CONTRAST  Result Date: 09/23/2022 CLINICAL DATA:  Follow-up acute pancreatitis.  Persistent symptoms. EXAM: CT ABDOMEN AND PELVIS WITH CONTRAST TECHNIQUE: Multidetector CT imaging of the abdomen and pelvis was performed using the standard protocol following bolus administration of intravenous contrast. RADIATION DOSE REDUCTION: This exam was performed according to the departmental dose-optimization program which includes automated exposure control, adjustment of  the mA and/or kV according to patient size and/or use of iterative reconstruction technique. CONTRAST:  128mL OMNIPAQUE IOHEXOL 300 MG/ML  SOLN COMPARISON:  Abdomen MRI dated 09/19/2022. Abdomen and pelvis CT dated 09/17/2022. FINDINGS: Lower chest: Small bilateral pleural effusions, decreased on the right and increased on the left. Minimal bibasilar atelectasis. Again demonstrated is marked left atrial enlargement. Hepatobiliary: No focal liver abnormality is seen. No gallstones, gallbladder wall thickening, or biliary dilatation. Pancreas: Increased peripancreatic edema and fluid including multiple developing fluid collections without surrounding rim enhancement at this time. The pancreatic parenchyma is unremarkable. Spleen: Normal in size without focal abnormality. Adrenals/Urinary Tract: Normal appearing adrenal glands. Small lower pole right renal cyst and tiny left renal cyst. These do not need imaging follow-up. Unremarkable ureters and urinary bladder. Stomach/Bowel: Interval development of extensive pericolonic soft tissue stranding at the level of multiple diverticula involving the proximal sigmoid colon. There are other diverticula elsewhere in the sigmoid and descending colon. Is also a contained perforation at this level containing fluid and gas laterally, measuring 3.1 x 1.6 cm in the coronal plane image number 41/6 2.4 cm in width on image number 56/4. Is also a tiny amount of adjacent extraluminal gas. Feeding tube extending through the stomach with its tip in the duodenal bulb. Unremarkable small bowel and appendix. Vascular/Lymphatic: Atheromatous arterial calcifications without aneurysm. No enlarged lymph nodes. Reproductive: Status post hysterectomy. No adnexal masses. Other: Small amount of free peritoneal fluid. Musculoskeletal: Mild lumbar and lower thoracic spine degenerative changes. IMPRESSION: 1. Interval changes of acute diverticulitis involving the proximal sigmoid colon with a  contained perforation at this level containing fluid and gas laterally, measuring 3.1 x 1.6 x 2.4 cm. There is an adjacent tiny collection of extraluminal gas. 2. Increased peripancreatic edema and fluid including multiple developing fluid collections without surrounding rim enhancement at this time and no evidence of pancreatic necrosis. 3. Small bilateral pleural effusions, decreased on the right and increased on the left. 4. Small amount of free peritoneal fluid, decreased. 5. Marked left atrial enlargement. These results will be called to the ordering clinician or representative by the Radiologist Assistant, and communication documented in the PACS or [employer]. Electronically Signed   By: Claudie Revering M.D.   On: 09/23/2022 10:37     Echo preserved left ventricular function EF around 60% patient stated no chest pain no abdominal pain  TELEMETRY: Atrial fibrillation rate controlled at 80 nonspecific findings:  ASSESSMENT AND PLAN:  Principal Problem:   Severe acute pancreatitis Active Problems:   GERD (gastroesophageal reflux disease)   Hypothyroidism   Atrial fibrillation with RVR (HCC)   HOCM (hypertrophic obstructive cardiomyopathy) (HCC)   Obesity (BMI 30-39.9)   Acute metabolic encephalopathy   Sterile pancreatic necrosis   Acute pancreatitis   Hypokalemia   Hyponatremia   Hypophosphatemia   Diverticulitis of  colon   Acute diverticulitis    Plan Severe acute pancreatitis fluid collection noticed on CT no necrosis on Zosyn being followed by general surgery continue conservative management Diverticulitis continue Zosyn therapy to help with symptom management and control Nutrition patient has been getting TPN because of acute diverticulitis Acute metabolic encephalopathy improved after reducing the discontinued pain medications following up with ammonia levels Chronic atrial fibrillation with RVR controlled now on Cardizem metoprolol and heparin drip will transition back  to long-term anticoagulation on discharge   Yolonda Kida, MD 09/24/2022 11:27 AM

## 2022-09-24 NOTE — Progress Notes (Signed)
Subjective:  CC: Molly Brown is a 72 y.o. female  Hospital stay day 9,  necrotizing pancreatitis now found to have diverticulitis with contained perforation/small abscess.   HPI: No acute issues reported overnight.  ROS:  General: Denies weight loss, weight gain, fatigue, fevers, chills, and night sweats. Heart: Denies chest pain, palpitations, racing heart, irregular heartbeat, leg pain or swelling, and decreased activity tolerance. Respiratory: Denies breathing difficulty, shortness of breath, wheezing, cough, and sputum. GI: Denies change in appetite, heartburn, nausea, vomiting, constipation, diarrhea, and blood in stool. GU: Denies difficulty urinating, pain with urinating, urgency, frequency, blood in urine.   Objective:   Temp:  [98.1 F (36.7 C)-99 F (37.2 C)] 98.4 F (36.9 C) (01/27 1159) Pulse Rate:  [71-106] 71 (01/27 1159) Resp:  [14-23] 16 (01/27 1300) BP: (102-121)/(64-73) 102/72 (01/27 1159) SpO2:  [94 %-98 %] 98 % (01/27 1159) Weight:  [109.5 kg] 109.5 kg (01/26 1600)     Height: 5\' 8"  (172.7 cm) Weight: 109.5 kg BMI (Calculated): 36.7   Intake/Output this shift:   Intake/Output Summary (Last 24 hours) at 09/24/2022 1347 Last data filed at 09/24/2022 1300 Gross per 24 hour  Intake 1437.22 ml  Output --  Net 1437.22 ml    Constitutional :  alert, cooperative, appears stated age, and no distress  Respiratory:  clear to auscultation bilaterally  Cardiovascular:  regular rate and rhythm  Gastrointestinal: Soft, no guarding, tenderness to palpation left lower quadrant.  Dobbhoff tube in place. .   Skin: Cool and moist.   Psychiatric: Normal affect, non-agitated, not confused       LABS:     Latest Ref Rng & Units 09/24/2022    4:13 AM 09/23/2022    7:05 AM 09/22/2022    3:28 PM  CMP  Glucose 70 - 99 mg/dL 135  155    BUN 8 - 23 mg/dL 11  12    Creatinine 0.44 - 1.00 mg/dL 0.72  0.75    Sodium 135 - 145 mmol/L 133  131    Potassium 3.5 - 5.1 mmol/L 3.9   3.5  3.7   Chloride 98 - 111 mmol/L 98  97    CO2 22 - 32 mmol/L 27  27    Calcium 8.9 - 10.3 mg/dL 8.4  7.8    Total Protein 6.5 - 8.1 g/dL 5.9  5.6    Total Bilirubin 0.3 - 1.2 mg/dL 1.0  1.1    Alkaline Phos 38 - 126 U/L 76  82    AST 15 - 41 U/L 35  42    ALT 0 - 44 U/L 36  39        Latest Ref Rng & Units 09/24/2022    4:13 AM 09/23/2022    7:05 AM 09/22/2022    6:42 AM  CBC  WBC 4.0 - 10.5 K/uL 18.3  18.7  16.1   Hemoglobin 12.0 - 15.0 g/dL 10.1  10.4  10.6   Hematocrit 36.0 - 46.0 % 30.0  30.8  31.4   Platelets 150 - 400 K/uL 276  267  247     RADS: N/a Assessment:   necrotizing pancreatitis now found to have diverticulitis with contained perforation/small abscess.   No acute changes overnight.  Recommend continuing IV antibiotics, bowel rest with strict n.p.o., TPN, pain control.  labs/images/medications/previous chart entries reviewed personally and relevant changes/updates noted above.

## 2022-09-25 DIAGNOSIS — K859 Acute pancreatitis without necrosis or infection, unspecified: Secondary | ICD-10-CM | POA: Diagnosis not present

## 2022-09-25 LAB — BASIC METABOLIC PANEL
Anion gap: 3 — ABNORMAL LOW (ref 5–15)
BUN: 12 mg/dL (ref 8–23)
CO2: 25 mmol/L (ref 22–32)
Calcium: 8.3 mg/dL — ABNORMAL LOW (ref 8.9–10.3)
Chloride: 104 mmol/L (ref 98–111)
Creatinine, Ser: 0.76 mg/dL (ref 0.44–1.00)
GFR, Estimated: >60 mL/min (ref >60)
Glucose, Bld: 157 mg/dL — ABNORMAL HIGH (ref 70–99)
Potassium: 4.3 mmol/L (ref 3.5–5.1)
Sodium: 132 mmol/L — ABNORMAL LOW (ref 135–145)

## 2022-09-25 LAB — GLUCOSE, CAPILLARY
Glucose-Capillary: 147 mg/dL — ABNORMAL HIGH (ref 70–99)
Glucose-Capillary: 153 mg/dL — ABNORMAL HIGH (ref 70–99)
Glucose-Capillary: 162 mg/dL — ABNORMAL HIGH (ref 70–99)
Glucose-Capillary: 169 mg/dL — ABNORMAL HIGH (ref 70–99)
Glucose-Capillary: 172 mg/dL — ABNORMAL HIGH (ref 70–99)

## 2022-09-25 LAB — HEPARIN LEVEL (UNFRACTIONATED): Heparin Unfractionated: 0.34 IU/mL (ref 0.30–0.70)

## 2022-09-25 LAB — CBC
HCT: 31.6 % — ABNORMAL LOW (ref 36.0–46.0)
Hemoglobin: 10.2 g/dL — ABNORMAL LOW (ref 12.0–15.0)
MCH: 28.6 pg (ref 26.0–34.0)
MCHC: 32.3 g/dL (ref 30.0–36.0)
MCV: 88.5 fL (ref 80.0–100.0)
Platelets: 299 10*3/uL (ref 150–400)
RBC: 3.57 MIL/uL — ABNORMAL LOW (ref 3.87–5.11)
RDW: 14.8 % (ref 11.5–15.5)
WBC: 16.2 10*3/uL — ABNORMAL HIGH (ref 4.0–10.5)
nRBC: 0.1 % (ref 0.0–0.2)

## 2022-09-25 LAB — PHOSPHORUS: Phosphorus: 3.6 mg/dL (ref 2.5–4.6)

## 2022-09-25 LAB — MAGNESIUM: Magnesium: 2.3 mg/dL (ref 1.7–2.4)

## 2022-09-25 MED ORDER — FUROSEMIDE 10 MG/ML IJ SOLN
40.0000 mg | Freq: Once | INTRAMUSCULAR | Status: AC
  Administered 2022-09-25: 40 mg via INTRAVENOUS
  Filled 2022-09-25: qty 4

## 2022-09-25 MED ORDER — TRAVASOL 10 % IV SOLN
INTRAVENOUS | Status: AC
  Filled 2022-09-25: qty 1075.2

## 2022-09-25 NOTE — Consult Note (Signed)
PHARMACY - TOTAL PARENTERAL NUTRITION CONSULT NOTE   Indication: Prolonged ileus  Patient Measurements: Height: 5\' 8"  (172.7 cm) Weight: 109.5 kg (241 lb 4.8 oz) IBW/kg (Calculated) : 63.9 TPN AdjBW (KG): 72.9 Body mass index is 36.69 kg/m.  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 Patient subsequently admitted for acute pancreatitis. Hospital course complicated by development of diverticulitis with contained perforation. Enteric feedings placed on hold. Pharmacy consulted to initiate and manage TPN for prolonged ileus.   Glucose / Insulin:  --Hgb A1c on 05/31/22 was 6% (pre-diabetes) --Patient has been mildly hyperglycemic on tube feeds but within goal range -BG  142-172  SSI per 24 hrs: 9 units  1/27: TG=162 Electrolytes:  --Hyponatremia  Renal:  --Scr < 1 Hepatic:  --Overall within normal limits, hyperbilirubinemia resolved Intake / Output; MIVF:  --I&Os have not been charted strictly --No MIVF GI Imaging: 1/26 CT abdomen / pelvis: Interval changes of acute diverticulitis involving the proximal sigmoid colon with a contained perforation. Increased peripancreatic edema and fluid including multiple developing fluid collections without surrounding rim enhancement at this time and no evidence of pancreatic necrosis. GI Surgeries / Procedures: N/A  Central access: PICC placed 1/26 TPN start date: 1/26  Nutritional Goals: Goal TPN rate is 80 mL/hr (provides 107 g of protein and 2050 kcals per day)  RD Assessment: Estimated Needs Total Energy Estimated Needs: 2050-2250 Total Protein Estimated Needs: 105-120 grams Total Fluid Estimated Needs: > 2 L  Current Nutrition:  TPN Tube feed stopped 1/26 -strict NPO/bowel rest, PO meds being given  Plan:  --Will continue goal TPN rate of 80 mL/hr at 1800 --Electrolytes in TPN: Na 24mEq/L, K 70mEq/L, Ca 65mEq/L, Mg  69mEq/L, and Phos 58mmol/L. Cl:Ac 1:1 --Add standard MVI and trace elements to TPN, thiamine 100 mg x 3 days (day #3) -discontinue po MVI for now -1/27: TG 162 --Initiate Moderate q6h SSI and adjust as needed  --Monitor TPN labs 1st 3 days then on Mon/Thurs, daily until stable  Kerisha Goughnour A 09/25/2022,9:45 AM

## 2022-09-25 NOTE — Progress Notes (Signed)
[facility]    SUBJECTIVE: Patient states he feels reasonably well denies any palpitation tachycardia no chest pain no fever still has some mild abdominal discomfort   Vitals:   09/25/22 0714 09/25/22 0800 09/25/22 0900 09/25/22 1000  BP: 122/78     Pulse: 72     Resp: 18 16 19 19   Temp: 98.2 F (36.8 C)     TempSrc: Oral     SpO2: 98%     Weight:      Height:         Intake/Output Summary (Last 24 hours) at 09/25/2022 1112 Last data filed at 09/25/2022 1000 Gross per 24 hour  Intake 2236.77 ml  Output --  Net 2236.77 ml      PHYSICAL EXAM  General: Well developed, well nourished, in no acute distress HEENT:  Normocephalic and atramatic Neck:  No JVD.  Lungs: Clear bilaterally to auscultation and percussion. Heart: HRRR . Normal S1 and S2 without gallops or murmurs.  Abdomen: Bowel sounds are positive, abdomen soft and non-tender  Msk:  Back normal, normal gait. Normal strength and tone for age. Extremities: No clubbing, cyanosis or edema.   Neuro: Alert and oriented X 3. Psych:  Good affect, responds appropriately   LABS: Basic Metabolic Panel: Recent Labs    09/24/22 0413 09/25/22 0507  NA 133* 132*  K 3.9 4.3  CL 98 104  CO2 27 25  GLUCOSE 135* 157*  BUN 11 12  CREATININE 0.72 0.76  CALCIUM 8.4* 8.3*  MG 2.1 2.3  PHOS 3.4 3.6   Liver Function Tests: Recent Labs    09/23/22 0705 09/24/22 0413  AST 42* 35  ALT 39 36  ALKPHOS 82 76  BILITOT 1.1 1.0  PROT 5.6* 5.9*  ALBUMIN 2.1* 2.2*   No results for input(s): "LIPASE", "AMYLASE" in the last 72 hours. CBC: Recent Labs    09/24/22 0413 09/25/22 0507  WBC 18.3* 16.2*  HGB 10.1* 10.2*  HCT 30.0* 31.6*  MCV 87.0 88.5  PLT 276 299   Cardiac Enzymes: No results for input(s): "CKTOTAL", "CKMB", "CKMBINDEX", "TROPONINI" in the last 72 hours. BNP: Invalid input(s): "POCBNP" D-Dimer: No results for input(s): "DDIMER" in the last 72 hours. Hemoglobin A1C: No results for input(s):  "HGBA1C" in the last 72 hours. Fasting Lipid Panel: Recent Labs    09/24/22 0413  TRIG 162*   Thyroid Function Tests: No results for input(s): "TSH", "T4TOTAL", "T3FREE", "THYROIDAB" in the last 72 hours.  Invalid input(s): "FREET3" Anemia Panel: No results for input(s): "VITAMINB12", "FOLATE", "FERRITIN", "TIBC", "IRON", "RETICCTPCT" in the last 72 hours.  DG Chest Portable 1 View  Result Date: 09/23/2022 CLINICAL DATA:  Provided history: PICC placement. EXAM: PORTABLE CHEST 1 VIEW COMPARISON:  Prior chest radiographs 09/15/2022. FINDINGS: Right-sided PICC with tip at the level of the upper right atrium. Central pulmonary vascular congestion without overt pulmonary edema. Minimal atelectasis at both lung bases. No evidence of pleural effusion or pneumothorax. No acute bony abnormality identified. IMPRESSION: 1. Right-sided PICC with tip at the level of the upper right atrium. 2. Central pulmonary vascular congestion without overt pulmonary edema. 3. Minimal atelectasis at both lung bases. Electronically Signed   By: Kellie Simmering D.O.   On: 09/23/2022 15:33   [place] EKG SITE RITE  Result Date: 09/23/2022 If Site Rite image not attached, placement could not be confirmed due to current cardiac rhythm.    Echo 5/23 evidence of preserved left ventricular function is consistent with HOCM no evidence of  obstruction mild aortic stenosis moderate mitral regurgitation   TELEMETRY: Atrial fibrillation rate of 91 nonspecific ST-T wave changes:  ASSESSMENT AND PLAN:  Principal Problem:   Severe acute pancreatitis Active Problems:   GERD (gastroesophageal reflux disease)   Hypothyroidism   Atrial fibrillation with RVR (HCC)   HOCM (hypertrophic obstructive cardiomyopathy) (HCC)   Obesity (BMI 30-39.9)   Acute metabolic encephalopathy   Sterile pancreatic necrosis   Acute pancreatitis   Hypokalemia   Hyponatremia   Hypophosphatemia   Diverticulitis of colon   Acute diverticulitis    Situational anxiety    Plan Atrial fibrillation paroxysmal persistent continue anticoagulation currently on heparin will transition back to Eliquis Acute pancreatitis unclear etiology continue management appreciate GI input Diverticulitis continue broad-spectrum antibiotic therapy GERD maintain PPI therapy to help with symptom control HOCM no significant evidence of obstruction continue current management Obesity recommend modest weight loss exercise portion control Continue conservative cardiac input at this point    Yolonda Kida, MD 09/25/2022 11:12 AM

## 2022-09-25 NOTE — Consult Note (Signed)
[place] for IV Heparin Indication: atrial fibrillation  Patient Measurements: Height: 5\' 8"  (172.7 cm) Weight: 109.5 kg (241 lb 4.8 oz) IBW/kg (Calculated) : 63.9 Heparin Dosing Weight: 85.8 kg  Labs: Recent Labs    09/23/22 0705 09/24/22 0413 09/25/22 0502 09/25/22 0507  HGB 10.4* 10.1*  --  10.2*  HCT 30.8* 30.0*  --  31.6*  PLT 267 276  --  299  HEPARINUNFRC 0.35 0.37 0.34  --   CREATININE 0.75 0.72  --  0.76    Estimated Creatinine Clearance: 86 mL/min (by C-G formula based on SCr of 0.76 mg/dL).  Medical History: Past Medical History:  Diagnosis Date   AF (atrial fibrillation) (HCC)    Anal fissure    Anxiety    Cardiac arrhythmia due to congenital heart disease    Chicken pox    Depression    GERD (gastroesophageal reflux disease)    Heart murmur     Medications:  Apixaban 5 mg BID (last dose 09/16/22 at 2251)  Assessment: 72 y/o F with medical history including Afib on apixaban who is admitted with acute pancreatitis. General surgery consulted. Pharmacy consulted to initiate and manage heparin infusion while apixaban is on hold pending surgery evaluation. Heparin was stopped early morning 1/21 @ 0300 for surgery but patient then became not amenable to procedure, pulled out her IV, and wanted to go home. She was reoriented and agreed to surgery, which was subsequently postponed. In order to allow time for new IV placement, per discussion with MD, we opted to give a one-time dose of enoxaparin 1 mg/kg to provide anticoagulation until IV access could be established again for heparin. IV access established again 1/21 @ ~1530.  1/20 1734 aPTT 79 before heparin started. No baseline heparin level ordered prior to giving dose of enoxaparin.  1/22 0816 aPTT 52 HL 0.71 1/22 1615 aPTT 58 1/22 2251 aPTT 51, subtherapeutic 1/23 0502 aPTT 80 therapeutic x 1, HL 0.62 1/23 1228 aPTT 77 HL 0.53 1/24 0428 HL 0.55 therapeutic x 3, aPTT  63 1/25 0642 HL 0.51, therapeutic x 4 1/26 0705 HL 0.35, therapeutic x 5 1/27 0413 HL 0.37, therapeutic x 6 1/28 0502 HL 0.34, therapeutic x 7  Goal of Therapy:  Heparin level 0.3-0.7 units/ml Monitor platelets by anticoagulation protocol: Yes  Plan: --Heparin level continues to be therapeutic --Continue heparin infusion at 1800 units/hr --Re-check HL tomorrow AM --Daily CBC per protocol while on IV heparin --Follow-up transition back to home apixaban when appropriate  Renda Rolls, PharmD, [facility] 09/25/2022 6:43 AM

## 2022-09-25 NOTE — Progress Notes (Signed)
Progress Note   Patient: Molly Brown UMP:[id number] DOB: 1951-01-26 DOA: 09/15/2022     10 DOS: the patient was seen and examined on 09/25/2022   Brief hospital course: 72 y.o. 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 Patient presents for evaluation of sudden onset abdominal pain mostly in the epigastrium, periumbilical area.  She rates her pain a 10 x 10 in intensity at its worst.  Pain is nonradiating and is associated with nausea and multiple episodes of emesis.  She states that she has had issues with abdominal pain in the past usually self-limiting but this is the worst pain she has had which prompted her visit to the emergency room.  She is unable to tell me if her prior episodes of pain related to meals.  She has urinary frequency and has dyspnea with exertion which is chronic.  Her last bowel movement was 2 days prior to this admission.  According to the patient she usually has daily bowel movements She denies having any fever, no chills, no chest pain, no headache, no dizziness, no lightheadedness, no cough, no blurred vision no focal deficit. Abnormal labs include lactic acid of 2.0, lipase 539, troponin 24, white count 15.8 CT scan of abdomen and pelvis shows acute interstitial pancreatitis. No evidence of pancreatic necrosis, pancreatic ductal dilation or walled-off collections. Calcifications of the mitral annulus with left atrial dilation, consider further evaluation with cardiac echo if not previously performed. Mild pulmonary edema. Aortic Atherosclerosis. Twelve-lead EKG reviewed by me shows rapid A-fib Patient received 2 L IV fluid bolus, Cardizem 20 mg IV push x 1 and is currently on a Cardizem drip.  1/19.   Tapered off Cardizem drip and on oral metoprolol and oral Cardizem CD.  Started empiric antibiotics for pancreatitis since procalcitonin elevated and leukocytosis. 1/20.  Consulted general surgery with ?gallstone pancreatitis.  Surgery ordered for a repeat CAT scan.  Holding Eliquis and started heparin drip just in case surgery needed. 1/21.  Patient pulled out her IV and wanted to go home.  Patient was reoriented and then agreeable to stay and to surgery if needed.  Advanced diet to full liquids. 1/22.  Bilirubin rising, MRCP showed acute interstitial edematous pancreatitis with peripancreatic fluid collections with some signs of hemorrhagic or proteinaceous change.  Some peripancreatic necrosis seen.  Pancreatic ascites.  Rather striking tapered narrowing of the mid common bile duct with biliary enhancement.  Dilated main pancreatic duct.  Findings may reflect pancreatic divisum or dominant dorsal drainage via minor papilla. 1/23.  Dobbhoff tube had to be advanced twice.  Last x-ray showing in the duodenal bulb.  Okay to start tube feedings.  1/24: further K replacement.  Mental status worse again today.  Tube feeds running and appears to be tolerating okay.  Stopped antibiotics to monitor. 1/25: pt sleeping a lot. Fever 100.4 F last night, recurrent leukocytosis also.  Resumed Zosyn.  Replaced phos.  Sodium declining slowly. Cardiology gave IV Lasix. 1/26: repeated CT, has more pancreatic fluid collections but no necrosis. New finding of acute diverticulitis with contained perforation. Surgery re-consulted.  Starting TPN and continuing antibiotics  Assessment and Plan: * Severe acute pancreatitis Acute severe pancreatitis with increased fluid collections since prior CT but no evidence of necrosis on repeat CT (1/26).   On  empiric Zosyn - continuing for diverticulitis. General surgery no plan for cholecystectomy IgG4 pending.   The patient does not drink alcohol.  No new medications.   Triglycerides normal.  MRCP shows the possibility of pancreatic divisum which could increase risk of pancreatitis.   Lipase normalized pretty quickly. GI consulted, have signed off. Stopping tube feeds today and starting TPN due to acute diverticulitis.  Acute diverticulitis Repeated CT 1/26 to re-assess pancreatitis and patient now has sigmoid diverticulitis with contained perforation.  She has point tenderness of LLQ on exam, no peritoneal signs --General surgery re-consulted --Continue TPN --Continue Zosyn --NPO ex sips w meds or to wet mouth --Pain control, antiemetics PRN   Acute metabolic encephalopathy Improved after removing IV pain medications. 1/24 recurrent. Possibly due to needing more pain med yesterday --check ammonia level and VBG   Ammonia mildly elevated at 43. Given lactulose x 1 day  Continue lactulose if needed. Bowel regimen.  Atrial fibrillation with RVR (HCC) Chronic in nature.  Continue metoprolol and Cardizem CD.  Currently on heparin drip.  Situational anxiety Pt reports significant anxiety and family note she has physical shaking at times when anxious.  She reports some baseline anxiety, but severely exacerbated by  her current situation. --Low dose IV Valium PRN for anxiety.  Hypophosphatemia Phos 2.0 this AM, replacing. Monitor and further replace PRN.  Hyponatremia Improved with diuresis.  Intermittent IV Lasix PRN, assess daily Monitor BMP and volume status.  Hypokalemia K 4.3 today Monitor BMP  Acute pancreatitis See severe acute pancreatitis  Obesity (BMI 30-39.9) Body mass index is 33.45 kg/m. Complicates overall care and prognosis.  Recommend lifestyle modifications including physical activity and diet for weight loss and overall long-term health.   HOCM (hypertrophic obstructive cardiomyopathy) ([place]) Patient with a history of HOCM Continue metoprolol.   Hypothyroidism Continue Synthroid  GERD (gastroesophageal reflux  disease) Continue PPI        Subjective: Patient seen with daughter at bedside this AM.   Pt tried valium for anxiety yesterday and it helped, but second dose in the evening made her unable to sleep and felt strange, opposite effect from first dose.  She does not want to try in again, or anything for anxiety for now.  She reports some improvement in LLQ pain today.  She is having very frequent urination from Lasix this AM.  No other acute complaints.   Physical Exam: Vitals:   09/25/22 1400 09/25/22 1414 09/25/22 1500 09/25/22 1548  BP:    107/68  Pulse:    64  Resp: (!) 25 18 19 20   Temp:    97.9 F (36.6 C)  TempSrc:    Oral  SpO2:    94%  Weight:      Height:       General exam: awake, alert, no acute distress, obese HEENT: Dobhoff NG tube in place with tube feeds off, moist mucus membranes, hearing grossly normal  Respiratory system: on room air, normal respiratory effort. Lungs clear Cardiovascular system: RRR, mild nonpitting LE edema   Gastrointestinal system: little less distended, mildly improved LLQ point tenderness today, no rebound tenderness or peritoneal signs Central nervous system: A&Ox4, no gross focal neurologic deficits, normal speech Extremities: moves all, no edema, normal tone Skin: dry, intact, normal temperature Psychiatry: normal mood, congruent affect, judgement and insight appear normal     Data Reviewed: Notable labs --  Na 132, glucose 157, WBC improved 16.2 from 18.3k   Repeat CT abdomen/pelvis with IV contrast 1/26  AM --- new sigmoid diverticulitis and more pancreatic fluid collections, but none are rim enhancing, no evidence of necrosis. IMPRESSION: "1. Interval changes of acute diverticulitis involving the proximal sigmoid colon with a contained perforation at this level containing fluid and gas laterally, measuring 3.1 x 1.6 x 2.4 cm. There is an adjacent tiny collection of extraluminal gas. 2. Increased peripancreatic edema and fluid  including multiple developing fluid collections without surrounding rim enhancement at this time and no evidence of pancreatic necrosis. 3. Small bilateral pleural effusions, decreased on the right and increased on the left. 4. Small amount of free peritoneal fluid, decreased. 5. Marked left atrial enlargement.  "   Family Communication: Spoke with daughter at the bedside on rounds this AM.  Disposition: Status is: Inpatient Remains inpatient appropriate because: On TPN, IV antibiotics.  Pt remains severely ill at this time as outlined above.   Planned Discharge Destination: Home with Home Health    Time spent: 36 minutes  Author: Pennie Banter, DO 09/25/2022 4:57 PM  For on call review [email or web address].

## 2022-09-26 ENCOUNTER — Inpatient Hospital Stay: Payer: BC Managed Care – PPO

## 2022-09-26 ENCOUNTER — Encounter: Payer: Self-pay | Admitting: Internal Medicine

## 2022-09-26 DIAGNOSIS — K859 Acute pancreatitis without necrosis or infection, unspecified: Secondary | ICD-10-CM | POA: Diagnosis not present

## 2022-09-26 DIAGNOSIS — K5720 Diverticulitis of large intestine with perforation and abscess without bleeding: Secondary | ICD-10-CM | POA: Diagnosis not present

## 2022-09-26 LAB — COMPREHENSIVE METABOLIC PANEL
ALT: 28 U/L (ref 0–44)
AST: 24 U/L (ref 15–41)
Albumin: 2.6 g/dL — ABNORMAL LOW (ref 3.5–5.0)
Alkaline Phosphatase: 81 U/L (ref 38–126)
Anion gap: 11 (ref 5–15)
BUN: 17 mg/dL (ref 8–23)
CO2: 23 mmol/L (ref 22–32)
Calcium: 8.7 mg/dL — ABNORMAL LOW (ref 8.9–10.3)
Chloride: 100 mmol/L (ref 98–111)
Creatinine, Ser: 0.78 mg/dL (ref 0.44–1.00)
GFR, Estimated: >60 mL/min (ref >60)
Glucose, Bld: 156 mg/dL — ABNORMAL HIGH (ref 70–99)
Potassium: 4.3 mmol/L (ref 3.5–5.1)
Sodium: 134 mmol/L — ABNORMAL LOW (ref 135–145)
Total Bilirubin: 0.9 mg/dL (ref 0.3–1.2)
Total Protein: 6.7 g/dL (ref 6.5–8.1)

## 2022-09-26 LAB — GLUCOSE, CAPILLARY
Glucose-Capillary: 161 mg/dL — ABNORMAL HIGH (ref 70–99)
Glucose-Capillary: 183 mg/dL — ABNORMAL HIGH (ref 70–99)
Glucose-Capillary: 149 mg/dL — ABNORMAL HIGH (ref 70–99)

## 2022-09-26 LAB — PHOSPHORUS: Phosphorus: 3.7 mg/dL (ref 2.5–4.6)

## 2022-09-26 LAB — TRIGLYCERIDES: Triglycerides: 118 mg/dL (ref <150)

## 2022-09-26 LAB — CBC
HCT: 35.1 % — ABNORMAL LOW (ref 36.0–46.0)
Hemoglobin: 11.4 g/dL — ABNORMAL LOW (ref 12.0–15.0)
MCH: 28.8 pg (ref 26.0–34.0)
MCHC: 32.5 g/dL (ref 30.0–36.0)
MCV: 88.6 fL (ref 80.0–100.0)
Platelets: 357 10*3/uL (ref 150–400)
RBC: 3.96 MIL/uL (ref 3.87–5.11)
RDW: 14.9 % (ref 11.5–15.5)
WBC: 16 10*3/uL — ABNORMAL HIGH (ref 4.0–10.5)
nRBC: 0 % (ref 0.0–0.2)

## 2022-09-26 LAB — HEPARIN LEVEL (UNFRACTIONATED)
Heparin Unfractionated: 0.25 IU/mL — ABNORMAL LOW (ref 0.30–0.70)
Heparin Unfractionated: 0.41 IU/mL (ref 0.30–0.70)
Heparin Unfractionated: 0.54 IU/mL (ref 0.30–0.70)

## 2022-09-26 LAB — MAGNESIUM: Magnesium: 2.3 mg/dL (ref 1.7–2.4)

## 2022-09-26 MED ORDER — TRAVASOL 10 % IV SOLN
INTRAVENOUS | Status: AC
  Filled 2022-09-26: qty 1075.2

## 2022-09-26 MED ORDER — HEPARIN BOLUS VIA INFUSION
1300.0000 [IU] | Freq: Once | INTRAVENOUS | Status: AC
  Administered 2022-09-26: 1300 [IU] via INTRAVENOUS
  Filled 2022-09-26: qty 1300

## 2022-09-26 MED ORDER — SACCHAROMYCES BOULARDII 250 MG PO CAPS
250.0000 mg | ORAL_CAPSULE | Freq: Two times a day (BID) | ORAL | Status: DC
  Administered 2022-09-26 – 2022-10-05 (×19): 250 mg via ORAL
  Filled 2022-09-26 (×20): qty 1

## 2022-09-26 MED ORDER — SENNOSIDES-DOCUSATE SODIUM 8.6-50 MG PO TABS
1.0000 | ORAL_TABLET | Freq: Two times a day (BID) | ORAL | Status: DC
  Administered 2022-09-26 – 2022-10-02 (×13): 1 via ORAL
  Filled 2022-09-26 (×14): qty 1

## 2022-09-26 MED ORDER — IOHEXOL 9 MG/ML PO SOLN
500.0000 mL | ORAL | Status: AC
  Administered 2022-09-26 (×2): 500 mL via ORAL

## 2022-09-26 MED ORDER — IOHEXOL 300 MG/ML  SOLN
100.0000 mL | Freq: Once | INTRAMUSCULAR | Status: AC | PRN
  Administered 2022-09-26: 100 mL via INTRAVENOUS

## 2022-09-26 NOTE — Progress Notes (Signed)
[facility] Cardiology  Patient ID: Molly Brown MRN: [id number] DOB/AGE: 70-Aug-1952 70 y.o.   Admit date: 09/15/2022 Referring Physician Dr. Francine Graven  Primary Physician  Primary Cardiologist Dr. Nehemiah Massed Reason for Consultation rapid atrial fibrillation  HPI: Molly Brown is a 72YOF with a PMH of HOCM (EF >55% 12/2021), chronic AF on eliquis, OSA, HLD, who presented to [facility] ED 09/15/2022 with sudden onset abdominal pain x 1 day.  She was in AF RVR on presentation, CT abd/pelv concerning for acute interstitial pancreatitis, MRCP without choledocholithiasis, severe necrotizing pancreatitis, with repeat imaging 1/26 showing acute diverticulitis + small perforation/abscess. Cardiology is following for assistance with the patient's atrial fibrillation, which has been well controlled over the past several days.  Interval History: - feels restless / can't get comfortable in bed. Abdominal pain reasonably controlled with narcotics. No shortness of breath, chest pain, palpitations. - remain in AF with rate controlled in 70s-80s - leukocytosis stable at 16K,   Vitals:   09/25/22 2033 09/25/22 2355 09/26/22 0544 09/26/22 0847  BP: (!) 150/78 128/76 106/64 115/70  Pulse: 86 84 84 73  Resp: 18 (!) 24 19 15   Temp: 97.9 F (36.6 C) 97.9 F (36.6 C) 98.1 F (36.7 C) (!) 97.5 F (36.4 C)  TempSrc:      SpO2: 100% 99% 99% 100%  Weight:      Height:         Intake/Output Summary (Last 24 hours) at 09/26/2022 1034 Last data filed at 09/26/2022 0314 Gross per 24 hour  Intake 875.82 ml  Output 550 ml  Net 325.82 ml     PHYSICAL EXAM General: ill-appearing Caucasian female,  in no acute distress. Sitting upright in bed with husband at bedside HEENT:  Normocephalic and atraumatic. NGT in place Neck:   No JVD.  Lungs: Normal work of breathing on room air. Trace crackles right base. Heart: Irregularly irregular with controlled rate.  Normal S1 and S2 .  Blowing holosystolic murmur heard throughout Abdomen:  Mildly-distended appearing.  Msk: Normal strength and tone for age. Extremities: No clubbing, cyanosis. Generally edematous upper and lower extremities Neuro: Alert and oriented x3,  Psych: frustrated mood. cooperative   LABS: Basic Metabolic Panel: Recent Labs    09/25/22 0507 09/26/22 0522  NA 132* 134*  K 4.3 4.3  CL 104 100  CO2 25 23  GLUCOSE 157* 156*  BUN 12 17  CREATININE 0.76 0.78  CALCIUM 8.3* 8.7*  MG 2.3 2.3  PHOS 3.6 3.7    Liver Function Tests: Recent Labs    09/24/22 0413 09/26/22 0522  AST 35 24  ALT 36 28  ALKPHOS 76 81  BILITOT 1.0 0.9  PROT 5.9* 6.7  ALBUMIN 2.2* 2.6*    No results for input(s): "LIPASE", "AMYLASE" in the last 72 hours.  CBC: Recent Labs    09/25/22 0507 09/26/22 0522  WBC 16.2* 16.0*  HGB 10.2* 11.4*  HCT 31.6* 35.1*  MCV 88.5 88.6  PLT 299 357    Cardiac Enzymes: No results for input(s): "CKTOTAL", "CKMB", "CKMBINDEX", "TROPONINI" in the last 72 hours. BNP: Invalid input(s): "POCBNP" D-Dimer: No results for input(s): "DDIMER" in the last 72 hours. Hemoglobin A1C: No results for input(s): "HGBA1C" in the last 72 hours. Fasting Lipid Panel: Recent Labs    09/26/22 0530  TRIG 118    Thyroid Function Tests: No results for input(s): "TSH", "T4TOTAL", "T3FREE", "THYROIDAB" in the last 72 hours.  Invalid input(s): "FREET3" Anemia Panel: No results for input(s): "VITAMINB12", "FOLATE", "FERRITIN", "TIBC", "  IRON", "RETICCTPCT" in the last 72 hours.  No results found.   Echo EF>55%, asymmetric septal hypertrophy consistent with hypertrophic obstructive cardiomyopathy, moderate mitral regurgitation  TELEMETRY: Atrial fibrillation 70s-80s  ASSESSMENT AND PLAN:  Principal Problem:   Severe acute pancreatitis Active Problems:   GERD (gastroesophageal reflux disease)   Hypothyroidism   Atrial fibrillation with RVR (HCC)   HOCM (hypertrophic obstructive cardiomyopathy) (HCC)   Obesity (BMI 30-39.9)    Acute metabolic encephalopathy   Sterile pancreatic necrosis   Acute pancreatitis   Hypokalemia   Hyponatremia   Hypophosphatemia   Diverticulitis of colon   Acute diverticulitis   Situational anxiety    1. Chronic atrial fibrillation, mildly elevated heart rate in the setting of pancreatitis, on Eliquis for stroke prevention, switched to heparin infusion in case of potential surgical procedure, s/p diltiazem infusion, now on Cardizem CD 180 mg daily and metoprolol tartrate 50 mg twice daily for rate control. 2.  Acute necrotizing pancreatitis, currently managed conservatively, on IV zosyn, Dobbhoff tube placed 1/23 for enteral feeds 3.  Acute diverticulitis w/ contained perforation/small abscess, surgery team repeating CT abd/pelv today 4.  Hypertrophic obstructive cardiomyopathy, asymmetric septal hypertrophy (SWT 2.0 cm ) observed on 2D echocardiogram 12/30/2021.  Euvolemic on room air 1/26   Recommendations   1.  Agree with current therapy 2.  Continue heparin now. Restart Eliquis 5mg  BID once certain no need for surgery or at discharge.  3.  Continue Cardizem CD 180mg  CD, metoprolol tartrate 50mg  BID  4.  S/p IV Lasix 40 mg x 1 on 1/22, 1/25. Hold further dosing for now 5.  Continue to treat causes of increased adrenergic tone including pain, electrolyte disturbances, hypoxia, fever, etc 5.  Follow-up with Dr. Clayborn Bigness after discharge, will arrange for this in 1-2 weeks.  Cardiology will sign off. Please haiku with questions or re-engage if needed.    This patient's plan of care was discussed and created with Dr. Clayborn Bigness and he is in agreement.    Tristan Schroeder, PA-C 09/26/2022 10:34 AM

## 2022-09-26 NOTE — Progress Notes (Signed)
Physical Therapy Treatment Patient Details Name: Molly Brown MRN: [id number] DOB: [birth year] Today's Date: 09/26/2022   History of Present Illness Pt is a 72 y.o. 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  MD assessment includes: Acute metabolic encephalopathy, hypokalemia, and hypertrophic obstructive cardiomyopathy.    PT Comments    Pt somewhat lethargic but easily awoken with verbal stimulation and put forth good effort with below therex with frequent therapeutic rest breaks given.  Session limited by multiple providers entering at end of session for blood work and CT prep.  Pt will benefit from HHPT upon discharge to safely address deficits listed in patient problem list for decreased caregiver assistance and eventual return to PLOF.    Recommendations for follow up therapy are one component of a multi-disciplinary discharge planning process, led by the attending physician.  Recommendations may be updated based on patient status, additional functional criteria and insurance authorization.  Follow Up Recommendations  Home health PT     Assistance Recommended at Discharge Frequent or constant Supervision/Assistance  Patient can return home with the following A little help with walking and/or transfers;A little help with bathing/dressing/bathroom;Assistance with cooking/housework;Assist for transportation;Help with stairs or ramp for entrance   Equipment Recommendations  None recommended by PT    Recommendations for Other Services       Precautions / Restrictions Precautions Precautions: Fall Restrictions Weight Bearing Restrictions: No Other Position/Activity Restrictions: HOB to >/= 30 deg     Mobility  Bed Mobility               General bed mobility comments: Supine therex  only this session secondary to pt with multiple providers present at end of session for blood work and CT prep    Transfers                        Ambulation/Gait                   Stairs             Wheelchair Mobility    Modified Rankin (Stroke Patients Only)       Balance                                            Cognition Arousal/Alertness: Lethargic Behavior During Therapy: WFL for tasks assessed/performed Overall Cognitive Status: Within Functional Limits for tasks assessed                                          Exercises Total Joint Exercises Ankle Circles/Pumps: Strengthening, Both, 10 reps, 15 reps (with manual resistance) Quad Sets: Strengthening, Both, 10 reps, 15 reps Heel Slides: Strengthening, Both, 10 reps Hip ABduction/ADduction: Strengthening, Both, 10 reps (isometric) Straight Leg Raises: Strengthening, Both, AAROM, 10 reps    General Comments        Pertinent Vitals/Pain Pain Assessment Pain Assessment: No/denies pain    Home Living                          Prior Function  PT Goals (current goals can now be found in the care plan section) Progress towards PT goals: PT to reassess next treatment    Frequency           PT Plan Current plan remains appropriate    Co-evaluation              AM-PAC PT "6 Clicks" Mobility   Outcome Measure  Help needed turning from your back to your side while in a flat bed without using bedrails?: A Little Help needed moving from lying on your back to sitting on the side of a flat bed without using bedrails?: A Little Help needed moving to and from a bed to a chair (including a wheelchair)?: A Little Help needed standing up from a chair using your arms (e.g., wheelchair or bedside chair)?: A Little Help needed to walk in hospital room?: A Little Help needed climbing 3-5 steps with a railing? : A Lot 6 Click  Score: 17    End of Session   Activity Tolerance: Patient tolerated treatment well Patient left: in bed;with call bell/phone within reach;with bed alarm set;with family/visitor present;with nursing/sitter in room Nurse Communication: Mobility status PT Visit Diagnosis: Muscle weakness (generalized) (M62.81);Difficulty in walking, not elsewhere classified (R26.2);Pain Pain - part of body:  (abdomen)     Time: 2841-3244 PT Time Calculation (min) (ACUTE ONLY): 25 min  Charges:  $Therapeutic Exercise: 8-22 mins                    D. Royetta Asal PT, DPT 09/26/22, 3:47 PM

## 2022-09-26 NOTE — Consult Note (Addendum)
PHARMACY - TOTAL PARENTERAL NUTRITION CONSULT NOTE   Indication: Prolonged ileus  Patient Measurements: Height: 5\' 8"  (172.7 cm) Weight: 109.5 kg (241 lb 4.8 oz) IBW/kg (Calculated) : 63.9 TPN AdjBW (KG): 72.9 Body mass index is 36.69 kg/m.  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 Patient subsequently admitted for acute pancreatitis. Hospital course complicated by development of diverticulitis with contained perforation. Enteric feedings placed on hold. Pharmacy consulted to initiate and manage TPN for prolonged ileus.   Glucose / Insulin:  --Hgb A1c on 05/31/22 was 6% (pre-diabetes) --Patient has been mildly hyperglycemic on tube feeds but within goal range -BG  156-161 SSI per 24 hrs: 12 units  1/27: TG=162 > 118 Electrolytes:  -- Na 132 > 134.  Renal:  --Scr < 1 Hepatic:  --Overall within normal limits, hyperbilirubinemia resolved Intake / Output; MIVF:  --I&Os have not been charted strictly --No MIVF GI Imaging: 1/26 CT abdomen / pelvis: Interval changes of acute diverticulitis involving the proximal sigmoid colon with a contained perforation. Increased peripancreatic edema and fluid including multiple developing fluid collections without surrounding rim enhancement at this time and no evidence of pancreatic necrosis. GI Surgeries / Procedures: N/A  Central access: PICC placed 1/26 TPN start date: 1/26  Nutritional Goals: Goal TPN rate is 80 mL/hr (provides 107 g of protein and 2050 kcals per day)  RD Assessment: Estimated Needs Total Energy Estimated Needs: 2050-2250 Total Protein Estimated Needs: 105-120 grams Total Fluid Estimated Needs: > 2 L  Current Nutrition:  TPN Tube feed stopped 1/26 -strict NPO/bowel rest, PO meds being given  Plan:  --Will continue goal TPN rate of 80 mL/hr at 1800 --Electrolytes in TPN: Na 31mEq/L, K 47mEq/L, Ca  4mEq/L, Mg 58mEq/L, and Phos 27mmol/L. Cl:Ac 1:1 --Add standard MVI and trace elements to TPN, s/p thiamine 100 mg x 3 days  -discontinue po MVI for now -1/27: TG 162 --Initiate Moderate q6h SSI and adjust as needed  --Monitor TPN labs 1st 3 days then on Mon/Thurs, daily until stable  Oswald Hillock, PharmD, BCPS 09/26/2022,10:09 AM

## 2022-09-26 NOTE — Progress Notes (Addendum)
[facility] SURGICAL PROGRESS NOTE (cpt [id number])  Hospital Day(s): 11.   Post op day(s): 8 Days Post-Op.   Interval History:  Patient seen and examined No issues overnight Patient continues to have LLQ abdominal pain; slightly improved compared to my previous examination NO fever, chills, nausea, emesis  Stable appearing leukocytosis; 16.0K Hgb stable to 11.4 Renal function normal; sCr - 0.78; UO - 550 ccs No significant electrolyte derangements Continues on Zosyn NPO w/ TPN  Review of Systems:  Constitutional: denies fever, chills  HEENT: denies cough or congestion  Respiratory: denies any shortness of breath  Cardiovascular: denies chest pain or palpitations  Gastrointestinal: + abdominal pain (LLQ; seems to be improving), denied N/V Genitourinary: denies burning with urination or urinary frequency Musculoskeletal: denies pain, decreased motor or sensation  Vital signs in last 24 hours: [min-max] current  Temp:  [97.8 F (36.6 C)-98.1 F (36.7 C)] 98.1 F (36.7 C) (01/29 0544) Pulse Rate:  [64-92] 84 (01/29 0544) Resp:  [15-25] 19 (01/29 0544) BP: (106-150)/(64-81) 106/64 (01/29 0544) SpO2:  [94 %-100 %] 99 % (01/29 0544)     Height: 5\' 8"  (172.7 cm) Weight: 109.5 kg BMI (Calculated): 36.7   Intake/Output last 2 shifts:  01/28 0701 - 01/29 0700 In: 1188 [I.V.:1134.4; IV Piggyback:53.7] Out: 550 [Urine:550]   Physical Exam:  Constitutional: alert, cooperative and no distress  HENT: normocephalic without obvious abnormality, Dobbhoff in place Eyes: PERRL, EOM's grossly intact and symmetric  Respiratory: breathing non-labored at rest  Cardiovascular: regular rate and sinus rhythm  Gastrointestinal: soft, she is point tender in LLQ but seems improved some, non-distended, no rebound/guarding. She is not overtly peritonitic nor toxic  Musculoskeletal: no edema or wounds, motor and sensation grossly intact, NT    Labs:     Latest Ref Rng & Units  09/26/2022    5:22 AM 09/25/2022    5:07 AM 09/24/2022    4:13 AM  CBC  WBC 4.0 - 10.5 K/uL 16.0  16.2  18.3   Hemoglobin 12.0 - 15.0 g/dL 11.4  10.2  10.1   Hematocrit 36.0 - 46.0 % 35.1  31.6  30.0   Platelets 150 - 400 K/uL 357  299  276       Latest Ref Rng & Units 09/26/2022    5:22 AM 09/25/2022    5:07 AM 09/24/2022    4:13 AM  CMP  Glucose 70 - 99 mg/dL 156  157  135   BUN 8 - 23 mg/dL 17  12  11    Creatinine 0.44 - 1.00 mg/dL 0.78  0.76  0.72   Sodium 135 - 145 mmol/L 134  132  133   Potassium 3.5 - 5.1 mmol/L 4.3  4.3  3.9   Chloride 98 - 111 mmol/L 100  104  98   CO2 22 - 32 mmol/L 23  25  27    Calcium 8.9 - 10.3 mg/dL 8.7  8.3  8.4   Total Protein 6.5 - 8.1 g/dL 6.7   5.9   Total Bilirubin 0.3 - 1.2 mg/dL 0.9   1.0   Alkaline Phos 38 - 126 U/L 81   76   AST 15 - 41 U/L 24   35   ALT 0 - 44 U/L 28   36      Imaging studies:  No new imaging studies     Assessment/Plan: (ICD-10's: K57.32) 72 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   -  Will plan for repeat CT Abdomen/Pelvis today to reassess intra-abdominal process. Okay to utilize Dobbhoff to give PO contrast   - Continue NPO for now; Okay for sips with meds; water/ice for comfort  - Continue TPN for now; at goal   - Hold enteric feedings  - Continue IV Abx (Zosyn) - No need for emergent surgical intervention currently. She understands that should she fail to improve or clinically deteriorate, we may need to consider intervention which would likely result in temporizing colostomy.   - Monitor abdominal examination; on-going bowel function - Monitor leukocytosis; stable - Pain control prn; antiemetics prn - Okay to mobilize as tolerated/feasible  - Further management per primary service; we will follow    All of the above findings and recommendations were discussed with the patient, patient's family, and the medical team, and all of  patient's and family's questions were answered to their expressed satisfaction.  -- Edison Simon, PA-C [facility] 09/26/2022, 7:28 AM M-F: 7am - 4pm

## 2022-09-26 NOTE — Progress Notes (Signed)
Subjective:  CC: Molly Brown is a 72 y.o. female  Hospital stay day 11,  necrotizing pancreatitis now found to have diverticulitis with contained perforation/small abscess.   HPI: No acute issues reported overnight.  ROS:  General: Denies weight loss, weight gain, fatigue, fevers, chills, and night sweats. Heart: Denies chest pain, palpitations, racing heart, irregular heartbeat, leg pain or swelling, and decreased activity tolerance. Respiratory: Denies breathing difficulty, shortness of breath, wheezing, cough, and sputum. GI: Denies change in appetite, heartburn, nausea, vomiting, constipation, diarrhea, and blood in stool. GU: Denies difficulty urinating, pain with urinating, urgency, frequency, blood in urine.   Objective:   Temp:  [97.5 F (36.4 C)-98.1 F (36.7 C)] 97.5 F (36.4 C) (01/29 0847) Pulse Rate:  [64-92] 73 (01/29 0847) Resp:  [15-25] 15 (01/29 0847) BP: (106-150)/(64-81) 115/70 (01/29 0847) SpO2:  [94 %-100 %] 100 % (01/29 0847)     Height: 5\' 8"  (172.7 cm) Weight: 109.5 kg BMI (Calculated): 36.7   Intake/Output this shift:   Intake/Output Summary (Last 24 hours) at 09/26/2022 1045 Last data filed at 09/26/2022 0314 Gross per 24 hour  Intake 875.82 ml  Output 550 ml  Net 325.82 ml    Constitutional :  alert, cooperative, appears stated age, and no distress  Respiratory:  clear to auscultation bilaterally  Cardiovascular:  regular rate and rhythm  Gastrointestinal: Soft, no guarding, tenderness to palpation left lower quadrant no significant change.  Dobbhoff tube in place. .   Skin: Cool and moist.   Psychiatric: Normal affect, non-agitated, not confused       LABS:     Latest Ref Rng & Units 09/26/2022    5:22 AM 09/25/2022    5:07 AM 09/24/2022    4:13 AM  CMP  Glucose 70 - 99 mg/dL 156  157  135   BUN 8 - 23 mg/dL 17  12  11    Creatinine 0.44 - 1.00 mg/dL 0.78  0.76  0.72   Sodium 135 - 145 mmol/L 134  132  133   Potassium 3.5 - 5.1 mmol/L 4.3   4.3  3.9   Chloride 98 - 111 mmol/L 100  104  98   CO2 22 - 32 mmol/L 23  25  27    Calcium 8.9 - 10.3 mg/dL 8.7  8.3  8.4   Total Protein 6.5 - 8.1 g/dL 6.7   5.9   Total Bilirubin 0.3 - 1.2 mg/dL 0.9   1.0   Alkaline Phos 38 - 126 U/L 81   76   AST 15 - 41 U/L 24   35   ALT 0 - 44 U/L 28   36       Latest Ref Rng & Units 09/26/2022    5:22 AM 09/25/2022    5:07 AM 09/24/2022    4:13 AM  CBC  WBC 4.0 - 10.5 K/uL 16.0  16.2  18.3   Hemoglobin 12.0 - 15.0 g/dL 11.4  10.2  10.1   Hematocrit 36.0 - 46.0 % 35.1  31.6  30.0   Platelets 150 - 400 K/uL 357  299  276     RADS: N/a Assessment:   necrotizing pancreatitis now found to have diverticulitis with contained perforation/small abscess.   Stable.  Recommend continuing IV antibiotics, bowel rest with strict n.p.o., TPN, pain control.  Briefly discussed pathophysiology of diverticulitis as well.  All questions addressed.  labs/images/medications/previous chart entries reviewed personally and relevant changes/updates noted above.

## 2022-09-26 NOTE — Progress Notes (Signed)
Nutrition Follow-up  DOCUMENTATION CODES:   Obesity unspecified  INTERVENTION:   -TF currently on hold secondary to diverticulitis with perforation -TPN management per pharmacy  NUTRITION DIAGNOSIS:   Increased nutrient needs related to acute illness (pancreatitis) as evidenced by estimated needs.  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  1/23- post pyloric feeding tube placed (tip past duodenal bulb per x-ray)    1/26- TF held secondary to findings of diverticulitis with perforation, TPN initiated  Reviewed I/O's: +638 ml x 24 hours and +1.8 L since admission  UOP: 500 ml x 24 hours  Pt lying in bed at time of visit. Noted sleeping soundly with TF off.    Per general surgery notes, plan to keep NPO and hold TF today. Plan for CT scan to evaluate diverticulitis.   Pt remains on TPN; receiving at goal rate of 80 ml/hr, which provides 2050 kcals and 108 grams protein, meeting 100% of estimated nutritional needs.   Medications reviewed and include vitamin B-12 and cardizem cd.   Labs reviewed: Na: 134, CBGS: 153-161 (inpatient orders for glycemic control are 0-15 units insulin aspart every 6 hours).    Diet Order:   Diet Order             Diet NPO time specified Except for: Sips with Meds  Diet effective now                   EDUCATION NEEDS:   Education needs have been addressed  Skin:  Skin Assessment: Reviewed RN Assessment  Last BM:  09/23/22  Height:   Ht Readings from Last 1 Encounters:  09/15/22 5\' 8"   (1.727 m)    Weight:   Wt Readings from Last 1 Encounters:  09/23/22 109.5 kg    Ideal Body Weight:  63.6 kg  BMI:  Body mass index is 36.69 kg/m.  Estimated Nutritional Needs:   Kcal:  2050-2250  Protein:  105-120 grams  Fluid:  > 2 L    Loistine Chance, RD, LDN, [place] Registered Dietitian II Certified Diabetes Care and Education Specialist Please refer to [facility] for RD and/or RD on-call/weekend/after hours pager

## 2022-09-26 NOTE — Progress Notes (Signed)
Progress Note   Patient: Molly Brown HGD:[id number] DOB: 02-Mar-1951 DOA: 09/15/2022     11 DOS: the patient was seen and examined on 09/26/2022   Brief hospital course: 72 y.o. 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 Patient presents for evaluation of sudden onset abdominal pain mostly in the epigastrium, periumbilical area.  She rates her pain a 10 x 10 in intensity at its worst.  Pain is nonradiating and is associated with nausea and multiple episodes of emesis.  She states that she has had issues with abdominal pain in the past usually self-limiting but this is the worst pain she has had which prompted her visit to the emergency room.  She is unable to tell me if her prior episodes of pain related to meals.  She has urinary frequency and has dyspnea with exertion which is chronic.  Her last bowel movement was 2 days prior to this admission.  According to the patient she usually has daily bowel movements She denies having any fever, no chills, no chest pain, no headache, no dizziness, no lightheadedness, no cough, no blurred vision no focal deficit. Abnormal labs include lactic acid of 2.0, lipase 539, troponin 24, white count 15.8 CT scan of abdomen and pelvis shows acute interstitial pancreatitis. No evidence of pancreatic necrosis, pancreatic ductal dilation or walled-off collections. Calcifications of the mitral annulus with left atrial dilation, consider further evaluation with cardiac echo if not previously performed. Mild pulmonary edema. Aortic Atherosclerosis. Twelve-lead EKG reviewed by me shows rapid A-fib Patient received 2 L IV fluid bolus, Cardizem 20 mg IV push x 1 and is currently on a Cardizem drip.  1/19.   Tapered off Cardizem drip and on oral metoprolol and oral Cardizem CD.  Started empiric antibiotics for pancreatitis since procalcitonin elevated and leukocytosis. 1/20.  Consulted general surgery with ?gallstone pancreatitis.  Surgery ordered for a repeat CAT scan.  Holding Eliquis and started heparin drip just in case surgery needed. 1/21.  Patient pulled out her IV and wanted to go home.  Patient was reoriented and then agreeable to stay and to surgery if needed.  Advanced diet to full liquids. 1/22.  Bilirubin rising, MRCP showed acute interstitial edematous pancreatitis with peripancreatic fluid collections with some signs of hemorrhagic or proteinaceous change.  Some peripancreatic necrosis seen.  Pancreatic ascites.  Rather striking tapered narrowing of the mid common bile duct with biliary enhancement.  Dilated main pancreatic duct.  Findings may reflect pancreatic divisum or dominant dorsal drainage via minor papilla. 1/23.  Dobbhoff tube had to be advanced twice.  Last x-ray showing in the duodenal bulb.  Okay to start tube feedings.  1/24: further K replacement.  Mental status worse again today.  Tube feeds running and appears to be tolerating okay.  Stopped antibiotics to monitor. 1/25: pt sleeping a lot. Fever 100.4 F last night, recurrent leukocytosis also.  Resumed Zosyn.  Replaced phos.  Sodium declining slowly. Cardiology gave IV Lasix. 1/26: repeated CT, has more pancreatic fluid collections but no necrosis. New finding of acute diverticulitis with contained perforation. Surgery re-consulted.  Starting TPN and continuing antibiotics  Assessment and Plan: * Severe acute pancreatitis Acute severe pancreatitis with increased fluid collections since prior CT but no evidence of necrosis on repeat CT (1/26).   On  empiric Zosyn - continuing for diverticulitis. General surgery no plan for cholecystectomy IgG4 pending.   The patient does not drink alcohol.  No new medications.   Triglycerides normal.  MRCP shows the possibility of pancreatic divisum which could increase risk of pancreatitis.   Lipase normalized pretty quickly. GI consulted, have signed off. Stopping tube feeds today and starting TPN due to acute diverticulitis.  Acute diverticulitis Repeated CT 1/26 to re-assess pancreatitis and patient now has sigmoid diverticulitis with contained perforation.  She has point tenderness of LLQ on exam, no peritoneal signs --General surgery re-consulted --Continue TPN --Continue Zosyn --NPO ex sips w meds or to wet mouth --Pain control, antiemetics PRN   Acute metabolic encephalopathy Improved after removing IV pain medications. 1/24 recurrent. Possibly due to needing more pain med yesterday --check ammonia level and VBG   Ammonia mildly elevated at 43. Given lactulose x 1 day  Continue lactulose if needed. Bowel regimen.  Atrial fibrillation with RVR (HCC) Chronic in nature.  Continue metoprolol and Cardizem CD.  Currently on heparin drip.  Situational anxiety Pt reports significant anxiety and family note she has physical shaking at times when anxious.  She reports some baseline anxiety, but severely exacerbated by  her current situation. --Low dose IV Valium PRN for anxiety.  Hypophosphatemia Phos 3.7 today. Monitor and further replace PRN.  Hyponatremia Improved with diuresis.  Intermittent IV Lasix PRN, assess daily Monitor BMP and volume status.  Hypokalemia K 4.3 today Monitor BMP  Acute pancreatitis See severe acute pancreatitis  Obesity (BMI 30-39.9) Body mass index is 33.45 kg/m. Complicates overall care and prognosis.  Recommend lifestyle modifications including physical activity and diet for weight loss and overall long-term health.   HOCM (hypertrophic obstructive cardiomyopathy) (HCC) Patient with a history of HOCM Continue metoprolol.   Hypothyroidism Continue Synthroid  GERD (gastroesophageal reflux  disease) Continue PPI        Subjective: Patient up in recliner, husband at bedside this AM.  She reports some improvement in abdominal pain.  She was up in chair a couple hours yesterday and some overnight.  Starting to feel better and motivated to be out of the bed.  Reports urinary frequency improved once Lasix out of her.  Constipated.   Physical Exam: Vitals:   09/25/22 2355 09/26/22 0544 09/26/22 0847 09/26/22 1237  BP: 128/76 106/64 115/70 (!) 144/73  Pulse: 84 84 73 84  Resp: (!) 24 19 15 17   Temp: 97.9 F (36.6 C) 98.1 F (36.7 C) (!) 97.5 F (36.4 C) 97.6 F (36.4 C)  TempSrc:      SpO2: 99% 99% 100% 100%  Weight:      Height:       General exam: awake, alert, no acute distress, obese HEENT: Dobhoff NG tube in place with tube feeds off, moist mucus membranes, hearing grossly normal  Respiratory system: on room air, normal respiratory effort. Lungs clear Cardiovascular system: RRR, mild nonpitting LE edema   Gastrointestinal system: less distended, further improvement in LLQ point tenderness, no rebound tenderness or peritoneal signs Central nervous system: A&Ox4, no gross focal neurologic deficits, normal speech Extremities: moves all, no edema, normal tone Skin: dry, intact, normal temperature Psychiatry: normal mood, congruent affect, judgement and insight appear normal     Data Reviewed: Notable labs --  Na 134, glucose 156, Ca 8.7, albumin 2.6, WBC 16.0 from 16.2, Hbg 11.4 from 10.2   Repeat CT abdomen/pelvis with IV contrast 1/26  AM --- new sigmoid diverticulitis and more pancreatic  fluid collections, but none are rim enhancing, no evidence of necrosis. IMPRESSION: "1. Interval changes of acute diverticulitis involving the proximal sigmoid colon with a contained perforation at this level containing fluid and gas laterally, measuring 3.1 x 1.6 x 2.4 cm. There is an adjacent tiny collection of extraluminal gas. 2. Increased peripancreatic edema and fluid  including multiple developing fluid collections without surrounding rim enhancement at this time and no evidence of pancreatic necrosis. 3. Small bilateral pleural effusions, decreased on the right and increased on the left. 4. Small amount of free peritoneal fluid, decreased. 5. Marked left atrial enlargement.  "   Family Communication: Updated husband at the bedside on rounds this AM.  Disposition: Status is: Inpatient Remains inpatient appropriate because: On TPN, IV antibiotics.  Pt remains severely ill at this time as outlined above.   Planned Discharge Destination: Home with Home Health    Time spent: 36 minutes  Author: Ezekiel Slocumb, DO 09/26/2022 2:05 PM  For on call review [email or web address].

## 2022-09-26 NOTE — Consult Note (Signed)
ANTICOAGULATION CONSULT NOTE  Pharmacy Consult for IV Heparin Indication: atrial fibrillation  Patient Measurements: Height: 5\' 8"  (172.7 cm) Weight: 109.5 kg (241 lb 4.8 oz) IBW/kg (Calculated) : 63.9 Heparin Dosing Weight: 85.8 kg  Labs: Recent Labs    09/24/22 0413 09/25/22 0502 09/25/22 0507 09/26/22 0522 09/26/22 1448  HGB 10.1*  --  10.2* 11.4*  --   HCT 30.0*  --  31.6* 35.1*  --   PLT 276  --  299 357  --   HEPARINUNFRC 0.37 0.34  --  0.25* 0.41  CREATININE 0.72  --  0.76 0.78  --     Estimated Creatinine Clearance: 86 mL/min (by C-G formula based on SCr of 0.78 mg/dL).  Medical History: Past Medical History:  Diagnosis Date   AF (atrial fibrillation) (HCC)    Anal fissure    Anxiety    Cardiac arrhythmia due to congenital heart disease    Chicken pox    Depression    GERD (gastroesophageal reflux disease)    Heart murmur     Medications:  Apixaban 5 mg BID (last dose 09/16/22 at 2251)  Assessment: 72 y/o F with medical history including Afib on apixaban who is admitted with acute pancreatitis. General surgery consulted. Pharmacy consulted to initiate and manage heparin infusion while apixaban is on hold pending surgery evaluation. Heparin was stopped early morning 1/21 @ 0300 for surgery but patient then became not amenable to procedure, pulled out her IV, and wanted to go home. She was reoriented and agreed to surgery, which was subsequently postponed. In order to allow time for new IV placement, per discussion with MD, we opted to give a one-time dose of enoxaparin 1 mg/kg to provide anticoagulation until IV access could be established again for heparin. IV access established again 1/21 @ ~1530.  1/20 1734 aPTT 79 before heparin started. No baseline heparin level ordered prior to giving dose of enoxaparin.  1/22 0816 aPTT 52 HL 0.71 1/22 1615 aPTT 58 1/22 2251 aPTT 51, subtherapeutic 1/23 0502 aPTT 80 therapeutic x 1, HL 0.62 1/23 1228 aPTT 77 HL  0.53 1/24 0428 HL 0.55 therapeutic x 3, aPTT 63 1/25 0642 HL 0.51, therapeutic x 4 1/26 0705 HL 0.35, therapeutic x 5 1/27 0413 HL 0.37, therapeutic x 6 1/28 0502 HL 0.34, therapeutic x 7 1/29 0522 HL 0.25, subtherapeutic 1/29 1448 HL 0.41, therapeutic x 1  Goal of Therapy:  Heparin level 0.3-0.7 units/ml Monitor platelets by anticoagulation protocol: Yes  Plan: --Continue heparin infusion at 1950 units/hr --Check confirmatory HL in 6 hrs --Daily CBC per protocol while on IV heparin --Follow-up transition back to home apixaban when appropriate  Pearla Dubonnet, PharmD Clinical Pharmacist 09/26/2022 4:46 PM

## 2022-09-26 NOTE — Consult Note (Signed)
[name] for IV Heparin Indication: atrial fibrillation  Patient Measurements: Height: 5\' 8"  (172.7 cm) Weight: 109.5 kg (241 lb 4.8 oz) IBW/kg (Calculated) : 63.9 Heparin Dosing Weight: 85.8 kg  Labs: Recent Labs    09/24/22 0413 09/25/22 0502 09/25/22 0507 09/26/22 0522  HGB 10.1*  --  10.2* 11.4*  HCT 30.0*  --  31.6* 35.1*  PLT 276  --  299 357  HEPARINUNFRC 0.37 0.34  --  0.25*  CREATININE 0.72  --  0.76 0.78    Estimated Creatinine Clearance: 86 mL/min (by C-G formula based on SCr of 0.78 mg/dL).  Medical History: Past Medical History:  Diagnosis Date   AF (atrial fibrillation) (HCC)    Anal fissure    Anxiety    Cardiac arrhythmia due to congenital heart disease    Chicken pox    Depression    GERD (gastroesophageal reflux disease)    Heart murmur     Medications:  Apixaban 5 mg BID (last dose 09/16/22 at 2251)  Assessment: 72 y/o F with medical history including Afib on apixaban who is admitted with acute pancreatitis. General surgery consulted. Pharmacy consulted to initiate and manage heparin infusion while apixaban is on hold pending surgery evaluation. Heparin was stopped early morning 1/21 @ 0300 for surgery but patient then became not amenable to procedure, pulled out her IV, and wanted to go home. She was reoriented and agreed to surgery, which was subsequently postponed. In order to allow time for new IV placement, per discussion with MD, we opted to give a one-time dose of enoxaparin 1 mg/kg to provide anticoagulation until IV access could be established again for heparin. IV access established again 1/21 @ ~1530.  1/20 1734 aPTT 79 before heparin started. No baseline heparin level ordered prior to giving dose of enoxaparin.  1/22 0816 aPTT 52 HL 0.71 1/22 1615 aPTT 58 1/22 2251 aPTT 51, subtherapeutic 1/23 0502 aPTT 80 therapeutic x 1, HL 0.62 1/23 1228 aPTT 77 HL 0.53 1/24 0428 HL 0.55 therapeutic x 3, aPTT  63 1/25 0642 HL 0.51, therapeutic x 4 1/26 0705 HL 0.35, therapeutic x 5 1/27 0413 HL 0.37, therapeutic x 6 1/28 0502 HL 0.34, therapeutic x 7 1/29 0522 HL 0.25, subtherapeutic  Goal of Therapy:  Heparin level 0.3-0.7 units/ml Monitor platelets by anticoagulation protocol: Yes  Plan: --Bolus 1300 units x 1 --Increase heparin infusion to 1950 units/hr --Re-check HL in 6 hr after rate change --Daily CBC per protocol while on IV heparin --Follow-up transition back to home apixaban when appropriate  Renda Rolls, PharmD, [facility] 09/26/2022 6:44 AM

## 2022-09-26 NOTE — Consult Note (Signed)
ANTICOAGULATION CONSULT NOTE  Pharmacy Consult for IV Heparin Indication: atrial fibrillation  Patient Measurements: Height: 5\' 8"  (172.7 cm) Weight: 109.5 kg (241 lb 4.8 oz) IBW/kg (Calculated) : 63.9 Heparin Dosing Weight: 85.8 kg  Labs: Recent Labs    09/24/22 0413 09/25/22 0502 09/25/22 0507 09/26/22 0522 09/26/22 1448 09/26/22 2152  HGB 10.1*  --  10.2* 11.4*  --   --   HCT 30.0*  --  31.6* 35.1*  --   --   PLT 276  --  299 357  --   --   HEPARINUNFRC 0.37   < >  --  0.25* 0.41 0.54  CREATININE 0.72  --  0.76 0.78  --   --    < > = values in this interval not displayed.    Estimated Creatinine Clearance: 86 mL/min (by C-G formula based on SCr of 0.78 mg/dL).  Medical History: Past Medical History:  Diagnosis Date   AF (atrial fibrillation) (HCC)    Anal fissure    Anxiety    Cardiac arrhythmia due to congenital heart disease    Chicken pox    Depression    GERD (gastroesophageal reflux disease)    Heart murmur     Medications:  Apixaban 5 mg BID (last dose 09/16/22 at 2251)  Assessment: 72 y/o F with medical history including Afib on apixaban who is admitted with acute pancreatitis. General surgery consulted. Pharmacy consulted to initiate and manage heparin infusion while apixaban is on hold pending surgery evaluation. Heparin was stopped early morning 1/21 @ 0300 for surgery but patient then became not amenable to procedure, pulled out her IV, and wanted to go home. She was reoriented and agreed to surgery, which was subsequently postponed. In order to allow time for new IV placement, per discussion with MD, we opted to give a one-time dose of enoxaparin 1 mg/kg to provide anticoagulation until IV access could be established again for heparin. IV access established again 1/21 @ ~1530.  1/20 1734 aPTT 79 before heparin started. No baseline heparin level ordered prior to giving dose of enoxaparin.  1/22 0816 aPTT 52 HL 0.71 1/22 1615 aPTT 58 1/22 2251 aPTT 51,  subtherapeutic 1/23 0502 aPTT 80 therapeutic x 1, HL 0.62 1/23 1228 aPTT 77 HL 0.53 1/24 0428 HL 0.55 therapeutic x 3, aPTT 63 1/25 0642 HL 0.51, therapeutic x 4 1/26 0705 HL 0.35, therapeutic x 5 1/27 0413 HL 0.37, therapeutic x 6 1/28 0502 HL 0.34, therapeutic x 7 1/29 0522 HL 0.25, subtherapeutic 1/29 1448 HL 0.41, therapeutic x 1 1/29 2152 HL 0.54, therapeutic x 2  Goal of Therapy:  Heparin level 0.3-0.7 units/ml Monitor platelets by anticoagulation protocol: Yes  Plan: --Continue heparin infusion at 1950 units/hr --Check confirmatory HL in 6 hrs --Daily CBC per protocol while on IV heparin --Follow-up transition back to home apixaban when appropriate  Darrick Penna, PharmD Clinical Pharmacist 09/26/2022 10:19 PM

## 2022-09-27 DIAGNOSIS — K859 Acute pancreatitis without necrosis or infection, unspecified: Secondary | ICD-10-CM | POA: Diagnosis not present

## 2022-09-27 DIAGNOSIS — K5720 Diverticulitis of large intestine with perforation and abscess without bleeding: Secondary | ICD-10-CM | POA: Diagnosis not present

## 2022-09-27 LAB — CBC
HCT: 34 % — ABNORMAL LOW (ref 36.0–46.0)
Hemoglobin: 10.9 g/dL — ABNORMAL LOW (ref 12.0–15.0)
MCH: 28.2 pg (ref 26.0–34.0)
MCHC: 32.1 g/dL (ref 30.0–36.0)
MCV: 88.1 fL (ref 80.0–100.0)
Platelets: 399 10*3/uL (ref 150–400)
RBC: 3.86 MIL/uL — ABNORMAL LOW (ref 3.87–5.11)
RDW: 15 % (ref 11.5–15.5)
WBC: 16.2 10*3/uL — ABNORMAL HIGH (ref 4.0–10.5)
nRBC: 0 % (ref 0.0–0.2)

## 2022-09-27 LAB — HEPARIN LEVEL (UNFRACTIONATED): Heparin Unfractionated: 0.35 IU/mL (ref 0.30–0.70)

## 2022-09-27 LAB — BASIC METABOLIC PANEL
Anion gap: 4 — ABNORMAL LOW (ref 5–15)
BUN: 17 mg/dL (ref 8–23)
CO2: 25 mmol/L (ref 22–32)
Calcium: 8.4 mg/dL — ABNORMAL LOW (ref 8.9–10.3)
Chloride: 101 mmol/L (ref 98–111)
Creatinine, Ser: 0.75 mg/dL (ref 0.44–1.00)
GFR, Estimated: >60 mL/min (ref >60)
Glucose, Bld: 188 mg/dL — ABNORMAL HIGH (ref 70–99)
Potassium: 4.5 mmol/L (ref 3.5–5.1)
Sodium: 130 mmol/L — ABNORMAL LOW (ref 135–145)

## 2022-09-27 LAB — GLUCOSE, CAPILLARY
Glucose-Capillary: 164 mg/dL — ABNORMAL HIGH (ref 70–99)
Glucose-Capillary: 169 mg/dL — ABNORMAL HIGH (ref 70–99)
Glucose-Capillary: 188 mg/dL — ABNORMAL HIGH (ref 70–99)
Glucose-Capillary: 190 mg/dL — ABNORMAL HIGH (ref 70–99)
Glucose-Capillary: 190 mg/dL — ABNORMAL HIGH (ref 70–99)

## 2022-09-27 MED ORDER — TRAVASOL 10 % IV SOLN
INTRAVENOUS | Status: AC
  Filled 2022-09-27: qty 1075.2

## 2022-09-27 NOTE — Progress Notes (Addendum)
Progress Note   Patient: Molly Brown IRS:[id number] DOB: [birth year] DOA: 09/15/2022     12 DOS: the patient was seen and examined on 09/27/2022   Brief hospital course: 72 y.o. 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 Patient presents for evaluation of sudden onset abdominal pain mostly in the epigastrium, periumbilical area.  She rates her pain a 10 x 10 in intensity at its worst.  Pain is nonradiating and is associated with nausea and multiple episodes of emesis.  She states that she has had issues with abdominal pain in the past usually self-limiting but this is the worst pain she has had which prompted her visit to the emergency room.  She is unable to tell me if her prior episodes of pain related to meals.  She has urinary frequency and has dyspnea with exertion which is chronic.  Her last bowel movement was 2 days prior to this admission.  According to the patient she usually has daily bowel movements She denies having any fever, no chills, no chest pain, no headache, no dizziness, no lightheadedness, no cough, no blurred vision no focal deficit. Abnormal labs include lactic acid of 2.0, lipase 539, troponin 24, white count 15.8 CT scan of abdomen and pelvis shows acute interstitial pancreatitis. No evidence of pancreatic necrosis, pancreatic ductal dilation or walled-off collections. Calcifications of the mitral annulus with left atrial dilation, consider further evaluation with cardiac echo if not previously performed. Mild pulmonary edema. Aortic Atherosclerosis. Twelve-lead EKG reviewed by me shows rapid A-fib Patient received 2 L IV fluid bolus, Cardizem 20 mg IV push x 1 and is currently on a Cardizem drip.  1/19.   Tapered off Cardizem drip and on oral metoprolol and oral Cardizem CD.  Started empiric antibiotics for pancreatitis since procalcitonin elevated and leukocytosis. 1/20.  Consulted general surgery with ?gallstone pancreatitis.  Surgery ordered for a repeat CAT scan.  Holding Eliquis and started heparin drip just in case surgery needed. 1/21.  Patient pulled out her IV and wanted to go home.  Patient was reoriented and then agreeable to stay and to surgery if needed.  Advanced diet to full liquids. 1/22.  Bilirubin rising, MRCP showed acute interstitial edematous pancreatitis with peripancreatic fluid collections with some signs of hemorrhagic or proteinaceous change.  Some peripancreatic necrosis seen.  Pancreatic ascites.  Rather striking tapered narrowing of the mid common bile duct with biliary enhancement.  Dilated main pancreatic duct.  Findings may reflect pancreatic divisum or dominant dorsal drainage via minor papilla. 1/23.  Dobbhoff tube had to be advanced twice.  Last x-ray showing in the duodenal bulb.  Okay to start tube feedings.  1/24: further K replacement.  Mental status worse again today.  Tube feeds running and appears to be tolerating okay.  Stopped antibiotics to monitor. 1/25: pt sleeping a lot. Fever 100.4 F last night, recurrent leukocytosis also.  Resumed Zosyn.  Replaced phos.  Sodium declining slowly. Cardiology gave IV Lasix. 1/26: repeated CT, has more pancreatic fluid collections but no necrosis. New finding of acute diverticulitis with contained perforation. Surgery re-consulted.  Starting TPN and continuing antibiotics 1/27 >> 1/30:  On TPN, IV antibiotics.  Repeat CT slightly larger ?early abscess / fluid collection. Remains NPO.  Assessment and Plan: * Severe acute pancreatitis Acute severe  pancreatitis with increased fluid collections since prior CT but no evidence of necrosis on repeat CT (1/26).   On empiric Zosyn - continuing for diverticulitis. General surgery no  plan for cholecystectomy IgG4 pending.   The patient does not drink alcohol.  No new medications.  Triglycerides normal.  MRCP shows the possibility of pancreatic divisum which could increase risk of pancreatitis.   Lipase normalized pretty quickly. GI consulted, have signed off. Off  tube feeds today and on TPN due to acute diverticulitis.  Acute diverticulitis Repeated CT 1/26 to re-assess pancreatitis and patient now has sigmoid diverticulitis with contained perforation.  She has point tenderness of LLQ on exam, no peritoneal signs --General surgery following --Continue TPN --Continue Zosyn --NPO ex sips w meds or to wet mouth --Pain control, antiemetics PRN --Monitor abdominal exam closely --May require IR for abscess drainage but at this point, likely too small to target.  Surgery to discuss with IR.   Acute metabolic encephalopathy Improved after removing IV pain medications. 1/24 recurrent. Possibly due to needing more pain med yesterday --check ammonia level and VBG   Ammonia mildly elevated at 43. Given lactulose x 1 day  Continue lactulose if needed. Bowel regimen.  Atrial fibrillation with RVR (HCC) Chronic in nature.  Continue metoprolol and Cardizem CD.  Currently on heparin drip.  Situational anxiety Pt reports significant anxiety and family note she has physical shaking at times when anxious.  She reports some baseline anxiety, but severely exacerbated by  her current situation. --Low dose IV Valium PRN for anxiety.  Hypophosphatemia Last Phos 3.7 Monitor and further replace PRN.  Hyponatremia Improved with diuresis.  Intermittent IV Lasix PRN, assess daily Monitor BMP and volume status.  1/30: Na 130.  No documented weights and poor in/out charting. Difficult to assess fluid balance, but on exam she appears more dry today.   --Consider IV fluids  --Follow up weight  Hypokalemia K 4.5 today Monitor BMP  Acute pancreatitis See severe acute  pancreatitis  Obesity (BMI 30-39.9) Body mass index is 33.45 kg/m. Complicates overall care and prognosis.  Recommend lifestyle modifications including physical activity and diet for weight loss and overall long-term health.   HOCM (hypertrophic obstructive cardiomyopathy) ([place]) Patient with a history of HOCM Continue metoprolol.   Hypothyroidism Continue Synthroid  GERD (gastroesophageal reflux disease) Continue PPI        Subjective: Patient up in recliner, husband had stepped out when seen this AM on rounds.  Pt a little bit groggy, recently given pain medication.  She notes overall some improvement in abdominal pain.  No other acute complaints.   Physical Exam: Vitals:   09/27/22 0512 09/27/22 0817 09/27/22 1033 09/27/22 1235  BP: 115/78 119/61  128/79  Pulse: 84 83  73  Resp: 17 17  19   Temp: 98.3 F (36.8 C) 97.6 F (36.4 C)  (!) 97.3 F (36.3 C)  TempSrc: Oral Axillary  Oral  SpO2: 98% 98%  100%  Weight:   102.9 kg   Height:       General exam: awake, alert, no acute distress, obese HEENT: Dobhoff NG tube in place with tube feeds off, moist mucus membranes, hearing grossly normal  Respiratory system: on room air, normal respiratory effort. Lungs clear Cardiovascular system: RRR, improved nearly resolved lower extremity and pedal edema Gastrointestinal system: abdominal distention seems improved, less tender LLQ, no rebound tenderness or peritoneal signs, hypoactive bowel sounds Central nervous system: A&Ox4, no gross focal neurologic deficits, normal speech Extremities: moves all,  no edema, normal tone Skin: dry, intact, normal temperature Psychiatry: normal mood, congruent affect, judgement and insight appear normal     Data Reviewed: Notable labs --  Na 130, glucose 188, Ca 8.4, gap 4.  WBC 16.2 stable.  Hbg 10.9 stable.    Repeat CT abdomen/pelvis with IV contrast 1/29 --- persistent diverticulitis, slight enlargement of sigmoid fluid collection,  suspected to be developing abscess, no extraluminal air.  Persistent pancreatitis changes with more confluent fluid collection anteriorly and inferiorly.  Repeat CT abdomen/pelvis with IV contrast 1/26  AM --- new sigmoid diverticulitis and more pancreatic fluid collections, but none are rim enhancing, no evidence of necrosis.    Family Communication: Updated husband at the bedside on rounds 1/29.  None present this AM, will call or return to room later.  Disposition: Status is: Inpatient Remains inpatient appropriate because: On TPN, IV antibiotics.  Pt remains severely ill at this time as outlined above.   Planned Discharge Destination: Home with Home Health    Time spent: 36 minutes  Author: Ezekiel Slocumb, DO 09/27/2022 2:30 PM  For on call review [email or web address].

## 2022-09-27 NOTE — Progress Notes (Signed)
PT Cancellation Note  Patient Details Name: Molly Brown MRN: [id number] DOB: 1951/02/28   Cancelled Treatment:    Reason Eval/Treat Not Completed: Fatigue/lethargy limiting ability to participate. Pt's chart reviewed prior to entry. Pt received sleeping in bed, lethargic overall but awakens to voice. Difficulty keeping eyes open talking to author. Pt politely declining PT at this time. Agreeable to participate tomorrow. PT to re-attempt as able.    Salem Caster. Fairly IV, PT, DPT Physical Therapist- [facility]  09/27/2022, 3:25 PM

## 2022-09-27 NOTE — Progress Notes (Signed)
[facility] SURGICAL PROGRESS NOTE (cpt [phone])  Hospital Day(s): 12.  Interval History:  Patient seen and examined No issues overnight Patient unchanged abdominal pain, difficult to distinguish between pancreatitis and diverticulitis No fever, chills, nausea, emesis  Stable appearing leukocytosis; 16.2K (x48 hours) Hgb stable to 10.9 Renal function normal; sCr - 0.75; UO - 1250 ccs + unmeasured No significant electrolyte derangements Continues on Zosyn NPO w/ TPN CT Abdomen/Pelvis (01/29) with stable appearance of diverticular process, small air/fluid collection appears stable, no massive pneumoperitoneum   Review of Systems:  Constitutional: denies fever, chills  HEENT: denies cough or congestion  Respiratory: denies any shortness of breath  Cardiovascular: denies chest pain or palpitations  Gastrointestinal: + abdominal pain (LLQ; seems to be improving), denied N/V Genitourinary: denies burning with urination or urinary frequency Musculoskeletal: denies pain, decreased motor or sensation  Vital signs in last 24 hours: [min-max] current  Temp:  [97.5 F (36.4 C)-98.7 F (37.1 C)] 98.3 F (36.8 C) (01/30 0512) Pulse Rate:  [73-89] 84 (01/30 0512) Resp:  [15-20] 17 (01/30 0512) BP: (111-144)/(63-86) 115/78 (01/30 0512) SpO2:  [98 %-100 %] 98 % (01/30 0512)     Height: 5\' 8"  (172.7 cm) Weight: 109.5 kg BMI (Calculated): 36.7   Intake/Output last 2 shifts:  01/29 0701 - 01/30 0700 In: 240 [P.O.:240] Out: 1250 [Urine:1250]   Physical Exam:  Constitutional: alert, cooperative and no distress  HENT: normocephalic without obvious abnormality, Dobbhoff in place Eyes: PERRL, EOM's grossly intact and symmetric  Respiratory: breathing non-labored at rest  Cardiovascular: regular rate and sinus rhythm  Gastrointestinal: soft, she is point tender in LLQ but seems improved some, non-distended, no rebound/guarding. She is not overtly peritonitic nor toxic   Musculoskeletal: no edema or wounds, motor and sensation grossly intact, NT    Labs:     Latest Ref Rng & Units 09/27/2022    6:48 AM 09/26/2022    5:22 AM 09/25/2022    5:07 AM  CBC  WBC 4.0 - 10.5 K/uL 16.2  16.0  16.2   Hemoglobin 12.0 - 15.0 g/dL 10.9  11.4  10.2   Hematocrit 36.0 - 46.0 % 34.0  35.1  31.6   Platelets 150 - 400 K/uL 399  357  299       Latest Ref Rng & Units 09/26/2022    5:22 AM 09/25/2022    5:07 AM 09/24/2022    4:13 AM  CMP  Glucose 70 - 99 mg/dL 156  157  135   BUN 8 - 23 mg/dL 17  12  11    Creatinine 0.44 - 1.00 mg/dL 0.78  0.76  0.72   Sodium 135 - 145 mmol/L 134  132  133   Potassium 3.5 - 5.1 mmol/L 4.3  4.3  3.9   Chloride 98 - 111 mmol/L 100  104  98   CO2 22 - 32 mmol/L 23  25  27    Calcium 8.9 - 10.3 mg/dL 8.7  8.3  8.4   Total Protein 6.5 - 8.1 g/dL 6.7   5.9   Total Bilirubin 0.3 - 1.2 mg/dL 0.9   1.0   Alkaline Phos 38 - 126 U/L 81   76   AST 15 - 41 U/L 24   35   ALT 0 - 44 U/L 28   36      Imaging studies:   CT Abdomen/Pelvis (09/26/2022) personal;ly reviewed which shows stable appearance of diverticular process, small air/fluid collection appears stable, no massive pneumoperitoneum,  pancreatitis again noted, Dobbhoff in position, and radiologist report reviewed below:  IMPRESSION: Persistent changes of diverticulitis at the junction of the descending and sigmoid colons. Slight enlargement of the previously described air-fluid collection is noted consistent with developing abscess. No extraluminal air is seen.   Persistent changes of pancreatitis with some more confluent fluid collections both anteriorly and inferiorly to the pancreas. No true pseudocyst is noted at this time.   No new focal abnormality is noted.   Assessment/Plan: (ICD-10's: K56.32) 72 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   - Reviewed case and imaging with interventional  radiologist this morning. Agreed that diverticular process appeared stable and collection seemed to be responding to Abx therapy. Given that there was more air than fluid, felt there was high risk for a persistent fistula with drain placement. She remains stable, non-toxic, without peritonitis. For now, we will hold on drain placement. Pending clinical progression, may need repeat imaging in 72 hours or sooner if any signs of clinical deterioration.    - Continue NPO for now; Okay for sips with meds; water/ice for comfort  - Continue TPN for now; at goal   - Hold enteric feedings; likely restart trickle feeding tomorrow (01/31) pending condition - Continue IV Abx (Zosyn) - No need for emergent surgical intervention currently. She understands that should she fail to improve or clinically deteriorate, we may need to consider intervention which would likely result in temporizing colostomy.   - Monitor abdominal examination; on-going bowel function - Monitor leukocytosis; stable - Pain control prn; antiemetics prn - Okay to mobilize as tolerated/feasible  - Further management per primary service; we will follow    All of the above findings and recommendations were discussed with the patient, patient's family, and the medical team, and all of patient's and family's questions were answered to their expressed satisfaction.  -- Edison Simon, PA-C [facility] 09/27/2022, 7:12 AM M-F: 7am - 4pm

## 2022-09-27 NOTE — Consult Note (Signed)
PHARMACY - TOTAL PARENTERAL NUTRITION CONSULT NOTE   Indication: Prolonged ileus  Patient Measurements: Height: 5\' 8"  (172.7 cm) Weight: 109.5 kg (241 lb 4.8 oz) IBW/kg (Calculated) : 63.9 TPN AdjBW (KG): 72.9 Body mass index is 36.69 kg/m.  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 Patient subsequently admitted for acute pancreatitis. Hospital course complicated by development of diverticulitis with contained perforation. Enteric feedings placed on hold. Pharmacy consulted to initiate and manage TPN for prolonged ileus.   Glucose / Insulin:  --Hgb A1c on 05/31/22 was 6% (pre-diabetes) --Patient has been mildly hyperglycemic on tube feeds but within goal range -BG  169-188 SSI per 24 hrs: 11 units  1/27: TG=162 > 118 Electrolytes:  -- Na 132 > 134 > 130 Renal:  --Scr < 1 Hepatic:  --Overall within normal limits, hyperbilirubinemia resolved Intake / Output; MIVF:  --I&Os have not been charted strictly --No MIVF GI Imaging: 1/26 CT abdomen / pelvis: Interval changes of acute diverticulitis involving the proximal sigmoid colon with a contained perforation. Increased peripancreatic edema and fluid including multiple developing fluid collections without surrounding rim enhancement at this time and no evidence of pancreatic necrosis. GI Surgeries / Procedures: N/A  Central access: PICC placed 1/26 TPN start date: 1/26  Nutritional Goals: Goal TPN rate is 80 mL/hr (provides 107 g of protein and 2050 kcals per day)  RD Assessment: Estimated Needs Total Energy Estimated Needs: 2050-2250 Total Protein Estimated Needs: 105-120 grams Total Fluid Estimated Needs: > 2 L  Current Nutrition:  TPN Tube feed stopped 1/26 -strict NPO/bowel rest, PO meds being given  Plan:  --Will continue goal TPN rate of 80 mL/hr at 1800 --Electrolytes in TPN: Na 36mEq/L, K 98mEq/L, Ca  60mEq/L, Mg 3mEq/L, and Phos 52mmol/L. Cl:Ac 1:1 --Add standard MVI and trace elements to TPN, s/p thiamine 100 mg x 3 days  -discontinue po MVI for now -1/27: TG 162 --Initiate Moderate q6h SSI and adjust as needed  --Monitor TPN labs 1st 3 days then on Mon/Thurs, daily until stable  Oswald Hillock, PharmD, BCPS 09/27/2022,7:34 AM

## 2022-09-27 NOTE — Consult Note (Signed)
[facility] for IV Heparin Indication: atrial fibrillation  Patient Measurements: Height: 5\' 8"  (172.7 cm) Weight: 109.5 kg (241 lb 4.8 oz) IBW/kg (Calculated) : 63.9 Heparin Dosing Weight: 85.8 kg  Labs: Recent Labs    09/25/22 0507 09/26/22 0522 09/26/22 1448 09/26/22 2152 09/27/22 0648  HGB 10.2* 11.4*  --   --  10.9*  HCT 31.6* 35.1*  --   --  34.0*  PLT 299 357  --   --  399  HEPARINUNFRC  --  0.25* 0.41 0.54 0.35  CREATININE 0.76 0.78  --   --  0.75    Estimated Creatinine Clearance: 86 mL/min (by C-G formula based on SCr of 0.75 mg/dL).  Medical History: Past Medical History:  Diagnosis Date   AF (atrial fibrillation) (HCC)    Anal fissure    Anxiety    Cardiac arrhythmia due to congenital heart disease    Chicken pox    Depression    GERD (gastroesophageal reflux disease)    Heart murmur     Medications:  Apixaban 5 mg BID (last dose 09/16/22 at 2251)  Assessment: 72 y/o F with medical history including Afib on apixaban who is admitted with acute pancreatitis. General surgery consulted. Pharmacy consulted to initiate and manage heparin infusion while apixaban is on hold pending surgery evaluation. Currently no need for surgical intervention.   1/20 1734 aPTT 79 before heparin started. No baseline heparin level ordered prior to giving dose of enoxaparin.  1/22 0816 aPTT 52 HL 0.71 1/22 1615 aPTT 58 1/22 2251 aPTT 51, subtherapeutic 1/23 0502 aPTT 80 therapeutic x 1, HL 0.62 1/23 1228 aPTT 77 HL 0.53 1/24 0428 HL 0.55 therapeutic x 3, aPTT 63 1/25 0642 HL 0.51, therapeutic x 4 1/26 0705 HL 0.35, therapeutic x 5 1/27 0413 HL 0.37, therapeutic x 6 1/28 0502 HL 0.34, therapeutic x 7 1/29 0522 HL 0.25, subtherapeutic 1/29 1448 HL 0.41, therapeutic x 1 1/29 2152 HL 0.54, therapeutic x 2 1/30 0648 HL 0.35, therapeutic.   Goal of Therapy:  Heparin level 0.3-0.7 units/ml Monitor platelets by anticoagulation protocol:  Yes  Plan: Heparin level is therapeutic. Will continue heparin infusion at 1950 unit/hr. Recheck heparin level and CBC with AM labs.   Oswald Hillock, PharmD Clinical Pharmacist 09/27/2022 7:26 AM

## 2022-09-27 NOTE — Plan of Care (Signed)
Patient remained stable overnight and vital signs stable. Remains on TPN, Heparin, and intermittent abx. DHT in placed marked at 55cm in the L nare is clamped. Pain managed per MAR. Patient required coverage insulin overnight.   Problem: Education: Goal: Knowledge of General Education information will improve Description: Including pain rating scale, medication(s)/side effects and non-pharmacologic comfort measures Outcome: Progressing   Problem: Health Behavior/Discharge Planning: Goal: Ability to manage health-related needs will improve Outcome: Progressing   Problem: Clinical Measurements: Goal: Ability to maintain clinical measurements within normal limits will improve Outcome: Progressing Goal: Will remain free from infection Outcome: Progressing Goal: Diagnostic test results will improve Outcome: Progressing Goal: Respiratory complications will improve Outcome: Progressing Goal: Cardiovascular complication will be avoided Outcome: Progressing   Problem: Activity: Goal: Risk for activity intolerance will decrease Outcome: Progressing   Problem: Nutrition: Goal: Adequate nutrition will be maintained Outcome: Progressing   Problem: Coping: Goal: Level of anxiety will decrease Outcome: Progressing   Problem: Elimination: Goal: Will not experience complications related to bowel motility Outcome: Progressing Goal: Will not experience complications related to urinary retention Outcome: Progressing   Problem: Pain Managment: Goal: General experience of comfort will improve Outcome: Progressing   Problem: Safety: Goal: Ability to remain free from injury will improve Outcome: Progressing   Problem: Skin Integrity: Goal: Risk for impaired skin integrity will decrease Outcome: Progressing

## 2022-09-28 DIAGNOSIS — K5720 Diverticulitis of large intestine with perforation and abscess without bleeding: Secondary | ICD-10-CM | POA: Diagnosis not present

## 2022-09-28 DIAGNOSIS — K859 Acute pancreatitis without necrosis or infection, unspecified: Secondary | ICD-10-CM | POA: Diagnosis not present

## 2022-09-28 LAB — HEPARIN LEVEL (UNFRACTIONATED): Heparin Unfractionated: 0.56 IU/mL (ref 0.30–0.70)

## 2022-09-28 LAB — CBC
HCT: 34 % — ABNORMAL LOW (ref 36.0–46.0)
Hemoglobin: 11 g/dL — ABNORMAL LOW (ref 12.0–15.0)
MCH: 28.4 pg (ref 26.0–34.0)
MCHC: 32.4 g/dL (ref 30.0–36.0)
MCV: 87.6 fL (ref 80.0–100.0)
Platelets: 414 10*3/uL — ABNORMAL HIGH (ref 150–400)
RBC: 3.88 MIL/uL (ref 3.87–5.11)
RDW: 14.9 % (ref 11.5–15.5)
WBC: 14.1 10*3/uL — ABNORMAL HIGH (ref 4.0–10.5)
nRBC: 0 % (ref 0.0–0.2)

## 2022-09-28 LAB — BASIC METABOLIC PANEL
Anion gap: 3 — ABNORMAL LOW (ref 5–15)
BUN: 18 mg/dL (ref 8–23)
CO2: 28 mmol/L (ref 22–32)
Calcium: 8.9 mg/dL (ref 8.9–10.3)
Chloride: 100 mmol/L (ref 98–111)
Creatinine, Ser: 0.76 mg/dL (ref 0.44–1.00)
GFR, Estimated: >60 mL/min (ref >60)
Glucose, Bld: 182 mg/dL — ABNORMAL HIGH (ref 70–99)
Potassium: 4.5 mmol/L (ref 3.5–5.1)
Sodium: 131 mmol/L — ABNORMAL LOW (ref 135–145)

## 2022-09-28 LAB — GLUCOSE, CAPILLARY
Glucose-Capillary: 166 mg/dL — ABNORMAL HIGH (ref 70–99)
Glucose-Capillary: 202 mg/dL — ABNORMAL HIGH (ref 70–99)
Glucose-Capillary: 204 mg/dL — ABNORMAL HIGH (ref 70–99)
Glucose-Capillary: 212 mg/dL — ABNORMAL HIGH (ref 70–99)
Glucose-Capillary: 213 mg/dL — ABNORMAL HIGH (ref 70–99)
Glucose-Capillary: 217 mg/dL — ABNORMAL HIGH (ref 70–99)

## 2022-09-28 MED ORDER — VITAL 1.5 CAL PO LIQD
1000.0000 mL | ORAL | Status: DC
  Administered 2022-09-28: 1000 mL

## 2022-09-28 MED ORDER — FREE WATER
30.0000 mL | Status: DC
  Administered 2022-09-28 – 2022-10-05 (×43): 30 mL

## 2022-09-28 MED ORDER — TRAVASOL 10 % IV SOLN
INTRAVENOUS | Status: AC
  Filled 2022-09-28: qty 1075.2

## 2022-09-28 NOTE — Progress Notes (Addendum)
[facility] SURGICAL PROGRESS NOTE (cpt [phone])  Hospital Day(s): 13.  Interval History:  Patient seen and examined No issues overnight Patient she is feeling much better this morning; had a good night sleep Did have a slight "twinge" of LLQ No fever, chills, nausea, emesis  Leukocytosis improving now; 14.1K Hgb stable to 11.0 Continues on Zosyn NPO w/ TPN  Review of Systems:  Constitutional: denies fever, chills  HEENT: denies cough or congestion  Respiratory: denies any shortness of breath  Cardiovascular: denies chest pain or palpitations  Gastrointestinal: + abdominal pain (LLQ; seems to be improving), denied N/V Genitourinary: denies burning with urination or urinary frequency Musculoskeletal: denies pain, decreased motor or sensation  Vital signs in last 24 hours: [min-max] current  Temp:  [97.3 F (36.3 C)-98.7 F (37.1 C)] 97.8 F (36.6 C) (01/31 0717) Pulse Rate:  [73-89] 88 (01/31 0717) Resp:  [16-21] 21 (01/31 0717) BP: (110-128)/(61-80) 110/66 (01/31 0717) SpO2:  [98 %-100 %] 100 % (01/31 0717) Weight:  [102.9 kg] 102.9 kg (01/30 1033)     Height: 5\' 8"  (172.7 cm) Weight: 102.9 kg BMI (Calculated): 34.49   Intake/Output last 2 shifts:  01/30 0701 - 01/31 0700 In: 684.4 [P.O.:120; I.V.:514.4; IV Piggyback:50] Out: 2750 [Urine:2750]   Physical Exam:  Constitutional: alert, cooperative and no distress  HENT: normocephalic without obvious abnormality, Dobbhoff in place Eyes: PERRL, EOM's grossly intact and symmetric  Respiratory: breathing non-labored at rest  Cardiovascular: regular rate and sinus rhythm  Gastrointestinal: soft, much improved LLQ pain, non-distended, no rebound/guarding. She is not overtly peritonitic nor toxic  Musculoskeletal: no edema or wounds, motor and sensation grossly intact, NT    Labs:     Latest Ref Rng & Units 09/28/2022    1:34 AM 09/27/2022    6:48 AM 09/26/2022    5:22 AM  CBC  WBC 4.0 - 10.5 K/uL 14.1   16.2  16.0   Hemoglobin 12.0 - 15.0 g/dL 11.0  10.9  11.4   Hematocrit 36.0 - 46.0 % 34.0  34.0  35.1   Platelets 150 - 400 K/uL 414  399  357       Latest Ref Rng & Units 09/27/2022    6:48 AM 09/26/2022    5:22 AM 09/25/2022    5:07 AM  CMP  Glucose 70 - 99 mg/dL 188  156  157   BUN 8 - 23 mg/dL 17  17  12    Creatinine 0.44 - 1.00 mg/dL 0.75  0.78  0.76   Sodium 135 - 145 mmol/L 130  134  132   Potassium 3.5 - 5.1 mmol/L 4.5  4.3  4.3   Chloride 98 - 111 mmol/L 101  100  104   CO2 22 - 32 mmol/L 25  23  25    Calcium 8.9 - 10.3 mg/dL 8.4  8.7  8.3   Total Protein 6.5 - 8.1 g/dL  6.7    Total Bilirubin 0.3 - 1.2 mg/dL  0.9    Alkaline Phos 38 - 126 U/L  81    AST 15 - 41 U/L  24    ALT 0 - 44 U/L  28      Imaging studies:  No new pertinent imaging studies    Assessment/Plan: (ICD-10's: K17.32) 72 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   - Okay to restart trickle feeding via Dobbhoff today and monitor tolerance; Okay for sips with meds; water/ice for comfort  -  Potentially do CLD tomorrow   - Continue TPN for now; at goal  - Continue IV Abx (Zosyn) - No need for emergent surgical intervention currently. She understands that should she fail to improve or clinically deteriorate, we may need to consider intervention which would likely result in temporizing colostomy.   - Pending clinical condition, may need repeat CT Abdomen/Pelvis Friday (02/02).   - Monitor abdominal examination; on-going bowel function - Monitor leukocytosis; improved - Pain control prn; antiemetics prn - Okay to mobilize as tolerated/feasible  - Further management per primary service; we will follow    All of the above findings and recommendations were discussed with the patient, patient's family, and the medical team, and all of patient's and family's questions were answered to their expressed satisfaction.  -- Edison Simon,  PA-C [facility] 09/28/2022, 7:41 AM M-F: 7am - 4pm

## 2022-09-28 NOTE — Progress Notes (Signed)
Physical Therapy Treatment Patient Details Name: Molly Brown MRN: [id number] DOB: 07/08/1951 Today's Date: 09/28/2022   History of Present Illness Pt is a 72 y.o. 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  MD assessment includes: Acute metabolic encephalopathy, hypokalemia, and hypertrophic obstructive cardiomyopathy.    PT Comments    Pt received upright in bed agreeable to bed level exercise. Reports just getting comfortable in the bed and having BM thus deferring OOB mobility this date. Per pt and family she has been very active transferring frequently in her room. Pt with good understanding of therex and encouraged for OOB mobility for future sessions to maintain/improve functional strength. Pt understanding. Pt with all needs in reach and will continue PT POC as able.    Recommendations for follow up therapy are one component of a multi-disciplinary discharge planning process, led by the attending physician.  Recommendations may be updated based on patient status, additional functional criteria and insurance authorization.  Follow Up Recommendations  Home health PT     Assistance Recommended at Discharge Frequent or constant Supervision/Assistance  Patient can return home with the following A little help with walking and/or transfers;A little help with bathing/dressing/bathroom;Assistance with cooking/housework;Assist for transportation;Help with stairs or ramp for entrance   Equipment Recommendations  None recommended by PT    Recommendations for Other Services       Precautions / Restrictions Precautions Precautions: Fall Restrictions Weight Bearing Restrictions: No     Mobility  Bed Mobility                    Transfers                         Ambulation/Gait                   Stairs             Wheelchair Mobility    Modified Rankin (Stroke Patients Only)       Balance                                            Cognition Arousal/Alertness: Awake/alert Behavior During Therapy: WFL for tasks assessed/performed Overall Cognitive Status: Within Functional Limits for tasks assessed                                 General Comments: Pleasant and very conversational        Exercises General Exercises - Lower Extremity Ankle Circles/Pumps: AROM, Strengthening, Both, 15 reps, Supine Short Arc Quad: AROM, Strengthening, Both, 15 reps, Supine Heel Slides: AROM, Strengthening, Supine, Both, 15 reps Hip ABduction/ADduction: AROM, Supine, Strengthening, Both, 10 reps Straight Leg Raises: AROM, Strengthening, Right, 10 reps, Left, Supine Other Exercises Other Exercises: Benefits of OOB mobility to return to PLOF.    General Comments        Pertinent Vitals/Pain Pain Assessment Pain Assessment: No/denies pain    Home Living                          Prior Function  PT Goals (current goals can now be found in the care plan section) Acute Rehab PT Goals Patient Stated Goal: To get stronger PT Goal Formulation: With patient Time For Goal Achievement: 10/04/22 Potential to Achieve Goals: Good Progress towards PT goals: PT to reassess next treatment    Frequency    Min 2X/week      PT Plan Current plan remains appropriate    Co-evaluation              AM-PAC PT "6 Clicks" Mobility   Outcome Measure  Help needed turning from your back to your side while in a flat bed without using bedrails?: A Little Help needed moving from lying on your back to sitting on the side of a flat bed without using bedrails?: A Little Help needed moving to and from a bed to a chair (including a wheelchair)?: A Little Help needed standing up from a  chair using your arms (e.g., wheelchair or bedside chair)?: A Little Help needed to walk in hospital room?: A Little Help needed climbing 3-5 steps with a railing? : A Lot 6 Click Score: 17    End of Session Equipment Utilized During Treatment: Gait belt Activity Tolerance: Patient tolerated treatment well Patient left: in bed;with call bell/phone within reach;with bed alarm set;with family/visitor present;with nursing/sitter in room Nurse Communication: Mobility status PT Visit Diagnosis: Muscle weakness (generalized) (M62.81);Difficulty in walking, not elsewhere classified (R26.2);Pain     Time: 9179-1505 PT Time Calculation (min) (ACUTE ONLY): 13 min  Charges:  $Therapeutic Exercise: 8-22 mins                    Salem Caster. Fairly IV, PT, DPT Physical Therapist- [facility]  09/28/2022, 3:21 PM

## 2022-09-28 NOTE — Consult Note (Signed)
ANTICOAGULATION CONSULT NOTE  Pharmacy Consult for IV Heparin Indication: atrial fibrillation  Patient Measurements: Height: 5\' 8"  (172.7 cm) Weight: 102.9 kg (226 lb 12.8 oz) IBW/kg (Calculated) : 63.9 Heparin Dosing Weight: 85.8 kg  Labs: Recent Labs    09/25/22 0507 09/26/22 0522 09/26/22 1448 09/26/22 2152 09/27/22 0648 09/28/22 0134  HGB 10.2* 11.4*  --   --  10.9* 11.0*  HCT 31.6* 35.1*  --   --  34.0* 34.0*  PLT 299 357  --   --  399 414*  HEPARINUNFRC  --  0.25*   < > 0.54 0.35 0.56  CREATININE 0.76 0.78  --   --  0.75  --    < > = values in this interval not displayed.    Estimated Creatinine Clearance: 83.3 mL/min (by C-G formula based on SCr of 0.75 mg/dL).  Medical History: Past Medical History:  Diagnosis Date   AF (atrial fibrillation) (HCC)    Anal fissure    Anxiety    Cardiac arrhythmia due to congenital heart disease    Chicken pox    Depression    GERD (gastroesophageal reflux disease)    Heart murmur     Medications:  Apixaban 5 mg BID (last dose 09/16/22 at 2251)  Assessment: 72 y/o F with medical history including Afib on apixaban who is admitted with acute pancreatitis. General surgery consulted. Pharmacy consulted to initiate and manage heparin infusion while apixaban is on hold pending surgery evaluation. Currently no need for surgical intervention.   1/20 1734 aPTT 79 before heparin started. No baseline heparin level ordered prior to giving dose of enoxaparin.  1/22 0816 aPTT 52 HL 0.71 1/22 1615 aPTT 58 1/22 2251 aPTT 51, subtherapeutic 1/23 0502 aPTT 80 therapeutic x 1, HL 0.62 1/23 1228 aPTT 77 HL 0.53 1/24 0428 HL 0.55 therapeutic x 3, aPTT 63 1/25 0642 HL 0.51, therapeutic x 4 1/26 0705 HL 0.35, therapeutic x 5 1/27 0413 HL 0.37, therapeutic x 6 1/28 0502 HL 0.34, therapeutic x 7 1/29 0522 HL 0.25, subtherapeutic 1/29 1448 HL 0.41, therapeutic x 1 1/29 2152 HL 0.54, therapeutic x 2 1/30 0648 HL 0.35, therapeutic.  1/31 0134  HL 0.56, therapeutic  Goal of Therapy:  Heparin level 0.3-0.7 units/ml Monitor platelets by anticoagulation protocol: Yes  Plan: Heparin level remains therapeutic. Will continue heparin infusion at 1950 unit/hr. Recheck heparin level and CBC with AM labs.   Dorothe Pea, PharmD, BCPS Clinical Pharmacist   09/28/2022 3:00 AM

## 2022-09-28 NOTE — Consult Note (Addendum)
PHARMACY - TOTAL PARENTERAL NUTRITION CONSULT NOTE   Indication: Prolonged ileus  Patient Measurements: Height: 5\' 8"  (172.7 cm) Weight: 102.9 kg (226 lb 12.8 oz) IBW/kg (Calculated) : 63.9 TPN AdjBW (KG): 72.9 Body mass index is 34.48 kg/m.  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 Patient subsequently admitted for acute pancreatitis. Hospital course complicated by development of diverticulitis with contained perforation. Enteric feedings placed on hold. Pharmacy consulted to initiate and manage TPN for prolonged ileus.   Glucose / Insulin:  --Hgb A1c on 05/31/22 was 6% (pre-diabetes) --Patient has been mildly hyperglycemic on tube feeds but within goal range -BG  166 SSI per 24 hrs: 14 units  1/27: TG=162 > 118 Electrolytes:  -- Na 132 > 134 > 130 Renal:  --Scr < 1 Hepatic:  --Overall within normal limits, hyperbilirubinemia resolved Intake / Output; MIVF:  --I&Os have not been charted strictly --No MIVF GI Imaging: 1/26 CT abdomen / pelvis: Interval changes of acute diverticulitis involving the proximal sigmoid colon with a contained perforation. Increased peripancreatic edema and fluid including multiple developing fluid collections without surrounding rim enhancement at this time and no evidence of pancreatic necrosis. GI Surgeries / Procedures: N/A  Central access: PICC placed 1/26 TPN start date: 1/26  Nutritional Goals: Goal TPN rate is 80 mL/hr (provides 107 g of protein and 2050 kcals per day)  RD Assessment: Estimated Needs Total Energy Estimated Needs: 2050-2250 Total Protein Estimated Needs: 105-120 grams Total Fluid Estimated Needs: > 2 L  Current Nutrition:  TPN Tube feed stopped 1/26 -strict NPO/bowel rest, PO meds being given  Plan:  --Will continue goal TPN rate of 80 mL/hr at 1800 --Electrolytes in TPN: Na 78mEq/L, K 80mEq/L, Ca  72mEq/L, Mg 23mEq/L, and Phos 67mmol/L. Cl:Ac 1:1 --planning to try trickle feeds 1/31.  --Add standard MVI and trace elements to TPN, s/p thiamine 100 mg x 3 days  -discontinue po MVI for now --Initiate Moderate q6h SSI and adjust as needed  --Monitor TPN labs 1st 3 days then on Mon/Thurs, daily until stable  Oswald Hillock, PharmD, BCPS 09/28/2022,8:32 AM

## 2022-09-28 NOTE — Progress Notes (Signed)
Nutrition Follow-up  DOCUMENTATION CODES:   Obesity unspecified  INTERVENTION:   -TPN management per pharmacy -Start trickle TF via post-pyloric NGT:  Vital 1.5 @ 20 ml/hr  Once medically appropriate, recommend advance 10 ml/hr every 12 hours to goal rate of 55 ml/hr  30 ml Prosource TF daily.     170 ml free water flush every 4 hours   Tube feeding regimen provides 2040 kcals, 109 grams of protein, and 1008 ml of H2O. Total free water: 2028 ml daily  NUTRITION DIAGNOSIS:   Increased nutrient needs related to acute illness (pancreatitis) as evidenced by estimated needs.  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  1/23- post pyloric feeding tube placed (tip past duodenal bulb per x-ray)    1/26- TF held secondary to findings of diverticulitis with perforation, TPN initiated  Reviewed I/O's: -2.1 L x 24 hours and +15.1 L since admission  UOP: 2.8 L x 24 hours  Per general surgery notes, plan to re-start trickle feedings today and monitor tolerance. Potential to advance to clear liquids tomorrow. No plan for emergent surgery at this time.   Pt remains on TPN for nutritional support. Receiving at goal rate of 80 ml/hr, which provides 2051 kcals and 108 grams protein, meeting 100% of estimated nutritional needs.   Wt has been stable since admission.   Medications reviewed and include vitamin B-12, cardizem, florastor, and senokot.   Labs reviewed: Na: 130, CBGS: 166-202 (inpatient orders for glycemic control are 0-15 units insulin  aspart every 6 hours).    Diet Order:   Diet Order             Diet NPO time specified Except for: Sips with Meds  Diet effective now                   EDUCATION NEEDS:   Education needs have been addressed  Skin:  Skin Assessment: Reviewed RN Assessment  Last BM:  09/21/22  Height:   Ht Readings from Last 1 Encounters:  09/15/22 5\' 8"  (1.727 m)    Weight:   Wt Readings from Last 1 Encounters:  09/28/22 101.9 kg    Ideal Body Weight:  63.6 kg  BMI:  Body mass index is 34.15 kg/m.  Estimated Nutritional Needs:   Kcal:  2050-2250  Protein:  105-120 grams  Fluid:  > 2 L    Loistine Chance, RD, LDN, [place] Registered Dietitian II Certified Diabetes Care and Education Specialist Please refer to [facility] for RD and/or RD on-call/weekend/after hours pager

## 2022-09-28 NOTE — Progress Notes (Signed)
Progress Note   Patient: Molly Brown CZY:[id number] DOB: Jul 16, 1951 DOA: 09/15/2022     13 DOS: the patient was seen and examined on 09/28/2022   Brief hospital course: 72 y.o. 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 Patient presents for evaluation of sudden onset abdominal pain mostly in the epigastrium, periumbilical area.  She rates her pain a 10 x 10 in intensity at its worst.  Pain is nonradiating and is associated with nausea and multiple episodes of emesis.  She states that she has had issues with abdominal pain in the past usually self-limiting but this is the worst pain she has had which prompted her visit to the emergency room.  She is unable to tell me if her prior episodes of pain related to meals.  She has urinary frequency and has dyspnea with exertion which is chronic.  Her last bowel movement was 2 days prior to this admission.  According to the patient she usually has daily bowel movements She denies having any fever, no chills, no chest pain, no headache, no dizziness, no lightheadedness, no cough, no blurred vision no focal deficit. Abnormal labs include lactic acid of 2.0, lipase 539, troponin 24, white count 15.8 CT scan of abdomen and pelvis shows acute interstitial pancreatitis. No evidence of pancreatic necrosis, pancreatic ductal dilation or walled-off collections. Calcifications of the mitral annulus with left atrial dilation, consider further evaluation with cardiac echo if not previously performed. Mild pulmonary edema. Aortic Atherosclerosis. Twelve-lead EKG reviewed by me shows rapid A-fib Patient received 2 L IV fluid bolus, Cardizem 20 mg IV push x 1 and is currently on a Cardizem drip.  1/19.   Tapered off Cardizem drip and on oral metoprolol and oral Cardizem CD.  Started empiric antibiotics for pancreatitis since procalcitonin elevated and leukocytosis. 1/20.  Consulted general surgery with ?gallstone pancreatitis.  Surgery ordered for a repeat CAT scan.  Holding Eliquis and started heparin drip just in case surgery needed. 1/21.  Patient pulled out her IV and wanted to go home.  Patient was reoriented and then agreeable to stay and to surgery if needed.  Advanced diet to full liquids. 1/22.  Bilirubin rising, MRCP showed acute interstitial edematous pancreatitis with peripancreatic fluid collections with some signs of hemorrhagic or proteinaceous change.  Some peripancreatic necrosis seen.  Pancreatic ascites.  Rather striking tapered narrowing of the mid common bile duct with biliary enhancement.  Dilated main pancreatic duct.  Findings may reflect pancreatic divisum or dominant dorsal drainage via minor papilla. 1/23.  Dobbhoff tube had to be advanced twice.  Last x-ray showing in the duodenal bulb.  Okay to start tube feedings.  1/24: further K replacement.  Mental status worse again today.  Tube feeds running and appears to be tolerating okay.  Stopped antibiotics to monitor. 1/25: pt sleeping a lot. Fever 100.4 F last night, recurrent leukocytosis also.  Resumed Zosyn.  Replaced phos.  Sodium declining slowly. Cardiology gave IV Lasix. 1/26: repeated CT, has more pancreatic fluid collections but no necrosis. New finding of acute diverticulitis with contained perforation. Surgery re-consulted.  Starting TPN and continuing antibiotics 1/27 >> 1/30:  On TPN, IV antibiotics.  Repeat CT slightly larger ?early abscess / fluid collection. Remains NPO. 1/31: surgery resuming Dobhoff trickle feeds. Leukocytosis and LLQ tenderness  improving.  Assessment and Plan: * Severe acute pancreatitis Acute severe pancreatitis with increased fluid collections since prior CT but no evidence of necrosis on  repeat CT (1/26).   On empiric Zosyn - continuing for diverticulitis. General surgery no plan for cholecystectomy IgG4 pending.   The patient does not drink alcohol.  No new medications.  Triglycerides normal.  MRCP shows the possibility of pancreatic divisum which could increase risk of pancreatitis.   Lipase normalized pretty quickly. GI consulted, have signed off. Tube feeds were held for acute diverticulitis with contained perf. Per surgery, resume trickle feeds today. Monitor abdominal exam. Will need close GI follow up for ongoing care of pancreatitis and repeat imaging.  Acute diverticulitis Repeated CT 1/26 to re-assess pancreatitis and patient now has sigmoid diverticulitis with contained perforation.  She has point tenderness of LLQ on exam, no peritoneal signs --General surgery following --Continue TPN --Trial resume Dobhoff tube feeds per surgery --Continue Zosyn --NPO ex sips w meds or to wet mouth --Pain control, antiemetics PRN --Monitor abdominal exam closely --May require IR for abscess drainage but at this point, likely too small to target.  Surgery to discuss with IR.   Acute metabolic encephalopathy Resolved.  Had some delirium when more ill and getting IV pain meds. --Delirium precautions --Minimize sedating meds  Atrial fibrillation with RVR (HCC) Chronic in nature.  Continue metoprolol and Cardizem CD.  Currently on heparin drip.  Situational anxiety Pt reports significant anxiety and family note she has physical shaking at times when anxious.  She reports some baseline anxiety, but severely exacerbated by  her current situation. --Low dose IV Valium PRN for anxiety.  Hypophosphatemia Last Phos 3.7 Monitor and further replace PRN.  Hyponatremia Improved with diuresis.  Intermittent IV Lasix PRN, assess daily Monitor BMP and volume status.  1/30: Na 130.  No documented weights and poor in/out charting. Difficult to assess fluid balance, but on exam  she appears more dry today.   1/31: Na 131, w no fluids or diuresis --Continue to monitor --If not improving, she may need gentle hydration. Seems clinically more dry with resolved edema she had earlier  Hypokalemia K 4.5 today Monitor BMP  Acute pancreatitis See severe acute pancreatitis  Obesity (BMI 30-39.9) Body mass index is 33.45 kg/m. Complicates overall care and prognosis.  Recommend lifestyle modifications including physical activity and diet for weight loss and overall long-term health.   HOCM (hypertrophic obstructive cardiomyopathy) ([place]) Patient with a history of HOCM Continue metoprolol.   Hypothyroidism Continue Synthroid  GERD (gastroesophageal reflux disease) Continue PPI        Subjective: Patient awake resting in bed, husband at bedside. Pt reports feeling a little better today. She is finally able to get better rest and more uninterrupted sleep.  She and husband reported pleased with staff's efforts to minimize interruptions and bundle patient care tasks as much as possible.  No N/V or other acute complaints.   Physical Exam: Vitals:   09/28/22 0531 09/28/22 0717 09/28/22 0842 09/28/22 1209  BP: 113/67 110/66  96/67  Pulse: 75 88  84  Resp: 18 (!) 21  20  Temp: 98.1 F (36.7 C) 97.8 F (36.6 C)  97.8 F (36.6 C)  TempSrc:      SpO2: 99% 100%  95%  Weight:   101.9 kg   Height:       General exam: awake, alert, no acute distress, obese HEENT: Dobhoff NG tube in place with tube feeds off, moist mucus membranes, hearing  grossly normal  Respiratory system: on room air, normal respiratory effort. Lungs clear Cardiovascular system: RRR, no lower extremity or pedal edema (resolved) Gastrointestinal system: abdomen is softer, LLQ tenderness much improved, no rebound tenderness or peritoneal signs, hypoactive bowel sounds Central nervous system: A&Ox4, no gross focal neurologic deficits, normal speech Extremities: moves all, no edema, normal  tone Skin: dry, intact, normal temperature Psychiatry: normal mood, congruent affect, judgement and insight appear normal     Data Reviewed: Notable labs --  Na 130 >> 131, glucose 182   Repeat CT abdomen/pelvis with IV contrast 1/29 --- persistent diverticulitis, slight enlargement of sigmoid fluid collection, suspected to be developing abscess, no extraluminal air.  Persistent pancreatitis changes with more confluent fluid collection anteriorly and inferiorly.  Repeat CT abdomen/pelvis with IV contrast 1/26  AM --- new sigmoid diverticulitis and more pancreatic fluid collections, but none are rim enhancing, no evidence of necrosis.    Family Communication: Updated husband at the bedside on rounds today  Disposition: Status is: Inpatient Remains inpatient appropriate because: On TPN, IV antibiotics.  Pt remains severely ill at this time as outlined above. Extended admission.   Planned Discharge Destination: Home with Home Health    Time spent: 36 minutes  Author: Ezekiel Slocumb, DO 09/28/2022 3:30 PM  For on call review [email or web address].

## 2022-09-29 DIAGNOSIS — K5720 Diverticulitis of large intestine with perforation and abscess without bleeding: Secondary | ICD-10-CM | POA: Diagnosis not present

## 2022-09-29 DIAGNOSIS — K859 Acute pancreatitis without necrosis or infection, unspecified: Secondary | ICD-10-CM | POA: Diagnosis not present

## 2022-09-29 LAB — COMPREHENSIVE METABOLIC PANEL
ALT: 22 U/L (ref 0–44)
AST: 21 U/L (ref 15–41)
Albumin: 2.6 g/dL — ABNORMAL LOW (ref 3.5–5.0)
Alkaline Phosphatase: 75 U/L (ref 38–126)
Anion gap: 6 (ref 5–15)
BUN: 21 mg/dL (ref 8–23)
CO2: 25 mmol/L (ref 22–32)
Calcium: 8.9 mg/dL (ref 8.9–10.3)
Chloride: 100 mmol/L (ref 98–111)
Creatinine, Ser: 0.78 mg/dL (ref 0.44–1.00)
GFR, Estimated: >60 mL/min (ref >60)
Glucose, Bld: 244 mg/dL — ABNORMAL HIGH (ref 70–99)
Potassium: 4.5 mmol/L (ref 3.5–5.1)
Sodium: 131 mmol/L — ABNORMAL LOW (ref 135–145)
Total Bilirubin: 0.8 mg/dL (ref 0.3–1.2)
Total Protein: 6.8 g/dL (ref 6.5–8.1)

## 2022-09-29 LAB — CBC
HCT: 33.2 % — ABNORMAL LOW (ref 36.0–46.0)
Hemoglobin: 10.9 g/dL — ABNORMAL LOW (ref 12.0–15.0)
MCH: 28.8 pg (ref 26.0–34.0)
MCHC: 32.8 g/dL (ref 30.0–36.0)
MCV: 87.6 fL (ref 80.0–100.0)
Platelets: 430 10*3/uL — ABNORMAL HIGH (ref 150–400)
RBC: 3.79 MIL/uL — ABNORMAL LOW (ref 3.87–5.11)
RDW: 14.6 % (ref 11.5–15.5)
WBC: 11.4 10*3/uL — ABNORMAL HIGH (ref 4.0–10.5)
nRBC: 0 % (ref 0.0–0.2)

## 2022-09-29 LAB — GLUCOSE, CAPILLARY
Glucose-Capillary: 237 mg/dL — ABNORMAL HIGH (ref 70–99)
Glucose-Capillary: 255 mg/dL — ABNORMAL HIGH (ref 70–99)
Glucose-Capillary: 293 mg/dL — ABNORMAL HIGH (ref 70–99)
Glucose-Capillary: 250 mg/dL — ABNORMAL HIGH (ref 70–99)

## 2022-09-29 LAB — TRIGLYCERIDES: Triglycerides: 105 mg/dL (ref <150)

## 2022-09-29 LAB — MAGNESIUM: Magnesium: 2.2 mg/dL (ref 1.7–2.4)

## 2022-09-29 LAB — HEPARIN LEVEL (UNFRACTIONATED): Heparin Unfractionated: 0.45 IU/mL (ref 0.30–0.70)

## 2022-09-29 LAB — PHOSPHORUS: Phosphorus: 3.1 mg/dL (ref 2.5–4.6)

## 2022-09-29 MED ORDER — HYDROMORPHONE HCL 1 MG/ML IJ SOLN
0.5000 mg | INTRAMUSCULAR | Status: DC | PRN
  Administered 2022-10-05: 0.5 mg via INTRAVENOUS
  Filled 2022-09-29: qty 1

## 2022-09-29 MED ORDER — TRAVASOL 10 % IV SOLN
INTRAVENOUS | Status: AC
  Filled 2022-09-29: qty 537.6

## 2022-09-29 MED ORDER — VITAL 1.5 CAL PO LIQD
1000.0000 mL | ORAL | Status: DC
  Administered 2022-09-29 – 2022-10-04 (×4): 1000 mL

## 2022-09-29 MED ORDER — INSULIN ASPART 100 UNIT/ML IJ SOLN
0.0000 [IU] | INTRAMUSCULAR | Status: DC
  Administered 2022-09-29 (×2): 8 [IU] via SUBCUTANEOUS
  Administered 2022-09-29: 5 [IU] via SUBCUTANEOUS
  Administered 2022-09-30 (×2): 8 [IU] via SUBCUTANEOUS
  Administered 2022-09-30: 5 [IU] via SUBCUTANEOUS
  Administered 2022-09-30: 8 [IU] via SUBCUTANEOUS
  Administered 2022-09-30: 5 [IU] via SUBCUTANEOUS
  Administered 2022-09-30: 3 [IU] via SUBCUTANEOUS
  Administered 2022-10-01 (×4): 2 [IU] via SUBCUTANEOUS
  Administered 2022-10-01 (×2): 3 [IU] via SUBCUTANEOUS
  Administered 2022-10-02 – 2022-10-05 (×18): 2 [IU] via SUBCUTANEOUS
  Filled 2022-09-29 (×29): qty 1

## 2022-09-29 NOTE — Progress Notes (Signed)
Nutrition Follow-up  DOCUMENTATION CODES:   Obesity unspecified  INTERVENTION:   -TPN management per pharmacy -TF via post-pyloric NGT:   Vital 1.5 @ 20 ml/hr and advance 10 ml/hr every 12 hours to goal rate of 55 ml/hr   30 ml Prosource TF daily.     30 ml free water flush every 4 hours   Tube feeding regimen provides 2040 kcals, 109 grams of protein, and 1008 ml of H2O. Total free water: 1188 ml daily  -Once TPN is d/c adjust free water flush to 170 ml every 4 hours to meet hydration needs  NUTRITION DIAGNOSIS:   Increased nutrient needs related to acute illness (pancreatitis) as evidenced by estimated needs.  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  1/23- post pyloric feeding tube placed (tip past duodenal bulb per x-ray)    1/26- TF held secondary to findings of diverticulitis with perforation, TPN initiated 1/31- trickle TF re-started 2/1- advanced to clear liquid diet  Reviewed I/O's: +203 ml x 24 hours and +15.4 L since admission  UOP: 2.4 L x 24 hours   Per general surgery notes, pt tolerating TF well. Plan to increase TF, wean TPN, and advance to clear liquid diet.   Pt remains on TPN for nutritional support. Receiving at goal rate of 80 ml/hr, which provides 2051 kcals and 108 grams protein, meeting 100% of estimated nutritional needs. Per discussion with pharmacist, plan to decrease to half rate tonight. Noted pt now with hyperglycemia- pharmacist plans to adjust SSI from every 4 hours to every 6 hours.    Medications reviewed and include vitamin B-12, cardizem, florastor, and senokot.   Labs reviewed: Na: 131, CBGS: 213-237 (inpatient orders for glycemic control are 0-15 units insulin aspart every 4 hours).   Diet Order:   Diet Order             Diet clear liquid Fluid consistency: Thin  Diet effective now                   EDUCATION NEEDS:   Education needs have been addressed  Skin:  Skin Assessment: Reviewed RN Assessment  Last BM:  09/21/22  Height:   Ht Readings from Last 1 Encounters:  09/15/22 5\' 8"  (1.727 m)    Weight:   Wt Readings from Last 1 Encounters:  09/28/22 101.9 kg    Ideal Body Weight:  63.6 kg  BMI:  Body mass index is 34.15 kg/m.  Estimated Nutritional Needs:   Kcal:  2050-2250  Protein:  105-120 grams  Fluid:  > 2 L    Loistine Chance, RD, LDN, [place] Registered Dietitian II Certified Diabetes Care and Education Specialist Please refer to [facility] for RD and/or RD on-call/weekend/after hours pager

## 2022-09-29 NOTE — Consult Note (Signed)
[facility] for IV Heparin Indication: atrial fibrillation  Patient Measurements: Height: 5\' 8"  (172.7 cm) Weight: 101.9 kg (224 lb 9.6 oz) IBW/kg (Calculated) : 63.9 Heparin Dosing Weight: 85.8 kg  Labs: Recent Labs    09/27/22 0648 09/28/22 0134 09/28/22 0859 09/29/22 0605  HGB 10.9* 11.0*  --   --   HCT 34.0* 34.0*  --   --   PLT 399 414*  --   --   HEPARINUNFRC 0.35 0.56  --  0.45  CREATININE 0.75  --  0.76  --     Estimated Creatinine Clearance: 82.9 mL/min (by C-G formula based on SCr of 0.76 mg/dL).  Medical History: Past Medical History:  Diagnosis Date   AF (atrial fibrillation) (HCC)    Anal fissure    Anxiety    Cardiac arrhythmia due to congenital heart disease    Chicken pox    Depression    GERD (gastroesophageal reflux disease)    Heart murmur     Medications:  Apixaban 5 mg BID (last dose 09/16/22 at 2251)  Assessment: 72 y/o F with medical history including Afib on apixaban who is admitted with acute pancreatitis. General surgery consulted. Pharmacy consulted to initiate and manage heparin infusion while apixaban is on hold pending surgery evaluation. Currently no need for surgical intervention.   1/20 1734 aPTT 79 before heparin started. No baseline heparin level ordered prior to giving dose of enoxaparin.  1/22 0816 aPTT 52 HL 0.71 1/22 1615 aPTT 58 1/22 2251 aPTT 51, subtherapeutic 1/23 0502 aPTT 80 therapeutic x 1, HL 0.62 1/23 1228 aPTT 77 HL 0.53 1/24 0428 HL 0.55 therapeutic x 3, aPTT 63 1/25 0642 HL 0.51, therapeutic x 4 1/26 0705 HL 0.35, therapeutic x 5 1/27 0413 HL 0.37, therapeutic x 6 1/28 0502 HL 0.34, therapeutic x 7 1/29 0522 HL 0.25, subtherapeutic 1/29 1448 HL 0.41, therapeutic x 1 1/29 2152 HL 0.54, therapeutic x 2 1/30 0648 HL 0.35, therapeutic.  1/31 0134 HL 0.56, therapeutic 2/1  0605 HL 0.45  Goal of Therapy:  Heparin level 0.3-0.7 units/ml Monitor platelets by anticoagulation  protocol: Yes  Plan: Heparin level remains therapeutic. Will continue heparin infusion at 1950 unit/hr. Recheck heparin level and CBC with AM labs.   Dorothe Pea, PharmD, BCPS Clinical Pharmacist   09/29/2022 6:29 AM

## 2022-09-29 NOTE — Progress Notes (Signed)
Progress Note   Patient: Molly Brown BPZ:[id number] DOB: 12-06-1950 DOA: 09/15/2022     14 DOS: the patient was seen and examined on 09/29/2022   Brief hospital course: 72 y.o. 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 Patient presents for evaluation of sudden onset abdominal pain mostly in the epigastrium, periumbilical area.  She rates her pain a 10 x 10 in intensity at its worst.  Pain is nonradiating and is associated with nausea and multiple episodes of emesis.  She states that she has had issues with abdominal pain in the past usually self-limiting but this is the worst pain she has had which prompted her visit to the emergency room.  She is unable to tell me if her prior episodes of pain related to meals.  She has urinary frequency and has dyspnea with exertion which is chronic.  Her last bowel movement was 2 days prior to this admission.  According to the patient she usually has daily bowel movements She denies having any fever, no chills, no chest pain, no headache, no dizziness, no lightheadedness, no cough, no blurred vision no focal deficit. Abnormal labs include lactic acid of 2.0, lipase 539, troponin 24, white count 15.8 CT scan of abdomen and pelvis shows acute interstitial pancreatitis. No evidence of pancreatic necrosis, pancreatic ductal dilation or walled-off collections. Calcifications of the mitral annulus with left atrial dilation, consider further evaluation with cardiac echo if not previously performed. Mild pulmonary edema. Aortic Atherosclerosis. Twelve-lead EKG reviewed by me shows rapid A-fib Patient received 2 L IV fluid bolus, Cardizem 20 mg IV push x 1 and is currently on a Cardizem drip.  1/19.   Tapered off Cardizem drip and on oral metoprolol and oral Cardizem CD.  Started empiric antibiotics for pancreatitis since procalcitonin elevated and leukocytosis. 1/20.  Consulted general surgery with ?gallstone pancreatitis.  Surgery ordered for a repeat CAT scan.  Holding Eliquis and started heparin drip just in case surgery needed. 1/21.  Patient pulled out her IV and wanted to go home.  Patient was reoriented and then agreeable to stay and to surgery if needed.  Advanced diet to full liquids. 1/22.  Bilirubin rising, MRCP showed acute interstitial edematous pancreatitis with peripancreatic fluid collections with some signs of hemorrhagic or proteinaceous change.  Some peripancreatic necrosis seen.  Pancreatic ascites.  Rather striking tapered narrowing of the mid common bile duct with biliary enhancement.  Dilated main pancreatic duct.  Findings may reflect pancreatic divisum or dominant dorsal drainage via minor papilla. 1/23.  Dobbhoff tube had to be advanced twice.  Last x-ray showing in the duodenal bulb.  Okay to start tube feedings.  1/24: further K replacement.  Mental status worse again today.  Tube feeds running and appears to be tolerating okay.  Stopped antibiotics to monitor. 1/25: pt sleeping a lot. Fever 100.4 F last night, recurrent leukocytosis also.  Resumed Zosyn.  Replaced phos.  Sodium declining slowly. Cardiology gave IV Lasix. 1/26: repeated CT, has more pancreatic fluid collections but no necrosis. New finding of acute diverticulitis with contained perforation. Surgery re-consulted.  Starting TPN and continuing antibiotics 1/27 >> 1/30:  On TPN, IV antibiotics.  Repeat CT slightly larger ?early abscess / fluid collection. Remains NPO. 1/31: surgery resuming Dobhoff trickle feeds. Leukocytosis and LLQ tenderness  improving.  Assessment and Plan: Severe acute pancreatitis Acute severe pancreatitis with increased fluid collections since prior CT but no evidence of necrosis on  repeat CT (1/26).   On empiric Zosyn - continuing for diverticulitis. General surgery no plan for cholecystectomy IgG4 pending.   The patient does not drink alcohol.  No new medications.  Triglycerides normal.  MRCP shows the possibility of pancreatic divisum which could increase risk of pancreatitis.   Lipase normalized pretty quickly. GI consulted, have signed off. Tube feeds were held for acute diverticulitis with contained perf. Per surgery, resume trickle feeds today. Monitor abdominal exam. Will need close GI follow up for ongoing care of pancreatitis and repeat imaging.  Acute diverticulitis Repeated CT 1/26 to re-assess pancreatitis and patient now has sigmoid diverticulitis with contained perforation.  She has point tenderness of LLQ on exam, no peritoneal signs --General surgery following --Continue TPN --Trial resume Dobhoff tube feeds per surgery --Continue Zosyn --NPO ex sips w meds or to wet mouth --Pain control, antiemetics PRN --Monitor abdominal exam closely --May require IR for abscess drainage but at this point, likely too small to target.  Surgery to discuss with IR.   Acute metabolic encephalopathy Resolved.  Had some delirium when more ill and getting IV pain meds. --Delirium precautions --Minimize sedating meds  Atrial fibrillation with RVR (HCC) Chronic in nature.  Continue metoprolol and Cardizem CD.   Currently on heparin drip, continue for CVA prevention.  Situational anxiety Pt reports significant anxiety and family note she has physical shaking at times when anxious.  She reports some baseline anxiety, but severely exacerbated by  her current situation. --Low dose IV Valium PRN for anxiety.  Resolved, post repletion, hypophosphatemia Last Phos 3.7 Monitor and further replace PRN.  Hyponatremia, improved Improved with diuresis.  Intermittent IV Lasix PRN, assess daily Monitor BMP and volume status.  1/30: Na 130.  No documented weights and  poor in/out charting. Difficult to assess fluid balance, but on exam she appears more dry today.   1/31: Na 131, w no fluids or diuresis --Continue to monitor --If not improving, she may need gentle hydration. Seems clinically more dry with resolved edema she had earlier  Resolved hypokalemia K 4.5 today Monitor BMP  Acute pancreatitis See severe acute pancreatitis  Obesity (BMI 30-39.9) Body mass index is 33.45 kg/m. Complicates overall care and prognosis.  Recommend lifestyle modifications including physical activity and diet for weight loss and overall long-term health.   HOCM (hypertrophic obstructive cardiomyopathy) ([place]) Patient with a history of HOCM Continue metoprolol.   Hypothyroidism Continue Synthroid  GERD (gastroesophageal reflux disease) Continue PPI         Physical Exam: Vitals:   09/29/22 0408 09/29/22 0408 09/29/22 0748 09/29/22 1435  BP: 125/76 125/76 119/79   Pulse: 81 82 75   Resp: 17 17 19    Temp: 98.1 F (36.7 C) 98.1 F (36.7 C) 97.7 F (36.5 C)   TempSrc:      SpO2: 97% 98% 95%   Weight:    102.3 kg  Height:       General exam: awake, alert, no acute distress, obese HEENT: Dobhoff NG tube in place with tube feeds off, moist mucus membranes, hearing grossly normal  Respiratory system: on room air, normal respiratory effort. Lungs clear Cardiovascular system: Regular rate and rhythm no rubs or gallops.   Gastrointestinal system: Abdomen is soft with bowel sounds present.   Central nervous system: A&Ox4, no gross focal neurologic deficits, normal speech Extremities: moves  all, no edema, normal tone Skin: dry, intact, normal temperature Psychiatry: normal mood, congruent affect, judgement and insight appear normal     Data Reviewed: Notable labs --  Na 130 >> 131, glucose 182   Repeat CT abdomen/pelvis with IV contrast 1/29 --- persistent diverticulitis, slight enlargement of sigmoid fluid collection, suspected to be developing  abscess, no extraluminal air.  Persistent pancreatitis changes with more confluent fluid collection anteriorly and inferiorly.  Repeat CT abdomen/pelvis with IV contrast 1/26  AM --- new sigmoid diverticulitis and more pancreatic fluid collections, but none are rim enhancing, no evidence of necrosis.    Family Communication: Updated husband at the bedside on rounds today  Disposition: Status is: Inpatient Remains inpatient appropriate because: On TPN, IV antibiotics.  Pt remains severely ill at this time as outlined above. Extended admission.   Planned Discharge Destination: Home with Home Health    Time spent: 36 minutes  Author: Kayleen Memos, DO 09/29/2022 3:54 PM  For on call review [email or web address].

## 2022-09-29 NOTE — Progress Notes (Signed)
[facility] SURGICAL PROGRESS NOTE (cpt [phone])  Hospital Day(s): 14.  Interval History:  Patient seen and examined No issues overnight Patient reports a good night; no abdominal pain No fever, chills, nausea, emesis  Leukocytosis improving now; 11.4K Labs are otherwise reassuring Continues on Zosyn Restarted trickle feeds; tolerated well NPO w/ TPN  Review of Systems:  Constitutional: denies fever, chills  HEENT: denies cough or congestion  Respiratory: denies any shortness of breath  Cardiovascular: denies chest pain or palpitations  Gastrointestinal: + abdominal pain (LLQ; seems to be improving), denied N/V Genitourinary: denies burning with urination or urinary frequency Musculoskeletal: denies pain, decreased motor or sensation  Vital signs in last 24 hours: [min-max] current  Temp:  [97.8 F (36.6 C)-98.3 F (36.8 C)] 98.1 F (36.7 C) (02/01 0408) Pulse Rate:  [78-85] 82 (02/01 0408) Resp:  [16-20] 17 (02/01 0408) BP: (96-125)/(67-76) 125/76 (02/01 0408) SpO2:  [95 %-100 %] 98 % (02/01 0408) Weight:  [101.9 kg] 101.9 kg (01/31 0842)     Height: 5\' 8"  (172.7 cm) Weight: 101.9 kg BMI (Calculated): 34.16   Intake/Output last 2 shifts:  01/31 0701 - 02/01 0700 In: 2603.6 [P.O.:80; I.V.:1420.6; NG/GT:1053; IV Piggyback:50] Out: 6333 [Urine:2400; Stool:1]   Physical Exam:  Constitutional: alert, cooperative and no distress  HENT: normocephalic without obvious abnormality, Dobbhoff in place Eyes: PERRL, EOM's grossly intact and symmetric  Respiratory: breathing non-labored at rest  Cardiovascular: regular rate and sinus rhythm  Gastrointestinal: soft, much improved LLQ pain, non-distended, no rebound/guarding. She is not overtly peritonitic nor toxic  Musculoskeletal: no edema or wounds, motor and sensation grossly intact, NT    Labs:     Latest Ref Rng & Units 09/29/2022    6:05 AM 09/28/2022    1:34 AM 09/27/2022    6:48 AM  CBC  WBC 4.0 - 10.5  K/uL 11.4  14.1  16.2   Hemoglobin 12.0 - 15.0 g/dL 10.9  11.0  10.9   Hematocrit 36.0 - 46.0 % 33.2  34.0  34.0   Platelets 150 - 400 K/uL 430  414  399       Latest Ref Rng & Units 09/29/2022    6:53 AM 09/28/2022    8:59 AM 09/27/2022    6:48 AM  CMP  Glucose 70 - 99 mg/dL 244  182  188   BUN 8 - 23 mg/dL 21  18  17    Creatinine 0.44 - 1.00 mg/dL 0.78  0.76  0.75   Sodium 135 - 145 mmol/L 131  131  130   Potassium 3.5 - 5.1 mmol/L 4.5  4.5  4.5   Chloride 98 - 111 mmol/L 100  100  101   CO2 22 - 32 mmol/L 25  28  25    Calcium 8.9 - 10.3 mg/dL 8.9  8.9  8.4   Total Protein 6.5 - 8.1 g/dL 6.8     Total Bilirubin 0.3 - 1.2 mg/dL 0.8     Alkaline Phos 38 - 126 U/L 75     AST 15 - 41 U/L 21     ALT 0 - 44 U/L 22       Imaging studies:  No new pertinent imaging studies    Assessment/Plan: (ICD-10's: K38.32) 72 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   - Okay to continue enteric feeds via Dobbhoff; advance as tolerated  - Okay for CLD  - Start to wean TPN - Continue IV Abx (  Zosyn) - No need for emergent surgical intervention currently. She understands that should she fail to improve or clinically deteriorate, we may need to consider intervention which would likely result in temporizing colostomy.   - Pending clinical condition, may need repeat CT Abdomen/Pelvis Friday (02/02).   - Monitor abdominal examination; on-going bowel function - Monitor leukocytosis; improving - Pain control prn; antiemetics prn - Okay to mobilize as tolerated/feasible  - Further management per primary service; we will follow    All of the above findings and recommendations were discussed with the patient, patient's family, and the medical team, and all of patient's and family's questions were answered to their expressed satisfaction.  -- Edison Simon, PA-C [facility] 09/29/2022, 7:39 AM M-F: 7am - 4pm

## 2022-09-29 NOTE — TOC Progression Note (Signed)
Transition of Care [facility]) - Progression Note    Patient Details  Name: Shayli Altemose MRN: [id number] Date of Birth: 09-Jul-1951  Transition of Care [facility]) CM/SW [place] [place] Phone Number: 09/29/2022, 11:06 AM  Clinical Narrative:     Patient is set up with [facility]  pending medical readiness to dc. No dme needs at this time.    TOC will continue to follow.    Expected Discharge Plan: [place] Barriers to Discharge: Continued Medical Work up  Expected Discharge Plan and Services       Living arrangements for the past 2 months: Single Family Home                                       Social Determinants of Health (SDOH) Interventions SDOH Screenings   Food Insecurity: No Food Insecurity (09/16/2022)  Housing: Low Risk  (09/16/2022)  Transportation Needs: No Transportation Needs (09/16/2022)  Utilities: Not At Risk (09/16/2022)  Tobacco Use: Medium Risk (09/26/2022)    Readmission Risk Interventions     No data to display

## 2022-09-29 NOTE — Consult Note (Addendum)
PHARMACY - TOTAL PARENTERAL NUTRITION CONSULT NOTE   Indication: Prolonged ileus  Patient Measurements: Height: 5\' 8"  (172.7 cm) Weight: 101.9 kg (224 lb 9.6 oz) IBW/kg (Calculated) : 63.9 TPN AdjBW (KG): 72.9 Body mass index is 34.15 kg/m.  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 Patient subsequently admitted for acute pancreatitis. Hospital course complicated by development of diverticulitis with contained perforation. Enteric feedings placed on hold. Pharmacy consulted to initiate and manage TPN for prolonged ileus.   Glucose / Insulin:  --Hgb A1c on 05/31/22 was 6% (pre-diabetes) --Patient has been mildly hyperglycemic on tube feeds but within goal range -BG  166 > 244  SSI per 24 hrs: 10 units  1/27: TG=162 > 118  Electrolytes:  -- Na 132 > 134 > 130 Renal:  --Scr < 1 Hepatic:  --Overall within normal limits, hyperbilirubinemia resolved Intake / Output; MIVF:  --I&Os have not been charted strictly --No MIVF GI Imaging: 1/26 CT abdomen / pelvis: Interval changes of acute diverticulitis involving the proximal sigmoid colon with a contained perforation. Increased peripancreatic edema and fluid including multiple developing fluid collections without surrounding rim enhancement at this time and no evidence of pancreatic necrosis. GI Surgeries / Procedures: N/A  Central access: PICC placed 1/26 TPN start date: 1/26  Nutritional Goals: Goal TPN rate is 80 mL/hr (provides 107 g of protein and 2050 kcals per day)  RD Assessment: Estimated Needs Total Energy Estimated Needs: 2050-2250 Total Protein Estimated Needs: 105-120 grams Total Fluid Estimated Needs: > 2 L  Current Nutrition:  TPN Tube feed stopped 1/26 -strict NPO/bowel rest, PO meds being given  Plan:  --Will start to wean TPN. Decrease the TPN to 40 ml/hr at 1800.  --Electrolytes in TPN: Na  62mEq/L, K 42mEq/L, Ca 28mEq/L, Mg 4mEq/L, and Phos 53mmol/L. Cl:Ac 1:1 --Patient tolerating TF. Plan to advance.   --Add standard MVI and trace elements to TPN, s/p thiamine 100 mg x 3 days  -discontinue po MVI for now --Initiate Moderate q4h SSI and adjust as needed. BG is elevated but weaning TPN will help. Adjusted SSI from q6h to q4H.  --Monitor TPN labs 1st 3 days then on Mon/Thurs, daily until stable  Oswald Hillock, PharmD, BCPS 09/29/2022,9:10 AM

## 2022-09-30 DIAGNOSIS — K5720 Diverticulitis of large intestine with perforation and abscess without bleeding: Secondary | ICD-10-CM | POA: Diagnosis not present

## 2022-09-30 DIAGNOSIS — K859 Acute pancreatitis without necrosis or infection, unspecified: Secondary | ICD-10-CM | POA: Diagnosis not present

## 2022-09-30 LAB — GLUCOSE, CAPILLARY
Glucose-Capillary: 161 mg/dL — ABNORMAL HIGH (ref 70–99)
Glucose-Capillary: 237 mg/dL — ABNORMAL HIGH (ref 70–99)
Glucose-Capillary: 247 mg/dL — ABNORMAL HIGH (ref 70–99)
Glucose-Capillary: 259 mg/dL — ABNORMAL HIGH (ref 70–99)
Glucose-Capillary: 269 mg/dL — ABNORMAL HIGH (ref 70–99)
Glucose-Capillary: 279 mg/dL — ABNORMAL HIGH (ref 70–99)
Glucose-Capillary: 296 mg/dL — ABNORMAL HIGH (ref 70–99)

## 2022-09-30 LAB — CBC
HCT: 33.8 % — ABNORMAL LOW (ref 36.0–46.0)
Hemoglobin: 11.1 g/dL — ABNORMAL LOW (ref 12.0–15.0)
MCH: 28.8 pg (ref 26.0–34.0)
MCHC: 32.8 g/dL (ref 30.0–36.0)
MCV: 87.6 fL (ref 80.0–100.0)
Platelets: 441 10*3/uL — ABNORMAL HIGH (ref 150–400)
RBC: 3.86 MIL/uL — ABNORMAL LOW (ref 3.87–5.11)
RDW: 14.6 % (ref 11.5–15.5)
WBC: 9.9 10*3/uL (ref 4.0–10.5)
nRBC: 0 % (ref 0.0–0.2)

## 2022-09-30 LAB — HEPARIN LEVEL (UNFRACTIONATED)
Heparin Unfractionated: >1.1 IU/mL — ABNORMAL HIGH (ref 0.30–0.70)
Heparin Unfractionated: 0.39 IU/mL (ref 0.30–0.70)

## 2022-09-30 MED ORDER — ORAL CARE MOUTH RINSE: 15.0000 mL | OROMUCOSAL | Status: DC | PRN

## 2022-09-30 MED ORDER — ORAL CARE MOUTH RINSE
15.0000 mL | OROMUCOSAL | Status: DC
  Administered 2022-09-30 – 2022-10-05 (×21): 15 mL via OROMUCOSAL

## 2022-09-30 MED ORDER — INSULIN ASPART 100 UNIT/ML IJ SOLN
3.0000 [IU] | INTRAMUSCULAR | Status: DC
  Administered 2022-09-30 – 2022-10-05 (×28): 3 [IU] via SUBCUTANEOUS
  Filled 2022-09-30 (×25): qty 1

## 2022-09-30 NOTE — Consult Note (Signed)
ANTICOAGULATION CONSULT NOTE  Pharmacy Consult for IV Heparin Indication: atrial fibrillation  Patient Measurements: Height: 5\' 8"  (172.7 cm) Weight: 102.3 kg (225 lb 8 oz) IBW/kg (Calculated) : 63.9 Heparin Dosing Weight: 85.8 kg  Labs: Recent Labs    09/27/22 0648 09/28/22 0134 09/28/22 0859 09/29/22 0605 09/29/22 0653 09/30/22 0221  HGB 10.9* 11.0*  --  10.9*  --  11.1*  HCT 34.0* 34.0*  --  33.2*  --  33.8*  PLT 399 414*  --  430*  --  441*  HEPARINUNFRC 0.35 0.56  --  0.45  --  >1.10*  CREATININE 0.75  --  0.76  --  0.78  --     Estimated Creatinine Clearance: 83.1 mL/min (by C-G formula based on SCr of 0.78 mg/dL).  Medical History: Past Medical History:  Diagnosis Date   AF (atrial fibrillation) (HCC)    Anal fissure    Anxiety    Cardiac arrhythmia due to congenital heart disease    Chicken pox    Depression    GERD (gastroesophageal reflux disease)    Heart murmur     Medications:  Apixaban 5 mg BID (last dose 09/16/22 at 2251)  Assessment: 72 y/o F with medical history including Afib on apixaban who is admitted with acute pancreatitis. General surgery consulted. Pharmacy consulted to initiate and manage heparin infusion while apixaban is on hold pending surgery evaluation. Currently no need for surgical intervention.   1/20 1734 aPTT 79 before heparin started. No baseline heparin level ordered prior to giving dose of enoxaparin.  1/22 0816 aPTT 52 HL 0.71 1/22 1615 aPTT 58 1/22 2251 aPTT 51, subtherapeutic 1/23 0502 aPTT 80 therapeutic x 1, HL 0.62 1/23 1228 aPTT 77 HL 0.53 1/24 0428 HL 0.55 therapeutic x 3, aPTT 63 1/25 0642 HL 0.51, therapeutic x 4 1/26 0705 HL 0.35, therapeutic x 5 1/27 0413 HL 0.37, therapeutic x 6 1/28 0502 HL 0.34, therapeutic x 7 1/29 0522 HL 0.25, subtherapeutic 1/29 1448 HL 0.41, therapeutic x 1 1/29 2152 HL 0.54, therapeutic x 2 1/30 0648 HL 0.35, therapeutic.  1/31 0134 HL 0.56, therapeutic 2/1  0605 HL 0.45 2/2  0221 HL >1.10 STAT RE-COLLECT 2/2 0541 HL 0.39  Goal of Therapy:  Heparin level 0.3-0.7 units/ml Monitor platelets by anticoagulation protocol: Yes  Plan:  Heparin level remains therapeutic. Will continue heparin infusion at 1950 unit/hr. Recheck heparin level and CBC with AM labs.   Dorothe Pea, PharmD, BCPS Clinical Pharmacist   09/30/2022 5:52 AM

## 2022-09-30 NOTE — Progress Notes (Signed)
Progress Note   Patient: Molly Brown ZOX:[id number] DOB: [birth year] DOA: 09/15/2022     15 DOS: the patient was seen and examined on 09/30/2022   Brief hospital course: 72 y.o. 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 Patient presents for evaluation of sudden onset abdominal pain mostly in the epigastrium, periumbilical area.  She rates her pain a 10 x 10 in intensity at its worst.  Pain is nonradiating and is associated with nausea and multiple episodes of emesis.  She states that she has had issues with abdominal pain in the past usually self-limiting but this is the worst pain she has had which prompted her visit to the emergency room.  She is unable to tell me if her prior episodes of pain related to meals.  She has urinary frequency and has dyspnea with exertion which is chronic.  Her last bowel movement was 2 days prior to this admission.  According to the patient she usually has daily bowel movements She denies having any fever, no chills, no chest pain, no headache, no dizziness, no lightheadedness, no cough, no blurred vision no focal deficit. Abnormal labs include lactic acid of 2.0, lipase 539, troponin 24, white count 15.8 CT scan of abdomen and pelvis shows acute interstitial pancreatitis. No evidence of pancreatic necrosis, pancreatic ductal dilation or walled-off collections. Calcifications of the mitral annulus with left atrial dilation, consider further evaluation with cardiac echo if not previously performed. Mild pulmonary edema. Aortic Atherosclerosis. Twelve-lead EKG reviewed by me shows rapid A-fib Patient received 2 L IV fluid bolus, Cardizem 20 mg IV push x 1 and is currently on a Cardizem drip.  1/19.   Tapered off Cardizem drip and on oral metoprolol and oral Cardizem CD.  Started empiric antibiotics for pancreatitis since procalcitonin elevated and leukocytosis. 1/20.  Consulted general surgery with ?gallstone pancreatitis.  Surgery ordered for a repeat CAT scan.  Holding Eliquis and started heparin drip just in case surgery needed. 1/21.  Patient pulled out her IV and wanted to go home.  Patient was reoriented and then agreeable to stay and to surgery if needed.  Advanced diet to full liquids. 1/22.  Bilirubin rising, MRCP showed acute interstitial edematous pancreatitis with peripancreatic fluid collections with some signs of hemorrhagic or proteinaceous change.  Some peripancreatic necrosis seen.  Pancreatic ascites.  Rather striking tapered narrowing of the mid common bile duct with biliary enhancement.  Dilated main pancreatic duct.  Findings may reflect pancreatic divisum or dominant dorsal drainage via minor papilla. 1/23.  Dobbhoff tube had to be advanced twice.  Last x-ray showing in the duodenal bulb.  Okay to start tube feedings.  1/24: further K replacement.  Mental status worse again today.  Tube feeds running and appears to be tolerating okay.  Stopped antibiotics to monitor. 1/25: pt sleeping a lot. Fever 100.4 F last night, recurrent leukocytosis also.  Resumed Zosyn.  Replaced phos.  Sodium declining slowly. Cardiology gave IV Lasix. 1/26: repeated CT, has more pancreatic fluid collections but no necrosis. New finding of acute diverticulitis with contained perforation. Surgery re-consulted.  Starting TPN and continuing antibiotics 1/27 >> 1/30:  On TPN, IV antibiotics.  Repeat CT slightly larger ?early abscess / fluid collection. Remains NPO. 1/31: surgery resuming Dobhoff trickle feeds. Leukocytosis and LLQ tenderness  improving.  09/30/2022: Seen at her bedside.  No acute events overnight.  Abdominal pain is improving 5 out of 10 from 7-8 out of 10.  She is tolerating a diet.  She has  no new complaints.  Assessment and Plan: Severe acute pancreatitis Acute severe pancreatitis with increased fluid collections since prior CT but no evidence of necrosis on repeat CT (1/26).   On empiric Zosyn - continuing for diverticulitis. General surgery no plan for cholecystectomy IgG4 pending.   The patient does not drink alcohol.  No new medications.  Triglycerides normal.  MRCP shows the possibility of pancreatic divisum which could increase risk of pancreatitis.   Lipase normalized pretty quickly. GI consulted, have signed off. Tube feeds were held for acute diverticulitis with contained perf. Per surgery, resume trickle feeds today. Monitor abdominal exam. Will need close GI follow up for ongoing care of pancreatitis and repeat imaging.  Acute diverticulitis Repeated CT 1/26 to re-assess pancreatitis and patient now has sigmoid diverticulitis with contained perforation.  She has point tenderness of LLQ on exam, no peritoneal signs --General surgery following --Continue TPN --Trial resume Dobhoff tube feeds per surgery --Continue Zosyn --NPO ex sips w meds or to wet mouth --Pain control, antiemetics PRN --Monitor abdominal exam closely --May require IR for abscess drainage but at this point, likely too small to target.  Surgery to discuss with IR.   Acute metabolic encephalopathy Resolved.  Had some delirium when more ill and getting IV pain meds. --Delirium precautions --Minimize sedating meds  Atrial fibrillation with RVR (HCC) Chronic in nature.  Continue metoprolol and Cardizem CD.   Currently on heparin drip, continue for CVA prevention.  Situational anxiety Pt reports significant anxiety and family note she has physical shaking at times when anxious.  She reports some baseline anxiety, but severely exacerbated by  her current situation. --Low dose IV Valium PRN for anxiety.  Resolved, post repletion, hypophosphatemia Last Phos 3.7 Monitor and further replace  PRN.  Hyponatremia, improved Improved with diuresis.  Intermittent IV Lasix PRN, assess daily Monitor BMP and volume status.  1/30: Na 130.  No documented weights and poor in/out charting. Difficult to assess fluid balance, but on exam she appears more dry today.   1/31: Na 131, w no fluids or diuresis --Continue to monitor --If not improving, she may need gentle hydration. Seems clinically more dry with resolved edema she had earlier  Resolved hypokalemia K 4.5 today Monitor BMP  Acute pancreatitis See severe acute pancreatitis  Obesity (BMI 30-39.9) Body mass index is 33.45 kg/m. Complicates overall care and prognosis.  Recommend lifestyle modifications including physical activity and diet for weight loss and overall long-term health.   HOCM (hypertrophic obstructive cardiomyopathy) ([place]) Patient with a history of HOCM Continue metoprolol.   Hypothyroidism Continue Synthroid  GERD (gastroesophageal reflux disease) Continue PPI         Physical Exam: Vitals:   09/30/22 0847 09/30/22 1246 09/30/22 1517 09/30/22 1712  BP:  108/74  120/70  Pulse:  88  91  Resp:  16  16  Temp:  98 F (36.7 C)  98.4 F (36.9 C)  TempSrc:      SpO2:  98%  100%  Weight: 102 kg  103.1 kg   Height:       General exam: awake, alert, no acute distress, obese HEENT: Dobhoff NG tube in place with tube feeds off, moist mucus membranes, hearing grossly normal  Respiratory system: on room air, normal respiratory effort. Lungs clear Cardiovascular  system: Regular rate and rhythm no rubs or gallops.   Gastrointestinal system: Abdomen is soft with bowel sounds present.   Central nervous system: A&Ox4, no gross focal neurologic deficits, normal speech Extremities: moves all, no edema, normal tone Skin: dry, intact, normal temperature Psychiatry: normal mood, congruent affect, judgement and insight appear normal     Data Reviewed: Notable labs --  Na 130 >> 131, glucose  182   Repeat CT abdomen/pelvis with IV contrast 1/29 --- persistent diverticulitis, slight enlargement of sigmoid fluid collection, suspected to be developing abscess, no extraluminal air.  Persistent pancreatitis changes with more confluent fluid collection anteriorly and inferiorly.  Repeat CT abdomen/pelvis with IV contrast 1/26  AM --- new sigmoid diverticulitis and more pancreatic fluid collections, but none are rim enhancing, no evidence of necrosis.    Family Communication: Updated husband at the bedside on rounds today  Disposition: Status is: Inpatient Remains inpatient appropriate because: On TPN, IV antibiotics.  Pt remains severely ill at this time as outlined above. Extended admission.   Planned Discharge Destination: Home with Home Health    Time spent: 36 minutes  Author: Kayleen Memos, DO 09/30/2022 6:50 PM  For on call review [email or web address].

## 2022-09-30 NOTE — Progress Notes (Signed)
Physical Therapy Treatment Patient Details Name: Molly Brown MRN: [id number] DOB: 1950/10/23 Today's Date: 09/30/2022   History of Present Illness Pt is a 72 y.o. 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  MD assessment includes: Acute metabolic encephalopathy, hypokalemia, and hypertrophic obstructive cardiomyopathy.    PT Comments    Pt received upright in bed agreeable to bed level PT. Pt reports need for bed change and also having nausea. Author states ability to assist pt in pt care and bed change however pt declining. Pt performs LE therex as below. Pt educated to continue OOB mobility to her abilities and tolerance. Pt and daughter at bedside report comfort with d/c'ing home still as d/c plan with family support. PT to continue to rec [facility] PT at discharge to improve strength and mobility with LRAD to return to independence.     Recommendations for follow up therapy are one component of a multi-disciplinary discharge planning process, led by the attending physician.  Recommendations may be updated based on patient status, additional functional criteria and insurance authorization.  Follow Up Recommendations  Home health PT     Assistance Recommended at Discharge Frequent or constant Supervision/Assistance  Patient can return home with the following A little help with walking and/or transfers;A little help with bathing/dressing/bathroom;Assistance with cooking/housework;Assist for transportation;Help with stairs or ramp for entrance   Equipment Recommendations  None recommended by PT    Recommendations for Other Services       Precautions / Restrictions Precautions Precautions: Fall Restrictions Weight Bearing Restrictions: No Other Position/Activity Restrictions: HOB to >/= 30 deg      Mobility  Bed Mobility                    Transfers                        Ambulation/Gait                   Stairs             Wheelchair Mobility    Modified Rankin (Stroke Patients Only)       Balance                                            Cognition Arousal/Alertness: Awake/alert Behavior During Therapy: WFL for tasks assessed/performed Overall Cognitive Status: Within Functional Limits for tasks assessed                                          Exercises General Exercises - Lower Extremity Ankle Circles/Pumps: AROM, Strengthening, Both, 15 reps, Supine Short Arc Quad: AROM, Strengthening, Both, 15 reps, Supine Heel Slides: AROM, Strengthening, Supine, Both, 15 reps Hip ABduction/ADduction: AROM, Supine, Strengthening, Both, 10 reps    General Comments        Pertinent Vitals/Pain Pain Assessment Pain Assessment: Faces Faces Pain Scale: Hurts a little bit Pain Location: abdominal area Pain Descriptors / Indicators: Aching, Sore Pain Intervention(s): Monitored during session    Home Living  Prior Function            PT Goals (current goals can now be found in the care plan section) Acute Rehab PT Goals Patient Stated Goal: To get stronger PT Goal Formulation: With patient Time For Goal Achievement: 10/04/22 Potential to Achieve Goals: Good Progress towards PT goals: Not progressing toward goals - comment (has not participated in [place] mobility for various reasons)    Frequency    Min 2X/week      PT Plan Current plan remains appropriate    Co-evaluation              AM-PAC PT "6 Clicks" Mobility   Outcome Measure  Help needed turning from your back to your side while in a flat bed without using bedrails?: A Little Help needed moving from lying on your back to sitting on the side of a flat bed without using bedrails?: A  Little Help needed moving to and from a bed to a chair (including a wheelchair)?: A Little Help needed standing up from a chair using your arms (e.g., wheelchair or bedside chair)?: A Little Help needed to walk in hospital room?: A Little Help needed climbing 3-5 steps with a railing? : A Lot 6 Click Score: 17    End of Session   Activity Tolerance: Other (comment) (nausea limiting session) Patient left: in bed;with call bell/phone within reach;with bed alarm set;with family/visitor present;with nursing/sitter in room Nurse Communication: Mobility status PT Visit Diagnosis: Muscle weakness (generalized) (M62.81);Difficulty in walking, not elsewhere classified (R26.2);Pain     Time: 6712-4580 PT Time Calculation (min) (ACUTE ONLY): 10 min  Charges:  $Therapeutic Exercise: 8-22 mins                     Salem Caster. Fairly IV, PT, DPT Physical Therapist- [facility]  09/30/2022, 2:10 PM

## 2022-09-30 NOTE — Consult Note (Signed)
PHARMACY - TOTAL PARENTERAL NUTRITION CONSULT NOTE   Indication: Prolonged ileus  Patient Measurements: Height: 5\' 8"  (172.7 cm) Weight: 102 kg (224 lb 12.8 oz) IBW/kg (Calculated) : 63.9 TPN AdjBW (KG): 72.9 Body mass index is 34.18 kg/m.  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 Patient subsequently admitted for acute pancreatitis. Hospital course complicated by development of diverticulitis with contained perforation. Enteric feedings placed on hold. Pharmacy consulted to initiate and manage TPN for prolonged ileus.   Glucose / Insulin:  --Hgb A1c on 05/31/22 was 6% (pre-diabetes) --Patient has been mildly hyperglycemic on tube feeds but within goal range -BG  166 > 244  SSI per 24 hrs: 10 units  1/27: TG=162 > 118  Electrolytes:  -- Na 132 > 134 > 130 Renal:  --Scr < 1 Hepatic:  --Overall within normal limits, hyperbilirubinemia resolved Intake / Output; MIVF:  --I&Os have not been charted strictly --No MIVF GI Imaging: 1/26 CT abdomen / pelvis: Interval changes of acute diverticulitis involving the proximal sigmoid colon with a contained perforation. Increased peripancreatic edema and fluid including multiple developing fluid collections without surrounding rim enhancement at this time and no evidence of pancreatic necrosis. GI Surgeries / Procedures: N/A  Central access: PICC placed 1/26 TPN start date: 1/26  Nutritional Goals: Goal TPN rate is 80 mL/hr (provides 107 g of protein and 2050 kcals per day)  RD Assessment: Estimated Needs Total Energy Estimated Needs: 2050-2250 Total Protein Estimated Needs: 105-120 grams Total Fluid Estimated Needs: > 2 L  Current Nutrition:  TPN -strict NPO/bowel rest, PO meds being given  Plan:  --Will stop TPN today at 1800..  --Patient tolerating TF. Plan to advance.   --Initiate Moderate q4h SSI and adjust as  needed. BG is elevated but weaning TPN will help. Adjusted SSI from q6h to q4H. Will add novolog 3 units for TF coverage.   Oswald Hillock, PharmD, BCPS 09/30/2022,10:29 AM

## 2022-09-30 NOTE — Inpatient Diabetes Management (Signed)
Inpatient Diabetes Program Recommendations  AACE/ADA: New Consensus Statement on Inpatient Glycemic Control  Target Ranges:  Prepandial:   less than 140 mg/dL      Peak postprandial:   less than 180 mg/dL (1-2 hours)      Critically ill patients:  140 - 180 mg/dL    Latest Reference Range & Units 09/30/22 00:38 09/30/22 05:11 09/30/22 08:36  Glucose-Capillary 70 - 99 mg/dL 259 (H) 247 (H) 237 (H)    Latest Reference Range & Units 09/29/22 05:11 09/29/22 11:06 09/29/22 16:41 09/29/22 20:06  Glucose-Capillary 70 - 99 mg/dL 237 (H) 255 (H) 293 (H) 250 (H)   Review of Glycemic Control  Diabetes history: NO Outpatient Diabetes medications: NA Current orders for Inpatient glycemic control: Novolog 0-15 units Q4H; Vital @ 55 ml/hr, TPN @ 40 ml/hr (to stop today when bag runs out)  Inpatient Diabetes Program Recommendations:    Insulin: Please consider ordering Novolog 3 units Q4H for tube feeding coverage. If tube feeding is stopped or held then Novolog tube feeding coverage should also be stopped or held.  NOTE: Patient does not have an hx of DM. Patient is currently NPO, getting TPN @ 40 ml/hr and Vital @ 55 ml/hr. Plan is to stop TPN today when bag runs out. Glucose has been consistently in 200's over past 24 hours. Anticipate glucose to improve some when TPN completely stopped. Would recommend ordering low dose Novolog tube feeding coverage Q4H which may need to be adjusted based on glucose trends after TPN is stopped completely. Inpatient diabetes team will follow along.  Thanks, Barnie Alderman, RN, MSN, [place] Diabetes Coordinator Inpatient Diabetes Program [phone] (Team Pager from 8am to [place])

## 2022-09-30 NOTE — Progress Notes (Signed)
[facility] SURGICAL PROGRESS NOTE (cpt [phone])  Hospital Day(s): 15.  Interval History:  Patient seen and examined No issues overnight Patient reports she is doing well No significant abdominal pain No fever, chills, nausea, emesis  Leukocytosis now resolved; 9.9K Hgb to 11.1 Labs are otherwise reassuring Continues on Zosyn Restarted trickle feeds; tolerated well CLD w/ TPN Sheis having bowel function   Review of Systems:  Constitutional: denies fever, chills  HEENT: denies cough or congestion  Respiratory: denies any shortness of breath  Cardiovascular: denies chest pain or palpitations  Gastrointestinal: + abdominal pain (markedly improved), denied N/V Genitourinary: denies burning with urination or urinary frequency Musculoskeletal: denies pain, decreased motor or sensation  Vital signs in last 24 hours: [min-max] current  Temp:  [97.7 F (36.5 C)-98.4 F (36.9 C)] 98.1 F (36.7 C) (02/02 0513) Pulse Rate:  [75-86] 81 (02/02 0513) Resp:  [17-19] 19 (02/02 0513) BP: (106-122)/(67-79) 106/71 (02/02 0513) SpO2:  [95 %-98 %] 97 % (02/02 0513) Weight:  [102.3 kg] 102.3 kg (02/01 1435)     Height: 5\' 8"  (172.7 cm) Weight: 102.3 kg BMI (Calculated): 34.3   Intake/Output last 2 shifts:  02/01 0701 - 02/02 0700 In: 803 [I.V.:803] Out: 3125 [Urine:3125]   Physical Exam:  Constitutional: alert, cooperative and no distress  HENT: normocephalic without obvious abnormality, Dobbhoff in place Eyes: PERRL, EOM's grossly intact and symmetric  Respiratory: breathing non-labored at rest  Cardiovascular: regular rate and sinus rhythm  Gastrointestinal: soft, much improved LLQ pain, non-distended, no rebound/guarding. She is not overtly peritonitic nor toxic  Musculoskeletal: no edema or wounds, motor and sensation grossly intact, NT    Labs:     Latest Ref Rng & Units 09/30/2022    2:21 AM 09/29/2022    6:05 AM 09/28/2022    1:34 AM  CBC  WBC 4.0 - 10.5 K/uL  9.9  11.4  14.1   Hemoglobin 12.0 - 15.0 g/dL 11.1  10.9  11.0   Hematocrit 36.0 - 46.0 % 33.8  33.2  34.0   Platelets 150 - 400 K/uL 441  430  414       Latest Ref Rng & Units 09/29/2022    6:53 AM 09/28/2022    8:59 AM 09/27/2022    6:48 AM  CMP  Glucose 70 - 99 mg/dL 244  182  188   BUN 8 - 23 mg/dL 21  18  17    Creatinine 0.44 - 1.00 mg/dL 0.78  0.76  0.75   Sodium 135 - 145 mmol/L 131  131  130   Potassium 3.5 - 5.1 mmol/L 4.5  4.5  4.5   Chloride 98 - 111 mmol/L 100  100  101   CO2 22 - 32 mmol/L 25  28  25    Calcium 8.9 - 10.3 mg/dL 8.9  8.9  8.4   Total Protein 6.5 - 8.1 g/dL 6.8     Total Bilirubin 0.3 - 1.2 mg/dL 0.8     Alkaline Phos 38 - 126 U/L 75     AST 15 - 41 U/L 21     ALT 0 - 44 U/L 22       Imaging studies:  No new pertinent imaging studies    Assessment/Plan: (ICD-10's: K2.32) 72 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   - Okay to continue enteric feeds via Dobb[place]; should be okay to maintain at 55 ml/hr  - Advance to FLD this morning   -  Discontinue TPN today (02/02) - Continue IV Abx (Zosyn); Day 7 for perforated diverticulitis  - No need for emergent surgical intervention currently. She understands that should she fail to improve or clinically deteriorate, we may need to consider intervention which would likely result in temporizing colostomy.   - Monitor abdominal examination; on-going bowel function - Monitor leukocytosis; now resolved (02/02) - Pain control prn; antiemetics prn - Okay to mobilize as tolerated/feasible  - Further management per primary service; we will follow    All of the above findings and recommendations were discussed with the patient, patient's family, and the medical team, and all of patient's and family's questions were answered to their expressed satisfaction.  -- Edison Simon, PA-C [facility] 09/30/2022, 7:36 AM M-F: 7am -  4pm

## 2022-09-30 NOTE — Progress Notes (Signed)
Nutrition Follow-up  DOCUMENTATION CODES:   Obesity unspecified  INTERVENTION:   -TPN management per pharmacy; plan to d/c today after bag runs out -TF via post-pyloric NGT:   Vital 1.5 @ 20 ml/hr and advance 10 ml/hr every 12 hours to goal rate of 55 ml/hr   30 ml Prosource TF daily.     30 ml free water flush every 4 hours   Tube feeding regimen provides 2040 kcals, 109 grams of protein, and 1008 ml of H2O. Total free water: 1188 ml daily   -Once TPN is d/c adjust free water flush to 170 ml every 4 hours to meet hydration needs  -Messaged DM coordinator for insulin recommendations for TF coverage  NUTRITION DIAGNOSIS:   Increased nutrient needs related to acute illness (pancreatitis) as evidenced by estimated needs.  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  1/23- post pyloric feeding tube placed (tip past duodenal bulb per x-ray)    1/26- TF held secondary to findings of diverticulitis with perforation, TPN initiated 1/31- trickle TF re-started 2/1- advanced to clear liquid diet 2/2/- advanced to full liquid diet  Reviewed I/O's: -2.3 L x 24 hours and +12.6 L since 09/16/22  UOP: 3.1 L x 24 hours   Pt continues to tolerate TF well. TF infusing at goal rate of 55 ml/hr.   Plan to d/c TPN today once bag runs out. Pt currently receiving TPN at 40 ml/hr, which provides 1025 kcals and 54 grams protein, meeting 50% of estimated kcal needs and 51% of protein needs.   Pt continues to have hyperglycemia despite insulin adjustments.  RD messaged DM coordinator for further recommendations.   No wt loss noted since admission.  Medications reviewed and include vitamin B-12, cardizem, florastor, and senokot.   Labs reviewed: Na: 131, CBGS: 237-247 (inpatient orders for glycemic control are 0-15 units insulin aspart every 4 hours).    Diet Order:   Diet Order             Diet full liquid Room service appropriate? Yes; Fluid consistency: Thin  Diet effective now                   EDUCATION NEEDS:   Education needs have been addressed  Skin:  Skin Assessment: Reviewed RN Assessment  Last BM:  09/29/22 (type 6)  Height:   Ht Readings from Last 1 Encounters:  09/15/22 5\' 8"  (1.727 m)    Weight:   Wt Readings from Last 1 Encounters:  09/30/22 102 kg    Ideal Body Weight:  63.6 kg  BMI:  Body mass index is 34.18 kg/m.  Estimated Nutritional Needs:   Kcal:  2050-2250  Protein:  105-120 grams  Fluid:  > 2 L    Loistine Chance, RD, LDN, [place] Registered Dietitian II Certified Diabetes Care and Education Specialist Please refer to [facility] for RD and/or RD on-call/weekend/after hours pager

## 2022-10-01 DIAGNOSIS — K859 Acute pancreatitis without necrosis or infection, unspecified: Secondary | ICD-10-CM | POA: Diagnosis not present

## 2022-10-01 DIAGNOSIS — K5720 Diverticulitis of large intestine with perforation and abscess without bleeding: Secondary | ICD-10-CM | POA: Diagnosis not present

## 2022-10-01 LAB — BASIC METABOLIC PANEL
Anion gap: 9 (ref 5–15)
BUN: 19 mg/dL (ref 8–23)
CO2: 24 mmol/L (ref 22–32)
Calcium: 9 mg/dL (ref 8.9–10.3)
Chloride: 97 mmol/L — ABNORMAL LOW (ref 98–111)
Creatinine, Ser: 0.85 mg/dL (ref 0.44–1.00)
GFR, Estimated: >60 mL/min (ref >60)
Glucose, Bld: 149 mg/dL — ABNORMAL HIGH (ref 70–99)
Potassium: 4.6 mmol/L (ref 3.5–5.1)
Sodium: 130 mmol/L — ABNORMAL LOW (ref 135–145)

## 2022-10-01 LAB — CBC
HCT: 34.4 % — ABNORMAL LOW (ref 36.0–46.0)
Hemoglobin: 11.3 g/dL — ABNORMAL LOW (ref 12.0–15.0)
MCH: 29.3 pg (ref 26.0–34.0)
MCHC: 32.8 g/dL (ref 30.0–36.0)
MCV: 89.1 fL (ref 80.0–100.0)
Platelets: 423 10*3/uL — ABNORMAL HIGH (ref 150–400)
RBC: 3.86 MIL/uL — ABNORMAL LOW (ref 3.87–5.11)
RDW: 14.8 % (ref 11.5–15.5)
WBC: 9.7 10*3/uL (ref 4.0–10.5)
nRBC: 0 % (ref 0.0–0.2)

## 2022-10-01 LAB — GLUCOSE, CAPILLARY
Glucose-Capillary: 135 mg/dL — ABNORMAL HIGH (ref 70–99)
Glucose-Capillary: 151 mg/dL — ABNORMAL HIGH (ref 70–99)
Glucose-Capillary: 146 mg/dL — ABNORMAL HIGH (ref 70–99)
Glucose-Capillary: 151 mg/dL — ABNORMAL HIGH (ref 70–99)
Glucose-Capillary: 146 mg/dL — ABNORMAL HIGH (ref 70–99)
Glucose-Capillary: 136 mg/dL — ABNORMAL HIGH (ref 70–99)

## 2022-10-01 LAB — MAGNESIUM: Magnesium: 2.2 mg/dL (ref 1.7–2.4)

## 2022-10-01 LAB — HEPARIN LEVEL (UNFRACTIONATED): Heparin Unfractionated: 0.63 IU/mL (ref 0.30–0.70)

## 2022-10-01 NOTE — Progress Notes (Signed)
10/01/2022  Subjective: No acute events overnight.  Patient was advanced to full liquid diet yesterday morning, but started having central pain again after lunch.  She was backed down to clear liquids for dinner and did well the rest of the night.  Continues on TF as well, and TPN was weaned off.  WBC remains normal this morning to 9.7.  Vital signs: Temp:  [97.5 F (36.4 C)-98.4 F (36.9 C)] 98.3 F (36.8 C) (02/03 0746) Pulse Rate:  [78-97] 91 (02/03 0746) Resp:  [13-22] 14 (02/03 1015) BP: (105-125)/(67-81) 105/72 (02/03 0746) SpO2:  [97 %-100 %] 98 % (02/03 0746) Weight:  [103.1 kg] 103.1 kg (02/02 1517)   Intake/Output: 02/02 0701 - 02/03 0700 In: 3681.3 [P.O.:360; I.V.:853.6; NG/GT:2207.8; IV Piggyback:259.9] Out: 950 [Urine:950] Last BM Date : 10/01/22  Physical Exam: Constitutional:  No acute distress Abdomen:  soft, non-distended, with soreness diffuse, but no significant tenderness and no peritonitis.  Labs:  Recent Labs    09/30/22 0221 10/01/22 0550  WBC 9.9 9.7  HGB 11.1* 11.3*  HCT 33.8* 34.4*  PLT 441* 423*   Recent Labs    09/29/22 0653  NA 131*  K 4.5  CL 100  CO2 25  GLUCOSE 244*  BUN 21  CREATININE 0.78  CALCIUM 8.9   No results for input(s): "LABPROT", "INR" in the last 72 hours.  Imaging: No results found.  Assessment/Plan: This is a 72 y.o. female with severe pancreatitis and perforated diverticulitis.  --It appears yesterday the full liquid diet may have been the reason for the pain.  She's been doing well with clear liquids.  For now, discussed that we would continue clear liquids today to continue giving her pancreas more rest.   --Continue TF for now, no need for TPN. --Continue IV abx for her perforated diverticulitis. --Will possibly try full liquids again tomorrow.   I spent 35 minutes dedicated to the care of this patient on the date of this encounter to include pre-visit review of records, face-to-face time with the patient  discussing diagnosis and management, and any post-visit coordination of care.  Melvyn Neth, [facility]

## 2022-10-01 NOTE — Consult Note (Signed)
ANTICOAGULATION CONSULT NOTE  Pharmacy Consult for IV Heparin Indication: atrial fibrillation  Patient Measurements: Height: 5\' 8"  (172.7 cm) Weight: 103.1 kg (227 lb 4.7 oz) IBW/kg (Calculated) : 63.9 Heparin Dosing Weight: 85.8 kg  Labs: Recent Labs    09/28/22 0859 09/29/22 0605 09/29/22 0605 09/29/22 0653 09/30/22 0221 09/30/22 0541 10/01/22 0550  HGB  --  10.9*   < >  --  11.1*  --  11.3*  HCT  --  33.2*  --   --  33.8*  --  34.4*  PLT  --  430*  --   --  441*  --  423*  HEPARINUNFRC  --  0.45   < >  --  >1.10* 0.39 0.63  CREATININE 0.76  --   --  0.78  --   --   --    < > = values in this interval not displayed.    Estimated Creatinine Clearance: 83.4 mL/min (by C-G formula based on SCr of 0.78 mg/dL).  Medical History: Past Medical History:  Diagnosis Date   AF (atrial fibrillation) (HCC)    Anal fissure    Anxiety    Cardiac arrhythmia due to congenital heart disease    Chicken pox    Depression    GERD (gastroesophageal reflux disease)    Heart murmur     Medications:  Apixaban 5 mg BID (last dose 09/16/22 at 2251)  Assessment: 72 y/o F with medical history including Afib on apixaban who is admitted with acute pancreatitis. General surgery consulted. Pharmacy consulted to initiate and manage heparin infusion while apixaban is on hold pending surgery evaluation. Currently no need for surgical intervention.   1/20 1734 aPTT 79 before heparin started. No baseline heparin level ordered prior to giving dose of enoxaparin.  1/22 0816 aPTT 52 HL 0.71 1/22 1615 aPTT 58 1/22 2251 aPTT 51, subtherapeutic 1/23 0502 aPTT 80 therapeutic x 1, HL 0.62 1/23 1228 aPTT 77 HL 0.53 1/24 0428 HL 0.55 therapeutic x 3, aPTT 63 1/25 0642 HL 0.51, therapeutic x 4 1/26 0705 HL 0.35, therapeutic x 5 1/27 0413 HL 0.37, therapeutic x 6 1/28 0502 HL 0.34, therapeutic x 7 1/29 0522 HL 0.25, subtherapeutic 1/29 1448 HL 0.41, therapeutic x 1 1/29 2152 HL 0.54, therapeutic x  2 1/30 0648 HL 0.35, therapeutic.  1/31 0134 HL 0.56, therapeutic 2/1  0605 HL 0.45 2/2 0221 HL >1.10 STAT RE-COLLECT 2/2 0541 HL 0.39 2/3 0550 HL 0.63  Goal of Therapy:  Heparin level 0.3-0.7 units/ml Monitor platelets by anticoagulation protocol: Yes  Plan:  Heparin level remains therapeutic. Will continue heparin infusion at 1950 unit/hr. Recheck heparin level and CBC with AM labs.   Dorothe Pea, PharmD, BCPS Clinical Pharmacist   10/01/2022 6:24 AM

## 2022-10-01 NOTE — Plan of Care (Signed)
Patient remained stable overnight. Tube feedings continued via [place], patient tolerating well. Patient had complaints of generalized pain which was managed per [facility]. Pt had large BM in the AM and continuing to pass gas. Heparin gtt continued along with IV abx. Patient received scheduled and sliding scale insulin.    Problem: Education: Goal: Knowledge of General Education information will improve Description: Including pain rating scale, medication(s)/side effects and non-pharmacologic comfort measures Outcome: Progressing   Problem: Health Behavior/Discharge Planning: Goal: Ability to manage health-related needs will improve Outcome: Progressing   Problem: Clinical Measurements: Goal: Ability to maintain clinical measurements within normal limits will improve Outcome: Progressing Goal: Will remain free from infection Outcome: Progressing Goal: Diagnostic test results will improve Outcome: Progressing Goal: Respiratory complications will improve Outcome: Progressing Goal: Cardiovascular complication will be avoided Outcome: Progressing   Problem: Activity: Goal: Risk for activity intolerance will decrease Outcome: Progressing   Problem: Nutrition: Goal: Adequate nutrition will be maintained Outcome: Progressing   Problem: Coping: Goal: Level of anxiety will decrease Outcome: Progressing   Problem: Elimination: Goal: Will not experience complications related to bowel motility Outcome: Progressing Goal: Will not experience complications related to urinary retention Outcome: Progressing   Problem: Pain Managment: Goal: General experience of comfort will improve Outcome: Progressing   Problem: Safety: Goal: Ability to remain free from injury will improve Outcome: Progressing   Problem: Skin Integrity: Goal: Risk for impaired skin integrity will decrease Outcome: Progressing

## 2022-10-01 NOTE — Progress Notes (Signed)
. Progress Note   Patient: Molly Brown QIO:[id number] DOB: [birth year] DOA: 09/15/2022     16 DOS: the patient was seen and examined on 10/01/2022   Brief hospital course: 72 y.o. 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 Patient presents for evaluation of sudden onset abdominal pain mostly in the epigastrium, periumbilical area.  She rates her pain a 10 x 10 in intensity at its worst.  Pain is nonradiating and is associated with nausea and multiple episodes of emesis.  She states that she has had issues with abdominal pain in the past usually self-limiting but this is the worst pain she has had which prompted her visit to the emergency room.  She is unable to tell me if her prior episodes of pain related to meals.  She has urinary frequency and has dyspnea with exertion which is chronic.  Her last bowel movement was 2 days prior to this admission.  According to the patient she usually has daily bowel movements She denies having any fever, no chills, no chest pain, no headache, no dizziness, no lightheadedness, no cough, no blurred vision no focal deficit. Abnormal labs include lactic acid of 2.0, lipase 539, troponin 24, white count 15.8 CT scan of abdomen and pelvis shows acute interstitial pancreatitis. No evidence of pancreatic necrosis, pancreatic ductal dilation or walled-off collections. Calcifications of the mitral annulus with left atrial dilation, consider further evaluation with cardiac echo if not previously performed. Mild pulmonary edema. Aortic Atherosclerosis. Twelve-lead EKG reviewed by me shows rapid A-fib Patient received 2 L IV fluid bolus, Cardizem 20 mg IV push x 1 and is currently on a Cardizem drip.  1/19.   Tapered off Cardizem drip and on oral metoprolol and oral Cardizem CD.  Started empiric antibiotics for pancreatitis since procalcitonin elevated and leukocytosis. 1/20.  Consulted general surgery with ?gallstone pancreatitis.  Surgery ordered for a repeat CAT scan.  Holding Eliquis and started heparin drip just in case surgery needed. 1/21.  Patient pulled out her IV and wanted to go home.  Patient was reoriented and then agreeable to stay and to surgery if needed.  Advanced diet to full liquids. 1/22.  Bilirubin rising, MRCP showed acute interstitial edematous pancreatitis with peripancreatic fluid collections with some signs of hemorrhagic or proteinaceous change.  Some peripancreatic necrosis seen.  Pancreatic ascites.  Rather striking tapered narrowing of the mid common bile duct with biliary enhancement.  Dilated main pancreatic duct.  Findings may reflect pancreatic divisum or dominant dorsal drainage via minor papilla. 1/23.  Dobbhoff tube had to be advanced twice.  Last x-ray showing in the duodenal bulb.  Okay to start tube feedings.  1/24: further K replacement.  Mental status worse again today.  Tube feeds running and appears to be tolerating okay.  Stopped antibiotics to monitor. 1/25: pt sleeping a lot. Fever 100.4 F last night, recurrent leukocytosis also.  Resumed Zosyn.  Replaced phos.  Sodium declining slowly. Cardiology gave IV Lasix. 1/26: repeated CT, has more pancreatic fluid collections but no necrosis. New finding of acute diverticulitis with contained perforation. Surgery re-consulted.  Starting TPN and continuing antibiotics 1/27 >> 1/30:  On TPN, IV antibiotics.  Repeat CT slightly larger ?early abscess / fluid collection. Remains NPO. 1/31: surgery resuming Dobhoff trickle feeds. Leukocytosis and LLQ  tenderness improving.  09/30/2022: Seen at her bedside.  No acute events overnight.  Abdominal pain is improving 5 out of 10 from 7-8 out of 10.  She is tolerating a diet.  She has  no new complaints.  2/3 : Patient seen and examined this morning.  No overnight events.  Patient continues to stay on tube feeding which she has been tolerating well.  Surgery plans to start the patient on liquid diet tomorrow.  Assessment and Plan: Severe acute pancreatitis Acute severe pancreatitis with increased fluid collections since prior CT but no evidence of necrosis on repeat CT (1/26).   On empiric Zosyn - continuing for diverticulitis. General surgery no plan for cholecystectomy IgG4 pending.   The patient does not drink alcohol.  No new medications.  Triglycerides normal.  MRCP shows the possibility of pancreatic divisum which could increase risk of pancreatitis.   Lipase normalized pretty quickly. GI consulted, have signed off. Tube feeds were held for acute diverticulitis with contained perf. Per surgery, patient continues to stay on tube feeds for now.  Liquid diet tomorrow. Will need close GI follow up for ongoing care of pancreatitis and repeat imaging.  Acute diverticulitis Repeated CT 1/26 to re-assess pancreatitis and patient now has sigmoid diverticulitis with contained perforation.  She has point tenderness of LLQ on exam, no peritoneal signs --General surgery following --Continue TPN --Trial resume Dobhoff tube feeds per surgery --Continue Zosyn --NPO ex sips w meds or to wet mouth --Pain control, antiemetics PRN --Monitor abdominal exam closely --May require IR for abscess drainage but at this point, likely too small to target.  Surgery to discuss with IR.   Acute metabolic encephalopathy Resolved.  Had some delirium when more ill and getting IV pain meds. --Delirium precautions --Minimize sedating meds  Atrial fibrillation with RVR (HCC) Chronic in nature.  Continue metoprolol and Cardizem CD.   Currently on heparin drip, continue for CVA prevention.  Situational anxiety Pt reports significant anxiety and family note she has physical shaking at times  when anxious.  She reports some baseline anxiety, but severely exacerbated by  her current situation. --Low dose IV Valium PRN for anxiety.  Resolved, post repletion, hypophosphatemia Last Phos 3.7 Monitor and further replace PRN.  Hyponatremia, improved Improved with diuresis.  Intermittent IV Lasix PRN, assess daily Monitor BMP and volume status.  1/30: Na 130.  No documented weights and poor in/out charting. Difficult to assess fluid balance, but on exam she appears more dry today.   1/31: Na 131, w no fluids or diuresis --Continue to monitor --If not improving, she may need gentle hydration. Seems clinically more dry with resolved edema she had earlier  Resolved hypokalemia K 4.5 today Monitor BMP  Acute pancreatitis See severe acute pancreatitis  Obesity (BMI 30-39.9) Body mass index is 33.45 kg/m. Complicates overall care and prognosis.  Recommend lifestyle modifications including physical activity and diet for weight loss and overall long-term health.   HOCM (hypertrophic obstructive cardiomyopathy) ([place]) Patient with a history of HOCM Continue metoprolol.   Hypothyroidism Continue Synthroid  GERD (gastroesophageal reflux disease) Continue PPI         Physical Exam: Vitals:   10/01/22 1209 10/01/22 1228 10/01/22 1300 10/01/22 1304  BP:  107/82    Pulse:  93    Resp: (!) 23 16 14 14   Temp:  (!) 97.5 F (36.4 C)    TempSrc:  Oral    SpO2:  99%    Weight:      Height:  General exam: awake, alert, no acute distress, obese HEENT: Dobhoff NG tube in place with tube feeds off, moist mucus membranes, hearing grossly normal  Respiratory system: on room air, normal respiratory effort. Lungs clear Cardiovascular system: Regular rate and rhythm no rubs or gallops.   Gastrointestinal system: Abdomen is soft with bowel sounds present.   Central nervous system: A&Ox4, no gross focal neurologic deficits, normal speech Extremities: moves all, no edema,  normal tone Skin: dry, intact, normal temperature Psychiatry: normal mood, congruent affect, judgement and insight appear normal     Data Reviewed: Notable labs --  Na 130 >> 131, glucose 182   Repeat CT abdomen/pelvis with IV contrast 1/29 --- persistent diverticulitis, slight enlargement of sigmoid fluid collection, suspected to be developing abscess, no extraluminal air.  Persistent pancreatitis changes with more confluent fluid collection anteriorly and inferiorly.  Repeat CT abdomen/pelvis with IV contrast 1/26  AM --- new sigmoid diverticulitis and more pancreatic fluid collections, but none are rim enhancing, no evidence of necrosis.    Family Communication: Updated husband at the bedside on rounds today  Disposition: Status is: Inpatient Remains inpatient appropriate because: On TPN, IV antibiotics.  Pt remains severely ill at this time as outlined above. Extended admission.   Planned Discharge Destination: Home with Home Health    Time spent: 36 minutes  Author: Oran Rein, MD 10/01/2022 1:45 PM  For on call review [email or web address].

## 2022-10-02 DIAGNOSIS — K5732 Diverticulitis of large intestine without perforation or abscess without bleeding: Secondary | ICD-10-CM

## 2022-10-02 DIAGNOSIS — K859 Acute pancreatitis without necrosis or infection, unspecified: Secondary | ICD-10-CM | POA: Diagnosis not present

## 2022-10-02 DIAGNOSIS — K5720 Diverticulitis of large intestine with perforation and abscess without bleeding: Secondary | ICD-10-CM | POA: Diagnosis not present

## 2022-10-02 LAB — CBC
HCT: 36.8 % (ref 36.0–46.0)
Hemoglobin: 12 g/dL (ref 12.0–15.0)
MCH: 29 pg (ref 26.0–34.0)
MCHC: 32.6 g/dL (ref 30.0–36.0)
MCV: 88.9 fL (ref 80.0–100.0)
Platelets: 438 10*3/uL — ABNORMAL HIGH (ref 150–400)
RBC: 4.14 MIL/uL (ref 3.87–5.11)
RDW: 15.3 % (ref 11.5–15.5)
WBC: 9 10*3/uL (ref 4.0–10.5)
nRBC: 0 % (ref 0.0–0.2)

## 2022-10-02 LAB — GLUCOSE, CAPILLARY
Glucose-Capillary: 118 mg/dL — ABNORMAL HIGH (ref 70–99)
Glucose-Capillary: 130 mg/dL — ABNORMAL HIGH (ref 70–99)
Glucose-Capillary: 133 mg/dL — ABNORMAL HIGH (ref 70–99)
Glucose-Capillary: 143 mg/dL — ABNORMAL HIGH (ref 70–99)
Glucose-Capillary: 145 mg/dL — ABNORMAL HIGH (ref 70–99)
Glucose-Capillary: 147 mg/dL — ABNORMAL HIGH (ref 70–99)
Glucose-Capillary: 149 mg/dL — ABNORMAL HIGH (ref 70–99)

## 2022-10-02 LAB — HEPARIN LEVEL (UNFRACTIONATED)
Heparin Unfractionated: 0.75 IU/mL — ABNORMAL HIGH (ref 0.30–0.70)
Heparin Unfractionated: 0.77 IU/mL — ABNORMAL HIGH (ref 0.30–0.70)
Heparin Unfractionated: 0.84 IU/mL — ABNORMAL HIGH (ref 0.30–0.70)

## 2022-10-02 NOTE — Progress Notes (Signed)
.. Progress Note   Patient: Molly Brown KGY:[id number] DOB: 07-07-1951 DOA: 09/15/2022     17 DOS: the patient was seen and examined on 10/02/2022   Brief hospital course: 72 y.o. 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 Patient presents for evaluation of sudden onset abdominal pain mostly in the epigastrium, periumbilical area.  She rates her pain a 10 x 10 in intensity at its worst.  Pain is nonradiating and is associated with nausea and multiple episodes of emesis.  She states that she has had issues with abdominal pain in the past usually self-limiting but this is the worst pain she has had which prompted her visit to the emergency room.  She is unable to tell me if her prior episodes of pain related to meals.  She has urinary frequency and has dyspnea with exertion which is chronic.  Her last bowel movement was 2 days prior to this admission.  According to the patient she usually has daily bowel movements She denies having any fever, no chills, no chest pain, no headache, no dizziness, no lightheadedness, no cough, no blurred vision no focal deficit. Abnormal labs include lactic acid of 2.0, lipase 539, troponin 24, white count 15.8 CT scan of abdomen and pelvis shows acute interstitial pancreatitis. No evidence of pancreatic necrosis, pancreatic ductal dilation or walled-off collections. Calcifications of the mitral annulus with left atrial dilation, consider further evaluation with cardiac echo if not previously performed. Mild pulmonary edema. Aortic Atherosclerosis. Twelve-lead EKG reviewed by me shows rapid A-fib Patient received 2 L IV fluid bolus, Cardizem 20 mg IV push x 1 and is currently on a Cardizem drip.  1/19.   Tapered off Cardizem drip and on oral metoprolol and oral Cardizem CD.  Started empiric antibiotics for pancreatitis since procalcitonin elevated and leukocytosis. 1/20.  Consulted general surgery with ?gallstone pancreatitis.  Surgery ordered for a repeat CAT scan.  Holding Eliquis and started heparin drip just in case surgery needed. 1/21.  Patient pulled out her IV and wanted to go home.  Patient was reoriented and then agreeable to stay and to surgery if needed.  Advanced diet to full liquids. 1/22.  Bilirubin rising, MRCP showed acute interstitial edematous pancreatitis with peripancreatic fluid collections with some signs of hemorrhagic or proteinaceous change.  Some peripancreatic necrosis seen.  Pancreatic ascites.  Rather striking tapered narrowing of the mid common bile duct with biliary enhancement.  Dilated main pancreatic duct.  Findings may reflect pancreatic divisum or dominant dorsal drainage via minor papilla. 1/23.  Dobbhoff tube had to be advanced twice.  Last x-ray showing in the duodenal bulb.  Okay to start tube feedings.  1/24: further K replacement.  Mental status worse again today.  Tube feeds running and appears to be tolerating okay.  Stopped antibiotics to monitor. 1/25: pt sleeping a lot. Fever 100.4 F last night, recurrent leukocytosis also.  Resumed Zosyn.  Replaced phos.  Sodium declining slowly. Cardiology gave IV Lasix. 1/26: repeated CT, has more pancreatic fluid collections but no necrosis. New finding of acute diverticulitis with contained perforation. Surgery re-consulted.  Starting TPN and continuing antibiotics 1/27 >> 1/30:  On TPN, IV antibiotics.  Repeat CT slightly larger ?early abscess / fluid collection. Remains NPO. 1/31: surgery resuming Dobhoff trickle feeds. Leukocytosis and LLQ  tenderness improving.  09/30/2022: Seen at her bedside.  No acute events overnight.  Abdominal pain is improving 5 out of 10 from 7-8 out of 10.  She is tolerating a diet.  She has  no new complaints.  2/3 : Patient seen and examined this morning.  No overnight events.  Patient continues to stay on tube feeding which she has been tolerating well.  Surgery plans to start the patient on liquid diet tomorrow.  2/4 : Patient seen and examined this morning.  Symptomatically feeling better no acute events overnight no complaint of abdominal pain.  Tolerating tube feeds well.  Labs with no leukocytosis.  Surgery to advance her diet to full liquid today no further advancement.  If she develops pain recommends to go back to clear liquid.   Assessment and Plan: Severe acute pancreatitis Acute severe pancreatitis with increased fluid collections since prior CT but no evidence of necrosis on repeat CT (1/26).   On empiric Zosyn - continuing for diverticulitis. General surgery no plan for cholecystectomy IgG4 pending.   The patient does not drink alcohol.  No new medications.  Triglycerides normal.  MRCP shows the possibility of pancreatic divisum which could increase risk of pancreatitis.   Lipase normalized pretty quickly. GI consulted, have signed off. Tube feeds were held for acute diverticulitis with contained perf. Per surgery, patient continues to stay on tube feeds for now.  Liquid diet tomorrow.->  Advance to full liquid today Will need close GI follow up for ongoing care of pancreatitis and repeat imaging.  Acute diverticulitis Repeated CT 1/26 to re-assess pancreatitis and patient now has sigmoid diverticulitis with contained perforation.  She has point tenderness of LLQ on exam, no peritoneal signs --General surgery following --Continue TPN -- on Dobhoff tube feeds per surgery --Continue Zosyn --Pain control, antiemetics PRN --Monitor abdominal exam closely --May require IR for abscess drainage but at this point, likely too small to target.  Surgery to discuss with IR.   Acute metabolic encephalopathy Resolved.  Had some delirium when more ill and getting IV pain  meds. --Delirium precautions --Minimize sedating meds  Atrial fibrillation with RVR (HCC) Chronic in nature.  Continue metoprolol and Cardizem CD.   Currently on heparin drip, continue for CVA prevention. Holding apixaban till patient is able to completely resume p.o. diet.  Situational anxiety Pt reports significant anxiety and family note she has physical shaking at times when anxious.  She reports some baseline anxiety, but severely exacerbated by  her current situation. --Low dose IV Valium PRN for anxiety.  Resolved, post repletion, hypophosphatemia Last Phos 3.7 Monitor and further replace PRN.  Hyponatremia, improved Improved with diuresis.  Intermittent IV Lasix PRN, assess daily Monitor BMP and volume status.  1/30: Na 130.  No documented weights and poor in/out charting. Difficult to assess fluid balance, but on exam she appears more dry today.   1/31: Na 131, w no fluids or diuresis --Continue to monitor --If not improving, she may need gentle hydration. Seems clinically more dry with resolved edema she had earlier  Resolved hypokalemia K 4.5 today Monitor BMP  Acute pancreatitis See severe acute pancreatitis  Obesity (BMI 30-39.9) Body mass index is 33.45 kg/m. Complicates overall care and prognosis.  Recommend lifestyle modifications including physical activity and diet for weight loss and overall long-term health.   HOCM (hypertrophic obstructive cardiomyopathy) ([place]) Patient with a history of HOCM Continue metoprolol.   Hypothyroidism Continue Synthroid  GERD (gastroesophageal reflux disease) Continue PPI  Physical Exam: Vitals:   10/01/22 2210 10/02/22 0249 10/02/22 0458 10/02/22 0948  BP:  117/61 116/71 104/70  Pulse:  74 82 79  Resp: (!) 23 16 20 18   Temp:  98 F (36.7 C) 98 F (36.7 C) (!) 97.4 F (36.3 C)  TempSrc:  Oral Oral   SpO2:  97% 98% 96%  Weight:      Height:       General exam: awake, alert, no acute  distress, obese HEENT: Dobhoff NG tube in place with tube feeds off, moist mucus membranes, hearing grossly normal  Respiratory system: on room air, normal respiratory effort. Lungs clear Cardiovascular system: Regular rate and rhythm no rubs or gallops.   Gastrointestinal system: Abdomen is soft with bowel sounds present.   Central nervous system: A&Ox4, no gross focal neurologic deficits, normal speech Extremities: moves all, no edema, normal tone Skin: dry, intact, normal temperature Psychiatry: normal mood, congruent affect, judgement and insight appear normal     Data Reviewed: Notable labs --  Na 130 >> 131, glucose 182   Repeat CT abdomen/pelvis with IV contrast 1/29 --- persistent diverticulitis, slight enlargement of sigmoid fluid collection, suspected to be developing abscess, no extraluminal air.  Persistent pancreatitis changes with more confluent fluid collection anteriorly and inferiorly.  Repeat CT abdomen/pelvis with IV contrast 1/26  AM --- new sigmoid diverticulitis and more pancreatic fluid collections, but none are rim enhancing, no evidence of necrosis.    Family Communication: Updated husband at the bedside on rounds today  Disposition: Status is: Inpatient Remains inpatient appropriate because: On TPN, IV antibiotics.  Pt remains severely ill at this time as outlined above. Extended admission.   Planned Discharge Destination: Home with Home Health    Time spent: 36 minutes  Author: Oran Rein, MD 10/02/2022 12:20 PM  For on call review [email or web address].

## 2022-10-02 NOTE — Consult Note (Signed)
ANTICOAGULATION CONSULT NOTE  Pharmacy Consult for IV Heparin Indication: atrial fibrillation  Patient Measurements: Height: 5\' 8"  (172.7 cm) Weight: 103.1 kg (227 lb 4.7 oz) IBW/kg (Calculated) : 63.9 Heparin Dosing Weight: 85.8 kg  Labs: Recent Labs    09/29/22 0653 09/30/22 0221 09/30/22 0221 09/30/22 0541 10/01/22 0550 10/01/22 1359 10/02/22 0523  HGB  --  11.1*   < >  --  11.3*  --  12.0  HCT  --  33.8*  --   --  34.4*  --  36.8  PLT  --  441*  --   --  423*  --  438*  HEPARINUNFRC  --  >1.10*   < > 0.39 0.63  --  0.75*  CREATININE 0.78  --   --   --   --  0.85  --    < > = values in this interval not displayed.    Estimated Creatinine Clearance: 78.5 mL/min (by C-G formula based on SCr of 0.85 mg/dL).  Medical History: Past Medical History:  Diagnosis Date   AF (atrial fibrillation) (HCC)    Anal fissure    Anxiety    Cardiac arrhythmia due to congenital heart disease    Chicken pox    Depression    GERD (gastroesophageal reflux disease)    Heart murmur     Medications:  Apixaban 5 mg BID (last dose 09/16/22 at 2251)  Assessment: 72 y/o F with medical history including Afib on apixaban who is admitted with acute pancreatitis. General surgery consulted. Pharmacy consulted to initiate and manage heparin infusion while apixaban is on hold pending surgery evaluation. Currently no need for surgical intervention.   1/20 1734 aPTT 79 before heparin started. No baseline heparin level ordered prior to giving dose of enoxaparin.  1/22 0816 aPTT 52 HL 0.71 1/22 1615 aPTT 58 1/22 2251 aPTT 51, subtherapeutic 1/23 0502 aPTT 80 therapeutic x 1, HL 0.62 1/23 1228 aPTT 77 HL 0.53 1/24 0428 HL 0.55 therapeutic x 3, aPTT 63 1/25 0642 HL 0.51, therapeutic x 4 1/26 0705 HL 0.35, therapeutic x 5 1/27 0413 HL 0.37, therapeutic x 6 1/28 0502 HL 0.34, therapeutic x 7 1/29 0522 HL 0.25, subtherapeutic 1/29 1448 HL 0.41, therapeutic x 1 1/29 2152 HL 0.54, therapeutic x  2 1/30 0648 HL 0.35, therapeutic.  1/31 0134 HL 0.56, therapeutic 2/1  0605 HL 0.45 2/2 0221 HL >1.10 STAT RE-COLLECT 2/2 0541 HL 0.39 2/3 0550 HL 0.63 2/4 0523 HL 0.75, Supratherapeutic   Goal of Therapy:  Heparin level 0.3-0.7 units/ml Monitor platelets by anticoagulation protocol: Yes  Plan: Heparin level Supratherapeutic. Will decrease heparin infusion to  1850 unit/hr. Recheck heparin level 6 hours following rate change. CBC with AM labs.   Dorothe Pea, PharmD, BCPS Clinical Pharmacist   10/02/2022 6:23 AM

## 2022-10-02 NOTE — Progress Notes (Signed)
10/02/2022  Subjective: No acute events.  Patient tolerating a clear liquid diet well without worsening pain.  Tolerating tube feeds well too.  WBC remains normal at 9.0  Vital signs: Temp:  [97.4 F (36.3 C)-98 F (36.7 C)] 97.4 F (36.3 C) (02/04 0948) Pulse Rate:  [74-93] 79 (02/04 0948) Resp:  [14-24] 18 (02/04 0948) BP: (104-121)/(61-82) 104/70 (02/04 0948) SpO2:  [96 %-100 %] 96 % (02/04 0948)   Intake/Output: 02/03 0701 - 02/04 0700 In: 2931.4 [P.O.:480; I.V.:420.2; NG/GT:1819.8; IV Piggyback:211.3] Out: 1600 [Urine:1600] Last BM Date : 10/01/22  Physical Exam: Constitutional:  No acute distress Abdomen:  soft, non-distended, appropriately sore to palpation.  No diffuse peritonitis or significant tenderness on exam.  Labs:  Recent Labs    10/01/22 0550 10/02/22 0523  WBC 9.7 9.0  HGB 11.3* 12.0  HCT 34.4* 36.8  PLT 423* 438*   Recent Labs    10/01/22 1359  NA 130*  K 4.6  CL 97*  CO2 24  GLUCOSE 149*  BUN 19  CREATININE 0.85  CALCIUM 9.0   No results for input(s): "LABPROT", "INR" in the last 72 hours.  Imaging: No results found.  Assessment/Plan: This is a 72 y.o. female with severe pancreatitis and perforated diverticulitis.  --Patient continues to do well and has been improving from both her pancreatitis and diverticulitis.  She's tolerated a clear liquid diet and also tube feeds at goal rate. --Will advance again today to a full liquid diet.  Discussed taking it slow and see how her pancreas tolerates this.  Would not advance further today.  Discussed also that if any worsening pain, would back down again to clears. --continue IV abx --Continue heparin gtt for now.  Would hold off on converting to po anticoagulation until we know she's able to advance her po diet.   I spent 35 minutes dedicated to the care of this patient on the date of this encounter to include pre-visit review of records, face-to-face time with the patient discussing diagnosis  and management, and any post-visit coordination of care.  Melvyn Neth, [facility]

## 2022-10-02 NOTE — Consult Note (Signed)
ANTICOAGULATION CONSULT NOTE  Pharmacy Consult for IV Heparin Indication: atrial fibrillation  Patient Measurements: Height: 5\' 8"  (172.7 cm) Weight: 103.1 kg (227 lb 4.7 oz) IBW/kg (Calculated) : 63.9 Heparin Dosing Weight: 85.8 kg  Labs: Recent Labs    09/30/22 0221 09/30/22 0541 10/01/22 0550 10/01/22 1359 10/02/22 0523 10/02/22 1452 10/02/22 2154  HGB 11.1*  --  11.3*  --  12.0  --   --   HCT 33.8*  --  34.4*  --  36.8  --   --   PLT 441*  --  423*  --  438*  --   --   HEPARINUNFRC >1.10*   < > 0.63  --  0.75* 0.77* 0.84*  CREATININE  --   --   --  0.85  --   --   --    < > = values in this interval not displayed.    Estimated Creatinine Clearance: 78.5 mL/min (by C-G formula based on SCr of 0.85 mg/dL).  Medical History: Past Medical History:  Diagnosis Date   AF (atrial fibrillation) (HCC)    Anal fissure    Anxiety    Cardiac arrhythmia due to congenital heart disease    Chicken pox    Depression    GERD (gastroesophageal reflux disease)    Heart murmur     Medications:  Apixaban 5 mg BID (last dose 09/16/22 at 2251)  Assessment: 72 y/o F with medical history including Afib on apixaban who is admitted with acute pancreatitis. General surgery consulted. Pharmacy consulted to initiate and manage heparin infusion while apixaban is on hold pending surgery evaluation. Currently no need for surgical intervention.   1/20 1734 aPTT 79 before heparin started. No baseline heparin level ordered prior to giving dose of enoxaparin.  1/22 0816 aPTT 52 HL 0.71 1/22 1615 aPTT 58 1/22 2251 aPTT 51, subtherapeutic 1/23 0502 aPTT 80 therapeutic x 1, HL 0.62 1/23 1228 aPTT 77 HL 0.53 1/24 0428 HL 0.55 therapeutic x 3, aPTT 63 1/25 0642 HL 0.51, therapeutic x 4 1/26 0705 HL 0.35, therapeutic x 5 1/27 0413 HL 0.37, therapeutic x 6 1/28 0502 HL 0.34, therapeutic x 7 1/29 0522 HL 0.25, subtherapeutic 1/29 1448 HL 0.41, therapeutic x 1 1/29 2152 HL 0.54, therapeutic x  2 1/30 0648 HL 0.35, therapeutic.  1/31 0134 HL 0.56, therapeutic 2/1  0605 HL 0.45 2/2 0221 HL >1.10 STAT RE-COLLECT 2/2 0541 HL 0.39 2/3 0550 HL 0.63 2/4 0523 HL 0.75, Supratherapeutic  2/4 1452 HL 0.77, Supratherapeutic @ 1850 units/hr 2/4 2154 HL 0.84, Supratherapeutic @ 1750 units/hr  Goal of Therapy:  Heparin level 0.3-0.7 units/ml Monitor platelets by anticoagulation protocol: Yes  Plan: - Heparin level Supratherapeutic.  - Will decrease heparin infusion to  1550 unit/hr.  - Recheck heparin level 6 hours following rate change.  - CBC with AM labs.   Dorothe Pea, PharmD Clinical Pharmacist   10/02/2022 10:25 PM

## 2022-10-02 NOTE — Consult Note (Signed)
ANTICOAGULATION CONSULT NOTE  Pharmacy Consult for IV Heparin Indication: atrial fibrillation  Patient Measurements: Height: 5\' 8"  (172.7 cm) Weight: 103.1 kg (227 lb 4.7 oz) IBW/kg (Calculated) : 63.9 Heparin Dosing Weight: 85.8 kg  Labs: Recent Labs    09/30/22 0221 09/30/22 0541 10/01/22 0550 10/01/22 1359 10/02/22 0523 10/02/22 1452  HGB 11.1*  --  11.3*  --  12.0  --   HCT 33.8*  --  34.4*  --  36.8  --   PLT 441*  --  423*  --  438*  --   HEPARINUNFRC >1.10*   < > 0.63  --  0.75* 0.77*  CREATININE  --   --   --  0.85  --   --    < > = values in this interval not displayed.    Estimated Creatinine Clearance: 78.5 mL/min (by C-G formula based on SCr of 0.85 mg/dL).  Medical History: Past Medical History:  Diagnosis Date   AF (atrial fibrillation) (HCC)    Anal fissure    Anxiety    Cardiac arrhythmia due to congenital heart disease    Chicken pox    Depression    GERD (gastroesophageal reflux disease)    Heart murmur     Medications:  Apixaban 5 mg BID (last dose 09/16/22 at 2251)  Assessment: 72 y/o F with medical history including Afib on apixaban who is admitted with acute pancreatitis. General surgery consulted. Pharmacy consulted to initiate and manage heparin infusion while apixaban is on hold pending surgery evaluation. Currently no need for surgical intervention.   1/20 1734 aPTT 79 before heparin started. No baseline heparin level ordered prior to giving dose of enoxaparin.  1/22 0816 aPTT 52 HL 0.71 1/22 1615 aPTT 58 1/22 2251 aPTT 51, subtherapeutic 1/23 0502 aPTT 80 therapeutic x 1, HL 0.62 1/23 1228 aPTT 77 HL 0.53 1/24 0428 HL 0.55 therapeutic x 3, aPTT 63 1/25 0642 HL 0.51, therapeutic x 4 1/26 0705 HL 0.35, therapeutic x 5 1/27 0413 HL 0.37, therapeutic x 6 1/28 0502 HL 0.34, therapeutic x 7 1/29 0522 HL 0.25, subtherapeutic 1/29 1448 HL 0.41, therapeutic x 1 1/29 2152 HL 0.54, therapeutic x 2 1/30 0648 HL 0.35, therapeutic.  1/31  0134 HL 0.56, therapeutic 2/1  0605 HL 0.45 2/2 0221 HL >1.10 STAT RE-COLLECT 2/2 0541 HL 0.39 2/3 0550 HL 0.63 2/4 0523 HL 0.75, Supratherapeutic  2/4 1452 HL 0.77, Supratherapeutic @ 1850 units/hr  Goal of Therapy:  Heparin level 0.3-0.7 units/ml Monitor platelets by anticoagulation protocol: Yes  Plan: - Heparin level Supratherapeutic.  - Will decrease heparin infusion to  1750 unit/hr.  - Recheck heparin level 6 hours following rate change.  - CBC with AM labs.   Pearla Dubonnet, PharmD Clinical Pharmacist   10/02/2022 3:32 PM

## 2022-10-03 DIAGNOSIS — K859 Acute pancreatitis without necrosis or infection, unspecified: Secondary | ICD-10-CM | POA: Diagnosis not present

## 2022-10-03 DIAGNOSIS — K5792 Diverticulitis of intestine, part unspecified, without perforation or abscess without bleeding: Secondary | ICD-10-CM | POA: Diagnosis not present

## 2022-10-03 DIAGNOSIS — I4891 Unspecified atrial fibrillation: Secondary | ICD-10-CM | POA: Diagnosis not present

## 2022-10-03 LAB — CBC
HCT: 34.5 % — ABNORMAL LOW (ref 36.0–46.0)
Hemoglobin: 11.3 g/dL — ABNORMAL LOW (ref 12.0–15.0)
MCH: 29.4 pg (ref 26.0–34.0)
MCHC: 32.8 g/dL (ref 30.0–36.0)
MCV: 89.8 fL (ref 80.0–100.0)
Platelets: 410 10*3/uL — ABNORMAL HIGH (ref 150–400)
RBC: 3.84 MIL/uL — ABNORMAL LOW (ref 3.87–5.11)
RDW: 15.6 % — ABNORMAL HIGH (ref 11.5–15.5)
WBC: 9 10*3/uL (ref 4.0–10.5)
nRBC: 0 % (ref 0.0–0.2)

## 2022-10-03 LAB — COMPREHENSIVE METABOLIC PANEL
ALT: 27 U/L (ref 0–44)
AST: 24 U/L (ref 15–41)
Albumin: 2.7 g/dL — ABNORMAL LOW (ref 3.5–5.0)
Alkaline Phosphatase: 77 U/L (ref 38–126)
Anion gap: 9 (ref 5–15)
BUN: 17 mg/dL (ref 8–23)
CO2: 25 mmol/L (ref 22–32)
Calcium: 9.2 mg/dL (ref 8.9–10.3)
Chloride: 98 mmol/L (ref 98–111)
Creatinine, Ser: 0.92 mg/dL (ref 0.44–1.00)
GFR, Estimated: >60 mL/min (ref >60)
Glucose, Bld: 135 mg/dL — ABNORMAL HIGH (ref 70–99)
Potassium: 4.6 mmol/L (ref 3.5–5.1)
Sodium: 132 mmol/L — ABNORMAL LOW (ref 135–145)
Total Bilirubin: 0.6 mg/dL (ref 0.3–1.2)
Total Protein: 6.8 g/dL (ref 6.5–8.1)

## 2022-10-03 LAB — GLUCOSE, CAPILLARY
Glucose-Capillary: 125 mg/dL — ABNORMAL HIGH (ref 70–99)
Glucose-Capillary: 131 mg/dL — ABNORMAL HIGH (ref 70–99)
Glucose-Capillary: 129 mg/dL — ABNORMAL HIGH (ref 70–99)
Glucose-Capillary: 129 mg/dL — ABNORMAL HIGH (ref 70–99)
Glucose-Capillary: 139 mg/dL — ABNORMAL HIGH (ref 70–99)
Glucose-Capillary: 94 mg/dL (ref 70–99)

## 2022-10-03 LAB — HEPARIN LEVEL (UNFRACTIONATED)
Heparin Unfractionated: 0.54 IU/mL (ref 0.30–0.70)
Heparin Unfractionated: 0.59 IU/mL (ref 0.30–0.70)

## 2022-10-03 LAB — MAGNESIUM: Magnesium: 2.1 mg/dL (ref 1.7–2.4)

## 2022-10-03 LAB — PHOSPHORUS: Phosphorus: 4.5 mg/dL (ref 2.5–4.6)

## 2022-10-03 LAB — TRIGLYCERIDES: Triglycerides: 118 mg/dL (ref <150)

## 2022-10-03 NOTE — Progress Notes (Addendum)
[facility] SURGICAL PROGRESS NOTE (cpt [phone])  Hospital Day(s): 18.   Interval History: Patient seen and examined, no acute events or new complaints overnight. Patient reports she did have LLQ this AM; just took pain medications. She denies fever, chills, nausea, emesis. She remains without a leukocytosis; 9.0K. Renal function is normal; sCr - 0.92; UO - 900 ccs. No electrolyte derangements. She is on FLD. Also with TF at 55 ml/hr  Review of Systems:  Constitutional: denies fever, chills  HEENT: denies cough or congestion  Respiratory: denies any shortness of breath  Cardiovascular: denies chest pain or palpitations  Gastrointestinal: + abdominal pain (LLQ), denied N/V Genitourinary: denies burning with urination or urinary frequency Musculoskeletal: denies pain, decreased motor or sensation  Vital signs in last 24 hours: [min-max] current  Temp:  [97.4 F (36.3 C)-98.2 F (36.8 C)] 97.9 F (36.6 C) (02/05 0746) Pulse Rate:  [75-89] 81 (02/05 0746) Resp:  [17-20] 18 (02/05 0746) BP: (102-125)/(65-83) 118/67 (02/05 0746) SpO2:  [94 %-100 %] 94 % (02/05 0746)     Height: 5\' 8"  (244.0 cm) Weight: 103.1 kg BMI (Calculated): 34.57   Intake/Output last 2 shifts:  02/04 0701 - 02/05 0700 In: 1654.3 [NG/GT:1481.3; IV Piggyback:173] Out: 900 [Urine:900]   Physical Exam:  Constitutional: alert, cooperative and no distress  HENT: normocephalic without obvious abnormality, Dobbhoff in place Eyes: PERRL, EOM's grossly intact and symmetric  Respiratory: breathing non-labored at rest  Cardiovascular: regular rate and sinus rhythm  Gastrointestinal: soft, still with LLQ pain this morning but mild, non-distended, no rebound/guarding. She is not overtly peritonitic nor toxic  Musculoskeletal: no edema or wounds, motor and sensation grossly intact, NT    Labs:     Latest Ref Rng & Units 10/03/2022    4:50 AM 10/02/2022    5:23 AM 10/01/2022    5:50 AM  CBC  WBC 4.0 - 10.5  K/uL 9.0  9.0  9.7   Hemoglobin 12.0 - 15.0 g/dL 11.3  12.0  11.3   Hematocrit 36.0 - 46.0 % 34.5  36.8  34.4   Platelets 150 - 400 K/uL 410  438  423       Latest Ref Rng & Units 10/03/2022    4:50 AM 10/01/2022    1:59 PM 09/29/2022    6:53 AM  CMP  Glucose 70 - 99 mg/dL 135  149  244   BUN 8 - 23 mg/dL 17  19  21    Creatinine 0.44 - 1.00 mg/dL 0.92  0.85  0.78   Sodium 135 - 145 mmol/L 132  130  131   Potassium 3.5 - 5.1 mmol/L 4.6  4.6  4.5   Chloride 98 - 111 mmol/L 98  97  100   CO2 22 - 32 mmol/L 25  24  25    Calcium 8.9 - 10.3 mg/dL 9.2  9.0  8.9   Total Protein 6.5 - 8.1 g/dL 6.8   6.8   Total Bilirubin 0.3 - 1.2 mg/dL 0.6   0.8   Alkaline Phos 38 - 126 U/L 77   75   AST 15 - 41 U/L 24   21   ALT 0 - 44 U/L 27   22     Imaging studies: No new pertinent imaging studies   Assessment/Plan: (ICD-10's: K71.32) 72 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               - Will continue  FLD for now given question of pain; she is difficult to read some mornings. If she does well today, can consider soft diet for lunch/dinner  - Continue TF for now at 15 m l/hr. When she advances to soft diet, we can wean from these and reassess need for Dobbhoff pending diet toleration.   - ? Role for another repeat CT; no clinical evidence to suggest deterioration - Continue IV Abx (Zosyn); Day 10/14 for perforated diverticulitis  - No need for emergent surgical intervention currently. She understands that should she fail to improve or clinically deteriorate, we may need to consider intervention which would likely result in temporizing colostomy.              - Monitor abdominal examination; on-going bowel function - Monitor leukocytosis; remains resolved (02/02) - Pain control prn; antiemetics prn - Okay to mobilize as tolerated/feasible             - Further management per primary service; we will follow  All of the  above findings and recommendations were discussed with the patient, patient's family, and the medical team, and all of patient's and family's questions were answered to their expressed satisfaction.  -- Edison Simon, PA-C [facility] 10/03/2022, 7:48 AM M-F: 7am - 4pm

## 2022-10-03 NOTE — Progress Notes (Signed)
Physical Therapy Treatment Patient Details Name: Molly Brown MRN: [id number] DOB: November 02, 1950 Today's Date: 10/03/2022   History of Present Illness Pt is a 72 y.o. 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  MD assessment includes: Acute metabolic encephalopathy, hypokalemia, and hypertrophic obstructive cardiomyopathy.    PT Comments    Pt received upright in recliner agreeable to PT. RN assisting in pausing TPN for mobility. Pt dons boots in sitting. Pt able to stand at supervision level. She is reliant on at least SUE support on IV pole and ambulates with limited step through due to wide BOS of IV Pole. Pt maintains ~68' of gait at minguard level. She has x2 minor, L lateral LOB during gait relying on minA to correct balance. Pt returns to sitting in recliner then endorsing need for BM thus performing step pivot to [facility] at minguard level. Pt with all needs in reach, in care of visitor as pt reports getting family help since admission with toileting. Pt making great progress functionally today. VSS throughout. Closer to d/c will decide on possible DME needs.    Recommendations for follow up therapy are one component of a multi-disciplinary discharge planning process, led by the attending physician.  Recommendations may be updated based on patient status, additional functional criteria and insurance authorization.  Follow Up Recommendations  Home health PT     Assistance Recommended at Discharge Frequent or constant Supervision/Assistance  Patient can return home with the following A little help with walking and/or transfers;A little help with bathing/dressing/bathroom;Assistance with cooking/housework;Assist for transportation;Help with stairs or ramp for entrance   Equipment Recommendations  Other  (comment) (TBD closer to discharge)    Recommendations for Other Services       Precautions / Restrictions Precautions Precautions: Fall Restrictions Weight Bearing Restrictions: No Other Position/Activity Restrictions: HOB to >/= 30 deg     Mobility  Bed Mobility                 Patient Response: Cooperative  Transfers                   General transfer comment: NT. in Recliner pre and post session.    Ambulation/Gait Ambulation/Gait assistance: Min guard Gait Distance (Feet): 68 Feet Assistive device: IV Pole Gait Pattern/deviations: Step-through pattern, Decreased step length - right, Decreased step length - left       General Gait Details: limited step through due to IV pole's base. x2 minor L lateral LOB needing PT minA to correct but otherwise steady ambulation.   Stairs             Wheelchair Mobility    Modified Rankin (Stroke Patients Only)       Balance Overall balance assessment: Needs assistance   Sitting balance-Leahy Scale: Normal     Standing balance support: Bilateral upper extremity supported, During functional activity Standing balance-Leahy Scale: Fair Standing balance comment: reliant on at least SUE support                            Cognition Arousal/Alertness: Awake/alert Behavior During Therapy: WFL for tasks assessed/performed Overall Cognitive Status: Within Functional Limits for tasks assessed  Exercises      General Comments        Pertinent Vitals/Pain Pain Assessment Pain Assessment: Faces Faces Pain Scale: Hurts a little bit Pain Location: abdominal area Pain Descriptors / Indicators: Aching, Sore Pain Intervention(s): Limited activity within patient's tolerance, Monitored during session, Repositioned    Home Living                          Prior Function            PT Goals (current goals can now be found  in the care plan section) Acute Rehab PT Goals Patient Stated Goal: To get stronger PT Goal Formulation: With patient Time For Goal Achievement: 10/04/22 Potential to Achieve Goals: Good Progress towards PT goals: Progressing toward goals    Frequency           PT Plan Current plan remains appropriate    Co-evaluation              AM-PAC PT "6 Clicks" Mobility   Outcome Measure  Help needed turning from your back to your side while in a flat bed without using bedrails?: A Little Help needed moving from lying on your back to sitting on the side of a flat bed without using bedrails?: A Little Help needed moving to and from a bed to a chair (including a wheelchair)?: A Little Help needed standing up from a chair using your arms (e.g., wheelchair or bedside chair)?: A Little Help needed to walk in hospital room?: A Little Help needed climbing 3-5 steps with a railing? : A Lot 6 Click Score: 17    End of Session Equipment Utilized During Treatment: Gait belt Activity Tolerance: Patient tolerated treatment well Patient left: with family/visitor present (seated on [facility]) Nurse Communication: Mobility status PT Visit Diagnosis: Muscle weakness (generalized) (M62.81);Difficulty in walking, not elsewhere classified (R26.2);Pain Pain - part of body:  (abdomen)     Time: 4098-1191 PT Time Calculation (min) (ACUTE ONLY): 20 min  Charges:  $Therapeutic Exercise: 8-22 mins                     Salem Caster. Fairly IV, PT, DPT Physical Therapist- [facility]  10/03/2022, 11:18 AM

## 2022-10-03 NOTE — Consult Note (Signed)
ANTICOAGULATION CONSULT NOTE  Pharmacy Consult for IV Heparin Indication: atrial fibrillation  Patient Measurements: Height: 5\' 8"  (172.7 cm) Weight: 103.1 kg (227 lb 4.7 oz) IBW/kg (Calculated) : 63.9 Heparin Dosing Weight: 85.8 kg  Labs: Recent Labs    10/01/22 0550 10/01/22 1359 10/02/22 0523 10/02/22 1452 10/02/22 2154 10/03/22 0450  HGB 11.3*  --  12.0  --   --  11.3*  HCT 34.4*  --  36.8  --   --  34.5*  PLT 423*  --  438*  --   --  410*  HEPARINUNFRC 0.63  --  0.75* 0.77* 0.84* 0.54  CREATININE  --  0.85  --   --   --   --     Estimated Creatinine Clearance: 78.5 mL/min (by C-G formula based on SCr of 0.85 mg/dL).  Medical History: Past Medical History:  Diagnosis Date   AF (atrial fibrillation) (HCC)    Anal fissure    Anxiety    Cardiac arrhythmia due to congenital heart disease    Chicken pox    Depression    GERD (gastroesophageal reflux disease)    Heart murmur     Medications:  Apixaban 5 mg BID (last dose 09/16/22 at 2251)  Assessment: 72 y/o F with medical history including Afib on apixaban who is admitted with acute pancreatitis. General surgery consulted. Pharmacy consulted to initiate and manage heparin infusion while apixaban is on hold pending surgery evaluation. Currently no need for surgical intervention.   1/20 1734 aPTT 79 before heparin started. No baseline heparin level ordered prior to giving dose of enoxaparin.  1/22 0816 aPTT 52 HL 0.71 1/22 1615 aPTT 58 1/22 2251 aPTT 51, subtherapeutic 1/23 0502 aPTT 80 therapeutic x 1, HL 0.62 1/23 1228 aPTT 77 HL 0.53 1/24 0428 HL 0.55 therapeutic x 3, aPTT 63 1/25 0642 HL 0.51, therapeutic x 4 1/26 0705 HL 0.35, therapeutic x 5 1/27 0413 HL 0.37, therapeutic x 6 1/28 0502 HL 0.34, therapeutic x 7 1/29 0522 HL 0.25, subtherapeutic 1/29 1448 HL 0.41, therapeutic x 1 1/29 2152 HL 0.54, therapeutic x 2 1/30 0648 HL 0.35, therapeutic.  1/31 0134 HL 0.56, therapeutic 2/1  0605 HL 0.45 2/2  0221 HL >1.10 STAT RE-COLLECT 2/2 0541 HL 0.39 2/3 0550 HL 0.63 2/4 0523 HL 0.75, Supratherapeutic  2/4 1452 HL 0.77, Supratherapeutic @ 1850 units/hr 2/4 2154 HL 0.84, Supratherapeutic @ 1750 units/hr 2/5 0450 HL 0.54, Therapeutic   Goal of Therapy:  Heparin level 0.3-0.7 units/ml Monitor platelets by anticoagulation protocol: Yes  Plan: - Heparin level therapeutic.  - Continue heparin infusion at 1550 unit/hr.  - Recheck heparin level 6 hours to confirm - CBC with AM labs.   Dorothe Pea, PharmD Clinical Pharmacist   10/03/2022 5:20 AM

## 2022-10-03 NOTE — Progress Notes (Signed)
ANTICOAGULATION CONSULT NOTE  Pharmacy Consult for IV Heparin Indication: atrial fibrillation  Patient Measurements: Height: 5\' 8"  (172.7 cm) Weight: 101.6 kg (224 lb) IBW/kg (Calculated) : 63.9 Heparin Dosing Weight: 85.8 kg  Labs: Recent Labs    10/01/22 0550 10/01/22 1359 10/02/22 0523 10/02/22 1452 10/02/22 2154 10/03/22 0450 10/03/22 1120  HGB 11.3*  --  12.0  --   --  11.3*  --   HCT 34.4*  --  36.8  --   --  34.5*  --   PLT 423*  --  438*  --   --  410*  --   HEPARINUNFRC 0.63  --  0.75*   < > 0.84* 0.54 0.59  CREATININE  --  0.85  --   --   --  0.92  --    < > = values in this interval not displayed.    Estimated Creatinine Clearance: 72 mL/min (by C-G formula based on SCr of 0.92 mg/dL).  Medical History: Past Medical History:  Diagnosis Date   AF (atrial fibrillation) (HCC)    Anal fissure    Anxiety    Cardiac arrhythmia due to congenital heart disease    Chicken pox    Depression    GERD (gastroesophageal reflux disease)    Heart murmur     Medications:  Apixaban 5 mg BID (last dose 09/16/22 at 2251)  Assessment: 72 y/o F with medical history including Afib on apixaban who is admitted with acute pancreatitis. General surgery consulted. Pharmacy consulted to initiate and manage heparin infusion while apixaban is on hold pending surgery evaluation. Currently no need for surgical intervention.   (Look at earlier noted for previous values) 2/1  0605 HL 0.45 2/2 0221 HL >1.10 STAT RE-COLLECT 2/2 0541 HL 0.39 2/3 0550 HL 0.63 2/4 0523 HL 0.75, Supratherapeutic  2/4 1452 HL 0.77, Supratherapeutic @ 1850 units/hr 2/4 2154 HL 0.84, Supratherapeutic @ 1750 units/hr 2/5 0450 HL 0.54, Therapeutic  2/5 1120 HL 0.59, Therapeutic x 2  Goal of Therapy:  Heparin level 0.3-0.7 units/ml Monitor platelets by anticoagulation protocol: Yes  Plan: - Heparin level therapeutic x 2. Will continue heparin infusion at 1550 unit/hr and repeat HL with AM labs - CBC  with AM labs.   Marcelus Dubberly Rodriguez-Guzman PharmD, BCPS 10/03/2022 12:25 PM

## 2022-10-03 NOTE — TOC Progression Note (Signed)
Transition of Care [facility]) - Progression Note    Patient Details  Name: Molly Brown MRN: [id number] Date of Birth: 06/12/1951  [facility] CM/SW Contact  Laurena Slimmer, RN Phone Number: 10/03/2022, 2:44 PM  Clinical Narrative:    Case reviewed for needs and disposition.    Expected Discharge Plan: [place] Barriers to Discharge: Continued Medical Work up  Expected Discharge Plan and Services       Living arrangements for the past 2 months: Single Family Home                                       Social Determinants of Health (SDOH) Interventions SDOH Screenings   Food Insecurity: No Food Insecurity (09/16/2022)  Housing: Low Risk  (09/16/2022)  Transportation Needs: No Transportation Needs (09/16/2022)  Utilities: Not At Risk (09/16/2022)  Tobacco Use: Medium Risk (09/26/2022)    Readmission Risk Interventions     No data to display

## 2022-10-03 NOTE — Progress Notes (Signed)
.. Progress Note   Patient: Molly Brown OHY:[id number] DOB: [birth year] DOA: 09/15/2022     18 DOS: the patient was seen and examined on 10/03/2022   Brief hospital course: 72 y.o. 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 Patient presents for evaluation of sudden onset abdominal pain mostly in the epigastrium, periumbilical area.  She rates her pain a 10 x 10 in intensity at its worst.  Pain is nonradiating and is associated with nausea and multiple episodes of emesis.  She states that she has had issues with abdominal pain in the past usually self-limiting but this is the worst pain she has had which prompted her visit to the emergency room.  She is unable to tell me if her prior episodes of pain related to meals.  She has urinary frequency and has dyspnea with exertion which is chronic.  Her last bowel movement was 2 days prior to this admission.  According to the patient she usually has daily bowel movements She denies having any fever, no chills, no chest pain, no headache, no dizziness, no lightheadedness, no cough, no blurred vision no focal deficit. Abnormal labs include lactic acid of 2.0, lipase 539, troponin 24, white count 15.8 CT scan of abdomen and pelvis shows acute interstitial pancreatitis. No evidence of pancreatic necrosis, pancreatic ductal dilation or walled-off collections. Calcifications of the mitral annulus with left atrial dilation, consider further evaluation with cardiac echo if not previously performed. Mild pulmonary edema. Aortic Atherosclerosis. Twelve-lead EKG reviewed by me shows rapid A-fib Patient received 2 L IV fluid bolus, Cardizem 20 mg IV push x 1 and is currently on a Cardizem drip.   1/19.   Tapered off Cardizem drip and on oral metoprolol and oral Cardizem CD.  Started empiric antibiotics for pancreatitis since procalcitonin elevated and leukocytosis. 1/20.  Consulted general surgery with ?gallstone pancreatitis.  Surgery ordered for a repeat CAT scan.  Holding Eliquis and started heparin drip just in case surgery needed. 1/21.  Patient pulled out her IV and wanted to go home.  Patient was reoriented and then agreeable to stay and to surgery if needed.  Advanced diet to full liquids. 1/22.  Bilirubin rising, MRCP showed acute interstitial edematous pancreatitis with peripancreatic fluid collections with some signs of hemorrhagic or proteinaceous change.  Some peripancreatic necrosis seen.  Pancreatic ascites.  Rather striking tapered narrowing of the mid common bile duct with biliary enhancement.  Dilated main pancreatic duct.  Findings may reflect pancreatic divisum or dominant dorsal drainage via minor papilla. 1/23.  Dobbhoff tube had to be advanced twice.  Last x-ray showing in the duodenal bulb.  Okay to start tube feedings.   1/24: further K replacement.  Mental status worse again today.  Tube feeds running and appears to be tolerating okay.  Stopped antibiotics to monitor. 1/25: pt sleeping a lot. Fever 100.4 F last night, recurrent leukocytosis also.  Resumed Zosyn.  Replaced phos.  Sodium declining slowly. Cardiology gave IV Lasix. 1/26: repeated CT, has more pancreatic fluid collections but no necrosis. New finding of acute diverticulitis with contained perforation. Surgery re-consulted.  Starting TPN and continuing antibiotics 1/27 >> 1/30:  On TPN, IV antibiotics.  Repeat CT slightly larger ?early abscess / fluid collection. Remains NPO. 1/31: surgery resuming Dobhoff trickle feeds. Leukocytosis  and LLQ tenderness improving.  2/2-2/4: pain gradually improving, diet gradually advancing. Continues on IV Abx for diverticulitis.  NG tube feeds continue to maintain nutritional  status.  2/5: would expect to advance diet and dc within a couple days  Assessment and Plan: Severe acute pancreatitis- improving Acute severe pancreatitis with increased fluid collections since prior CT but no evidence of necrosis on repeat CT (1/26).   On empiric Zosyn - continuing for diverticulitis. General surgery no plan for cholecystectomy IgG4 pending.   The patient does not drink alcohol.  No new medications.  Triglycerides normal.  MRCP shows the possibility of pancreatic divisum which could increase risk of pancreatitis.   Lipase normalized pretty quickly. GI consulted, have signed off. Tube feeds were held for acute diverticulitis with contained perf. Per surgery, patient continues to stay on tube feeds for now.  Tolerating liquid diet well and will advance her surgery recs. Will need close GI follow up for ongoing care of pancreatitis and repeat imaging.  Acute diverticulitis Repeated CT 1/26 to re-assess pancreatitis and patient now has sigmoid diverticulitis with contained perforation. no peritoneal signs. No tenderness to abdominal exam --General surgery following -- on Dobhoff tube feeds per surgery --Continue Zosyn --Pain control, antiemetics PRN --Monitor abdominal exam closely --May require IR for abscess drainage but at this point, likely too small to target.  Surgery to discuss with IR.  Acute metabolic encephalopathy Resolved.  Had some delirium when more ill and getting IV pain meds. --Delirium precautions --Minimize sedating meds  Atrial fibrillation with RVR (HCC) Chronic in nature.  Continue metoprolol and Cardizem CD.   Currently on heparin drip, continue for CVA prevention. Holding apixaban till patient is able to completely resume p.o. diet. And not at risk of needing surgery  Situational anxiety Pt reports significant anxiety and family note she has physical shaking at times when anxious.  She reports some baseline anxiety, but severely exacerbated  by  her current situation. --Low dose IV Valium PRN for anxiety.  Resolved, post repletion, hypophosphatemia Last Phos 3.7 Monitor and further replace PRN.  Hyponatremia, improved- Na+ 130>>132 Monitor BMP and volume status.  Resolved hypokalemia Monitor BMP  Obesity (BMI 30-39.9) Body mass index is 33.45 kg/m. Complicates overall care and prognosis.  Recommend lifestyle modifications including physical activity and diet for weight loss and overall long-term health.  HOCM (hypertrophic obstructive cardiomyopathy) ([place]) Patient with a history of HOCM Continue metoprolol.  Hypothyroidism Continue Synthroid  GERD (gastroesophageal reflux disease) Continue PPI     Physical Exam: Vitals:   10/02/22 1748 10/02/22 2223 10/03/22 0310 10/03/22 0746  BP: 125/83 113/78 102/70 118/67  Pulse: 81 89 81 81  Resp: 20 17 19 18   Temp: 98 F (36.7 C) 98.2 F (36.8 C) (!) 97.5 F (36.4 C) 97.9 F (36.6 C)  TempSrc:  Oral Oral Oral  SpO2: 97% 99% 100% 94%  Weight:    101.6 kg  Height:       General exam: awake, alert, no acute distress, obese HEENT: Dobhoff NG tube in place. moist mucus membranes, hearing grossly normal  Respiratory system: on room air, normal respiratory effort. Lungs clear Cardiovascular system: Regular rate and rhythm no rubs or gallops.   Gastrointestinal system: Abdomen is soft with bowel sounds present.  Non-tender to palpation Central nervous system: A&Ox4, no gross focal neurologic deficits, normal speech Extremities: moves all, mild edema, normal tone Skin: dry, intact, normal temperature Psychiatry: normal mood, congruent affect, judgement and insight appear normal   Data  Reviewed:    Latest Ref Rng & Units 10/03/2022    4:50 AM 10/01/2022    1:59 PM 09/29/2022    6:53 AM  BMP  Glucose 70 - 99 mg/dL 135  149  244   BUN 8 - 23 mg/dL 17  19  21    Creatinine 0.44 - 1.00 mg/dL 0.92  0.85  0.78   Sodium 135 - 145 mmol/L 132  130  131   Potassium 3.5 -  5.1 mmol/L 4.6  4.6  4.5   Chloride 98 - 111 mmol/L 98  97  100   CO2 22 - 32 mmol/L 25  24  25    Calcium 8.9 - 10.3 mg/dL 9.2  9.0  8.9   CBC    Component Value Date/Time   WBC 9.0 10/03/2022 0450   RBC 3.84 (L) 10/03/2022 0450   HGB 11.3 (L) 10/03/2022 0450   HCT 34.5 (L) 10/03/2022 0450   PLT 410 (H) 10/03/2022 0450   MCV 89.8 10/03/2022 0450   MCH 29.4 10/03/2022 0450   MCHC 32.8 10/03/2022 0450   RDW 15.6 (H) 10/03/2022 0450   RDW 14.2 02/21/2014 0750   LYMPHSABS 1.2 09/15/2022 0606   LYMPHSABS 1.6 02/21/2014 0750   MONOABS 0.6 09/15/2022 0606   EOSABS 0.0 09/15/2022 0606   EOSABS 0.2 02/21/2014 0750   BASOSABS 0.0 09/15/2022 0606   BASOSABS 0.0 02/21/2014 0750   Repeat CT abdomen/pelvis with IV contrast 1/29 --- persistent diverticulitis, slight enlargement of sigmoid fluid collection, suspected to be developing abscess, no extraluminal air.  Persistent pancreatitis changes with more confluent fluid collection anteriorly and inferiorly.  Repeat CT abdomen/pelvis with IV contrast 1/26  AM --- new sigmoid diverticulitis and more pancreatic fluid collections, but none are rim enhancing, no evidence of necrosis.  Family Communication: sister at bedside  Disposition: Status is: Inpatient Remains inpatient appropriate because: enteral feeds, IV antibiotics. Likely to progress to dc in a couple days   Planned Discharge Destination: Home with Home Health    Time spent: 36 minutes  Author: Richarda Osmond, MD 10/03/2022 8:34 AM  For on call review [email or web address].

## 2022-10-04 DIAGNOSIS — K5792 Diverticulitis of intestine, part unspecified, without perforation or abscess without bleeding: Secondary | ICD-10-CM | POA: Diagnosis not present

## 2022-10-04 DIAGNOSIS — I4891 Unspecified atrial fibrillation: Secondary | ICD-10-CM | POA: Diagnosis not present

## 2022-10-04 DIAGNOSIS — K859 Acute pancreatitis without necrosis or infection, unspecified: Secondary | ICD-10-CM | POA: Diagnosis not present

## 2022-10-04 LAB — GLUCOSE, CAPILLARY
Glucose-Capillary: 143 mg/dL — ABNORMAL HIGH (ref 70–99)
Glucose-Capillary: 136 mg/dL — ABNORMAL HIGH (ref 70–99)
Glucose-Capillary: 148 mg/dL — ABNORMAL HIGH (ref 70–99)
Glucose-Capillary: 138 mg/dL — ABNORMAL HIGH (ref 70–99)
Glucose-Capillary: 121 mg/dL — ABNORMAL HIGH (ref 70–99)
Glucose-Capillary: 136 mg/dL — ABNORMAL HIGH (ref 70–99)

## 2022-10-04 LAB — BASIC METABOLIC PANEL
Anion gap: 9 (ref 5–15)
BUN: 19 mg/dL (ref 8–23)
CO2: 27 mmol/L (ref 22–32)
Calcium: 9 mg/dL (ref 8.9–10.3)
Chloride: 96 mmol/L — ABNORMAL LOW (ref 98–111)
Creatinine, Ser: 0.92 mg/dL (ref 0.44–1.00)
GFR, Estimated: >60 mL/min (ref >60)
Glucose, Bld: 140 mg/dL — ABNORMAL HIGH (ref 70–99)
Potassium: 4.3 mmol/L (ref 3.5–5.1)
Sodium: 132 mmol/L — ABNORMAL LOW (ref 135–145)

## 2022-10-04 LAB — CBC
HCT: 33.7 % — ABNORMAL LOW (ref 36.0–46.0)
Hemoglobin: 10.8 g/dL — ABNORMAL LOW (ref 12.0–15.0)
MCH: 28.6 pg (ref 26.0–34.0)
MCHC: 32 g/dL (ref 30.0–36.0)
MCV: 89.4 fL (ref 80.0–100.0)
Platelets: 382 10*3/uL (ref 150–400)
RBC: 3.77 MIL/uL — ABNORMAL LOW (ref 3.87–5.11)
RDW: 15.6 % — ABNORMAL HIGH (ref 11.5–15.5)
WBC: 8 10*3/uL (ref 4.0–10.5)
nRBC: 0 % (ref 0.0–0.2)

## 2022-10-04 LAB — HEPARIN LEVEL (UNFRACTIONATED): Heparin Unfractionated: 0.46 IU/mL (ref 0.30–0.70)

## 2022-10-04 MED ORDER — APIXABAN 5 MG PO TABS
5.0000 mg | ORAL_TABLET | Freq: Two times a day (BID) | ORAL | Status: DC
  Administered 2022-10-04 – 2022-10-05 (×2): 5 mg via ORAL
  Filled 2022-10-04 (×2): qty 1

## 2022-10-04 MED ORDER — ENSURE ENLIVE PO LIQD
237.0000 mL | Freq: Two times a day (BID) | ORAL | Status: DC
  Administered 2022-10-05: 237 mL via ORAL

## 2022-10-04 MED ORDER — VITAL 1.5 CAL PO LIQD
660.0000 mL | ORAL | Status: DC
  Administered 2022-10-04: 660 mL

## 2022-10-04 MED ORDER — ADULT MULTIVITAMIN W/MINERALS CH
1.0000 | ORAL_TABLET | Freq: Every day | ORAL | Status: DC
  Administered 2022-10-05: 1 via ORAL
  Filled 2022-10-04: qty 1

## 2022-10-04 NOTE — Progress Notes (Signed)
Nutrition Follow-up  DOCUMENTATION CODES:   Obesity unspecified  INTERVENTION:   -Ensure Enlive po BID, each supplement provides 350 kcal and 20 grams of protein -MVI with minerals daily -TF via post-pyloric NG (transition to nocturnal feedings):   Vital 1.5 @ 55 ml/hr over 12 hour period   30 ml Prosource TF daily.     30 ml free water flush every 4 hours   Tube feeding regimen provides 1040 kcals, 65 grams of protein, and 504 ml of H2O. Total free water: 684 ml daily  NUTRITION DIAGNOSIS:   Increased nutrient needs related to acute illness (pancreatitis) as evidenced by estimated needs.  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  1/23- post pyloric feeding tube placed (tip past duodenal bulb per x-ray)    1/26- TF held secondary to findings of diverticulitis with perforation, TPN initiated 1/31- trickle TF re-started 2/1- advanced to clear liquid diet 2/2- advanced to full liquid diet 2/5- advanced to soft diet  Reviewed I/O's: -660 ml x 24 hours and +13.9 L since 09/20/22  UOP: 1.5 L x 24 hours  Pt receiving nursing care at time of visit.    Pt just advanced to soft diet this AM. Noted previous meal completions 25-50%.   Per general surgery notes, plan to wean TF today. RD will transition to nocturnal feedings in attempt to stimulate appetite. Potential to d/c NGT tomorrow. Pt is approaching discharge home (possibly in 24-48 hours).   Medications reviewed and include vitamin B-12, cardizem, and florastor.   Labs reviewed:  Na: 132, CBGS: 136-148 (inpatient orders for glycemic control are 0-15 units insulin aspart every 4 hours and 3 units insulin aspart every 4 hours).    Diet Order:   Diet Order             DIET SOFT Room service appropriate? Yes; Fluid consistency: Thin  Diet effective now                   EDUCATION NEEDS:   Education needs have been addressed  Skin:  Skin Assessment: Reviewed RN Assessment  Last BM:  10/04/22 (type 6)  Height:   Ht Readings from Last 1 Encounters:  09/15/22 5\' 8"  (1.727 m)    Weight:   Wt Readings from Last 1 Encounters:  10/04/22 102.8 kg    Ideal Body Weight:  63.6 kg  BMI:  Body mass index is 34.45 kg/m.  Estimated Nutritional Needs:   Kcal:  2050-2250  Protein:  105-120 grams  Fluid:  > 2 L    Loistine Chance, RD, LDN, [place] Registered Dietitian II Certified Diabetes Care and Education Specialist Please refer to [facility] for RD and/or RD on-call/weekend/after hours pager

## 2022-10-04 NOTE — Plan of Care (Signed)

## 2022-10-04 NOTE — Progress Notes (Signed)
.. Progress Note   Patient: Molly Brown IZT:[id number] DOB: 09-05-1950 DOA: 09/15/2022     19 DOS: the patient was seen and examined on 10/04/2022   Brief hospital course: 72 y.o. 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 Patient presents for evaluation of sudden onset abdominal pain mostly in the epigastrium, periumbilical area.  She rates her pain a 10 x 10 in intensity at its worst.  Pain is nonradiating and is associated with nausea and multiple episodes of emesis.  She states that she has had issues with abdominal pain in the past usually self-limiting but this is the worst pain she has had which prompted her visit to the emergency room.  She is unable to tell me if her prior episodes of pain related to meals.  She has urinary frequency and has dyspnea with exertion which is chronic.  Her last bowel movement was 2 days prior to this admission.  According to the patient she usually has daily bowel movements She denies having any fever, no chills, no chest pain, no headache, no dizziness, no lightheadedness, no cough, no blurred vision no focal deficit. Abnormal labs include lactic acid of 2.0, lipase 539, troponin 24, white count 15.8 CT scan of abdomen and pelvis shows acute interstitial pancreatitis. No evidence of pancreatic necrosis, pancreatic ductal dilation or walled-off collections. Calcifications of the mitral annulus with left atrial dilation, consider further evaluation with cardiac echo if not previously performed. Mild pulmonary edema. Aortic Atherosclerosis. Twelve-lead EKG reviewed by me shows rapid A-fib Patient received 2 L IV fluid bolus, Cardizem 20 mg IV push x 1 and is currently on a Cardizem drip.   1/19.   Tapered off Cardizem drip and on oral metoprolol and oral Cardizem CD.  Started empiric antibiotics for pancreatitis since procalcitonin elevated and leukocytosis. 1/20.  Consulted general surgery with ?gallstone pancreatitis.  Surgery ordered for a repeat CAT scan.  Holding Eliquis and started heparin drip just in case surgery needed. 1/21.  Patient pulled out her IV and wanted to go home.  Patient was reoriented and then agreeable to stay and to surgery if needed.  Advanced diet to full liquids. 1/22.  Bilirubin rising, MRCP showed acute interstitial edematous pancreatitis with peripancreatic fluid collections with some signs of hemorrhagic or proteinaceous change.  Some peripancreatic necrosis seen.  Pancreatic ascites.  Rather striking tapered narrowing of the mid common bile duct with biliary enhancement.  Dilated main pancreatic duct.  Findings may reflect pancreatic divisum or dominant dorsal drainage via minor papilla. 1/23.  Dobbhoff tube had to be advanced twice.  Last x-ray showing in the duodenal bulb.  Okay to start tube feedings.   1/24: further K replacement.  Mental status worse again today.  Tube feeds running and appears to be tolerating okay.  Stopped antibiotics to monitor. 1/25: pt sleeping a lot. Fever 100.4 F last night, recurrent leukocytosis also.  Resumed Zosyn.  Replaced phos.  Sodium declining slowly. Cardiology gave IV Lasix. 1/26: repeated CT, has more pancreatic fluid collections but no necrosis. New finding of acute diverticulitis with contained perforation. Surgery re-consulted.  Starting TPN and continuing antibiotics 1/27 >> 1/30:  On TPN, IV antibiotics.  Repeat CT slightly larger ?early abscess / fluid collection. Remains NPO. 1/31: surgery resuming Dobhoff trickle feeds. Leukocytosis  and LLQ tenderness improving.  2/2-2/4: pain gradually improving, diet gradually advancing. Continues on IV Abx for diverticulitis.  NG tube feeds continue to maintain nutritional  status.  2/5: would expect to advance diet and dc within a couple days 2/6: patient is having mild discomfort and nausea with PO intake still. Resolved with analgesic and antiemetics. Surgery recommends NG tube until tomorrow. If she remains stable, can likely be removed and discharged home.   Assessment and Plan: Severe acute pancreatitis- improving Acute severe pancreatitis with increased fluid collections since prior CT but no evidence of necrosis on repeat CT (1/26).   On empiric Zosyn - continuing for diverticulitis. General surgery no plan for cholecystectomy The patient does not drink alcohol.  No new medications.  Triglycerides normal.  MRCP shows the possibility of pancreatic divisum which could increase risk of pancreatitis.   Lipase normalized pretty quickly. GI consulted, have signed off. Tube feeds were held for acute diverticulitis with contained perf. Per surgery, patient continues to stay on tube feeds for now.  Tolerating diet moderately well and will advance per surgery recs. Will need close GI follow up for ongoing care of pancreatitis and repeat imaging.  Acute diverticulitis Repeated CT 1/26 to re-assess pancreatitis and patient now has sigmoid diverticulitis with contained perforation. no peritoneal signs. No tenderness to abdominal exam --General surgery following -- on Dobhoff tube feeds per surgery --Completed Zosyn --Pain control, antiemetics PRN --Monitor abdominal exam closely  Acute metabolic encephalopathy Resolved.  Had some delirium when more ill and getting IV pain meds. --Delirium precautions --Minimize sedating meds  Atrial fibrillation with RVR (HCC) Chronic in nature.  Continue metoprolol and Cardizem CD.   Currently on heparin drip- will transition to eliquis as she is not likely to have need for another procedure.   Situational anxiety Pt reports significant anxiety and family note she has physical shaking at times when anxious.  She reports  some baseline anxiety, but severely exacerbated by  her current situation. --Low dose IV Valium PRN for anxiety.  Resolved, post repletion, hypophosphatemia Last Phos 3.7 Monitor and further replace PRN.  Hyponatremia, improved- Na+ 130>>132 Monitor BMP and volume status.  Resolved hypokalemia Monitor BMP  Obesity (BMI 30-39.9) Body mass index is 33.45 kg/m. Complicates overall care and prognosis.  Recommend lifestyle modifications including physical activity and diet for weight loss and overall long-term health.  HOCM (hypertrophic obstructive cardiomyopathy) ([place]) Patient with a history of HOCM Continue metoprolol.  Hypothyroidism Continue Synthroid  GERD (gastroesophageal reflux disease) Continue PPI     Physical Exam: Vitals:   10/04/22 0032 10/04/22 0110 10/04/22 0120 10/04/22 0529  BP: 97/73 106/70  110/66  Pulse: 70 76  82  Resp: 18 (!) 28 20 (!) 24  Temp: 97.7 F (36.5 C) (!) 97.4 F (36.3 C)  97.9 F (36.6 C)  TempSrc: Oral Oral  Oral  SpO2: 95% 97%  98%  Weight:      Height:       General exam: awake, alert, no acute distress, obese HEENT: Dobhoff NG tube in place. moist mucus membranes, hearing grossly normal  Respiratory system: on room air, normal respiratory effort. Lungs clear Cardiovascular system: Regular rate and rhythm no rubs or gallops.   Gastrointestinal system: Abdomen is soft with bowel sounds present.  Non-tender to palpation Central nervous system: A&Ox4, no gross focal neurologic deficits, normal speech Extremities: moves all, mild edema, normal tone Skin: dry, intact, normal temperature Psychiatry: normal mood, congruent affect, judgement and insight appear normal  Data Reviewed:    Latest Ref Rng & Units 10/04/2022    6:34 AM 10/03/2022    4:50 AM 10/01/2022    1:59 PM  BMP  Glucose 70 - 99 mg/dL 140  135  149   BUN 8 - 23 mg/dL 19  17  19    Creatinine 0.44 - 1.00 mg/dL 0.92  0.92  0.85   Sodium 135 - 145 mmol/L 132  132  130    Potassium 3.5 - 5.1 mmol/L 4.3  4.6  4.6   Chloride 98 - 111 mmol/L 96  98  97   CO2 22 - 32 mmol/L 27  25  24    Calcium 8.9 - 10.3 mg/dL 9.0  9.2  9.0   CBC    Component Value Date/Time   WBC 8.0 10/04/2022 0634   RBC 3.77 (L) 10/04/2022 0634   HGB 10.8 (L) 10/04/2022 0634   HCT 33.7 (L) 10/04/2022 0634   PLT 382 10/04/2022 0634   MCV 89.4 10/04/2022 0634   MCH 28.6 10/04/2022 0634   MCHC 32.0 10/04/2022 0634   RDW 15.6 (H) 10/04/2022 0634   RDW 14.2 02/21/2014 0750   LYMPHSABS 1.2 09/15/2022 0606   LYMPHSABS 1.6 02/21/2014 0750   MONOABS 0.6 09/15/2022 0606   EOSABS 0.0 09/15/2022 0606   EOSABS 0.2 02/21/2014 0750   BASOSABS 0.0 09/15/2022 0606   BASOSABS 0.0 02/21/2014 0750   Repeat CT abdomen/pelvis with IV contrast 1/29 --- persistent diverticulitis, slight enlargement of sigmoid fluid collection, suspected to be developing abscess, no extraluminal air.  Persistent pancreatitis changes with more confluent fluid collection anteriorly and inferiorly.  Repeat CT abdomen/pelvis with IV contrast 1/26  AM --- new sigmoid diverticulitis and more pancreatic fluid collections, but none are rim enhancing, no evidence of necrosis.  Family Communication: sister at bedside  Disposition: Status is: Inpatient Remains inpatient appropriate because: enteral feeds, IV antibiotics. Likely to progress to dc in a couple days   Planned Discharge Destination: Home with Home Health    Time spent: 36 minutes  Author: Richarda Osmond, MD 10/04/2022 8:16 AM  For on call review [email or web address].

## 2022-10-04 NOTE — Progress Notes (Addendum)
[facility] SURGICAL PROGRESS NOTE (cpt [id number])  Hospital Day(s): 19.   Interval History: Patient seen and examined, no acute events or new complaints overnight. Patient reports she is doing well; objectively looks the best I have seen. Sitting up in bed, no complaints. She denies fever, chills, nausea, emesis. She remains without a leukocytosis; 8.0K. Renal function is normal; sCr - 0.92; UO - 1500 ccs. No electrolyte derangements. She is on soft diet. Also with TF at 55 ml/hr. Having bowel function.   Review of Systems:  Constitutional: denies fever, chills  HEENT: denies cough or congestion  Respiratory: denies any shortness of breath  Cardiovascular: denies chest pain or palpitations  Gastrointestinal: No abdominal pain, denied N/V Genitourinary: denies burning with urination or urinary frequency Musculoskeletal: denies pain, decreased motor or sensation  Vital signs in last 24 hours: [min-max] current  Temp:  [97.4 F (36.3 C)-98.5 F (36.9 C)] 97.9 F (36.6 C) (02/06 0529) Pulse Rate:  [70-90] 82 (02/06 0529) Resp:  [18-28] 24 (02/06 0529) BP: (97-120)/(66-80) 110/66 (02/06 0529) SpO2:  [94 %-98 %] 98 % (02/06 0529) Weight:  [101.6 kg] 101.6 kg (02/05 0746)     Height: 5\' 8"  (172.7 cm) Weight: 101.6 kg BMI (Calculated): 34.07   Intake/Output last 2 shifts:  02/05 0701 - 02/06 0700 In: 840 [P.O.:840] Out: 1500 [Urine:1500]   Physical Exam:  Constitutional: alert, cooperative and no distress  HENT: normocephalic without obvious abnormality, Dobbhoff in place Eyes: PERRL, EOM's grossly intact and symmetric  Respiratory: breathing non-labored at rest  Cardiovascular: regular rate and sinus rhythm  Gastrointestinal: soft, without pain this morning, non-distended, no rebound/guarding. She is not overtly peritonitic nor toxic  Musculoskeletal: no edema or wounds, motor and sensation grossly intact, NT    Labs:     Latest Ref Rng & Units 10/04/2022    6:34 AM  10/03/2022    4:50 AM 10/02/2022    5:23 AM  CBC  WBC 4.0 - 10.5 K/uL 8.0  9.0  9.0   Hemoglobin 12.0 - 15.0 g/dL 10.8  11.3  12.0   Hematocrit 36.0 - 46.0 % 33.7  34.5  36.8   Platelets 150 - 400 K/uL 382  410  438       Latest Ref Rng & Units 10/04/2022    6:34 AM 10/03/2022    4:50 AM 10/01/2022    1:59 PM  CMP  Glucose 70 - 99 mg/dL 140  135  149   BUN 8 - 23 mg/dL 19  17  19    Creatinine 0.44 - 1.00 mg/dL 0.92  0.92  0.85   Sodium 135 - 145 mmol/L 132  132  130   Potassium 3.5 - 5.1 mmol/L 4.3  4.6  4.6   Chloride 98 - 111 mmol/L 96  98  97   CO2 22 - 32 mmol/L 27  25  24    Calcium 8.9 - 10.3 mg/dL 9.0  9.2  9.0   Total Protein 6.5 - 8.1 g/dL  6.8    Total Bilirubin 0.3 - 1.2 mg/dL  0.6    Alkaline Phos 38 - 126 U/L  77    AST 15 - 41 U/L  24    ALT 0 - 44 U/L  27      Imaging studies: No new pertinent imaging studies   Assessment/Plan: (ICD-10's: K30.32) 72 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               -  Continue soft diet as tolerated  - Given diet advancement, I think we can now wean from tube feedings and potentially DC Dobbhoff tomorrow if she is doing well.  - Continue IV Abx (Zosyn); Day 11/14 for perforated diverticulitis  - No need for emergent surgical intervention currently. She understands that should she fail to improve or clinically deteriorate, we may need to consider intervention which would likely result in temporizing colostomy.   - No role for repeat imaging at the moment; will consider if any signs of deterioration              - Monitor abdominal examination; on-going bowel function - Monitor leukocytosis; remains resolved (02/02) - Pain control prn; antiemetics prn - Okay to mobilize as tolerated/feasible             - Further management per primary service; we will follow   - Discharge Planning: Patient appears the best I've seen this admission, no longer with  abdominal pain, diet advanced without issues. Will wean from TF today and potentially DC Dobbhoff tomorrow (02/07). She will likely be ready for discharge in 24-48 hours. She can follow up in the office in 1 month. We will be available, please call with questions   All of the above findings and recommendations were discussed with the patient, patient's family, and the medical team, and all of patient's and family's questions were answered to their expressed satisfaction.  -- Edison Simon, PA-C [facility] 10/04/2022, 7:44 AM M-F: 7am - 4pm

## 2022-10-04 NOTE — Plan of Care (Signed)
  Problem: Health Behavior/Discharge Planning: Goal: Ability to manage health-related needs will improve Outcome: Progressing   Problem: Clinical Measurements: Goal: Ability to maintain clinical measurements within normal limits will improve Outcome: Progressing Goal: Will remain free from infection Outcome: Progressing Goal: Diagnostic test results will improve Outcome: Progressing   

## 2022-10-04 NOTE — Progress Notes (Signed)
[place] for IV Heparin Indication: atrial fibrillation  Patient Measurements: Height: 5\' 8"  (172.7 cm) Weight: 101.6 kg (224 lb) IBW/kg (Calculated) : 63.9 Heparin Dosing Weight: 85.8 kg  Labs: Recent Labs     0000 10/01/22 1359 10/02/22 0523 10/02/22 1452 10/03/22 0450 10/03/22 1120 10/04/22 0634  HGB   < >  --  12.0  --  11.3*  --  10.8*  HCT  --   --  36.8  --  34.5*  --  33.7*  PLT  --   --  438*  --  410*  --  382  HEPARINUNFRC  --   --  0.75*   < > 0.54 0.59 0.46  CREATININE  --  0.85  --   --  0.92  --  0.92   < > = values in this interval not displayed.    Estimated Creatinine Clearance: 72 mL/min (by C-G formula based on SCr of 0.92 mg/dL).  Medical History: Past Medical History:  Diagnosis Date   AF (atrial fibrillation) (HCC)    Anal fissure    Anxiety    Cardiac arrhythmia due to congenital heart disease    Chicken pox    Depression    GERD (gastroesophageal reflux disease)    Heart murmur     Medications:  Apixaban 5 mg BID (last dose 09/16/22 at 2251)  Assessment: 72 y/o F with medical history including Afib on apixaban who is admitted with acute pancreatitis. General surgery consulted. Pharmacy consulted to initiate and manage heparin infusion while apixaban is on hold pending surgery evaluation. Currently no need for surgical intervention.   (Look at earlier noted for previous values) 2/1  0605 HL 0.45 2/2 0221 HL >1.10 STAT RE-COLLECT 2/2 0541 HL 0.39 2/3 0550 HL 0.63 2/4 0523 HL 0.75, Supratherapeutic  2/4 1452 HL 0.77, Supratherapeutic @ 1850 units/hr 2/4 2154 HL 0.84, Supratherapeutic @ 1750 units/hr 2/5 0450 HL 0.54, Therapeutic  2/5 1120 HL 0.59, Therapeutic x 2 2/6 0634 HL 0.46, therapeutic  Goal of Therapy:  Heparin level 0.3-0.7 units/ml Monitor platelets by anticoagulation protocol: Yes  Plan: - Continue heparin infusion at 1550 unit/hr and repeat HL with AM labs - Plan to discuss  change from heparin infusion to daily enoxaparin 1.5mg /kg with provider. - CBC with AM labs.   Maxwel Meadowcroft Rodriguez-Guzman PharmD, BCPS 10/04/2022 7:56 AM

## 2022-10-05 DIAGNOSIS — K859 Acute pancreatitis without necrosis or infection, unspecified: Secondary | ICD-10-CM | POA: Diagnosis not present

## 2022-10-05 DIAGNOSIS — I4891 Unspecified atrial fibrillation: Secondary | ICD-10-CM | POA: Diagnosis not present

## 2022-10-05 DIAGNOSIS — K5792 Diverticulitis of intestine, part unspecified, without perforation or abscess without bleeding: Secondary | ICD-10-CM | POA: Diagnosis not present

## 2022-10-05 LAB — CBC
HCT: 35.4 % — ABNORMAL LOW (ref 36.0–46.0)
Hemoglobin: 11.4 g/dL — ABNORMAL LOW (ref 12.0–15.0)
MCH: 28.8 pg (ref 26.0–34.0)
MCHC: 32.2 g/dL (ref 30.0–36.0)
MCV: 89.4 fL (ref 80.0–100.0)
Platelets: 377 10*3/uL (ref 150–400)
RBC: 3.96 MIL/uL (ref 3.87–5.11)
RDW: 15.4 % (ref 11.5–15.5)
WBC: 6.5 10*3/uL (ref 4.0–10.5)
nRBC: 0 % (ref 0.0–0.2)

## 2022-10-05 LAB — COMPREHENSIVE METABOLIC PANEL
ALT: 27 U/L (ref 0–44)
AST: 27 U/L (ref 15–41)
Albumin: 2.9 g/dL — ABNORMAL LOW (ref 3.5–5.0)
Alkaline Phosphatase: 83 U/L (ref 38–126)
Anion gap: 8 (ref 5–15)
BUN: 19 mg/dL (ref 8–23)
CO2: 27 mmol/L (ref 22–32)
Calcium: 8.9 mg/dL (ref 8.9–10.3)
Chloride: 98 mmol/L (ref 98–111)
Creatinine, Ser: 0.86 mg/dL (ref 0.44–1.00)
GFR, Estimated: >60 mL/min (ref >60)
Glucose, Bld: 124 mg/dL — ABNORMAL HIGH (ref 70–99)
Potassium: 4.5 mmol/L (ref 3.5–5.1)
Sodium: 133 mmol/L — ABNORMAL LOW (ref 135–145)
Total Bilirubin: 0.9 mg/dL (ref 0.3–1.2)
Total Protein: 7.1 g/dL (ref 6.5–8.1)

## 2022-10-05 LAB — GLUCOSE, CAPILLARY
Glucose-Capillary: 123 mg/dL — ABNORMAL HIGH (ref 70–99)
Glucose-Capillary: 123 mg/dL — ABNORMAL HIGH (ref 70–99)
Glucose-Capillary: 132 mg/dL — ABNORMAL HIGH (ref 70–99)
Glucose-Capillary: 198 mg/dL — ABNORMAL HIGH (ref 70–99)

## 2022-10-05 MED ORDER — SACCHAROMYCES BOULARDII 250 MG PO CAPS: 250.0000 mg | ORAL_CAPSULE | Freq: Two times a day (BID) | ORAL | 0 refills | Status: AC

## 2022-10-05 MED ORDER — OXYCODONE HCL 5 MG PO TABS: 5.0000 mg | ORAL_TABLET | ORAL | 0 refills | Status: AC | PRN

## 2022-10-05 MED ORDER — DILTIAZEM HCL ER COATED BEADS 180 MG PO CP24: 180.0000 mg | ORAL_CAPSULE | Freq: Every day | ORAL | 0 refills | Status: DC

## 2022-10-05 NOTE — Discharge Summary (Addendum)
Physician Discharge Summary  Patient: Molly Brown [id number] DOB: 1951/05/03   Code Status: Full Code Admit date: 09/15/2022 Discharge date: 10/05/2022 Disposition: Home, No home health services recommended PCP: Patient, No Pcp Per  Recommendations for Outpatient Follow-up:  Follow up with PCP within 1-2 weeks Regarding general hospital follow up and preventative care Recommend metabolic panel Follow up with general surgery Regarding pancreatitis and diverticulitis Follow up with cardiology Regarding afib  Discharge Diagnoses:  Principal Problem:   Severe acute pancreatitis Active Problems:   Acute metabolic encephalopathy   Acute diverticulitis   Atrial fibrillation with RVR (HCC)   GERD (gastroesophageal reflux disease)   Hypothyroidism   HOCM (hypertrophic obstructive cardiomyopathy) (HCC)   Obesity (BMI 30-39.9)   Sterile pancreatic necrosis   Acute pancreatitis   Hypokalemia   Hyponatremia   Hypophosphatemia   Diverticulitis of colon   Situational anxiety   Diverticulitis  Brief Hospital Course Summary: 72 y.o. 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 Patient presents for evaluation of sudden onset abdominal pain mostly in the epigastrium, periumbilical area.  She rates her pain a 10 x 10 in intensity at its worst.  Pain is nonradiating and is associated with nausea and multiple episodes of emesis.  She states that she has had issues with abdominal pain in the past usually self-limiting but this is the worst pain she has had which prompted her visit to the emergency room.  She is unable to tell me if her prior episodes of pain related to meals.  She has urinary frequency and has dyspnea with  exertion which is chronic.  Her last bowel movement was 2 days prior to this admission.  According to the patient she usually has daily bowel movements She denies having any fever, no chills, no chest pain, no headache, no dizziness, no lightheadedness, no cough, no blurred vision no focal deficit. Abnormal labs include lactic acid of 2.0, lipase 539, troponin 24, white count 15.8 CT scan of abdomen and pelvis shows acute interstitial pancreatitis. No evidence of pancreatic necrosis, pancreatic ductal dilation or walled-off collections. Calcifications of the mitral annulus with left atrial dilation, consider further evaluation with cardiac echo if not previously performed. Mild pulmonary edema. Aortic Atherosclerosis. Twelve-lead EKG reviewed by me shows rapid A-fib Patient received 2 L IV fluid bolus, Cardizem 20 mg IV push x 1 and is currently on a Cardizem drip.   1/19.  Tapered off Cardizem drip and on oral metoprolol and oral Cardizem CD.  Started empiric antibiotics for pancreatitis since procalcitonin elevated and leukocytosis. 1/20.  Consulted general surgery with ?gallstone pancreatitis.  Surgery ordered for a repeat CAT scan.  Holding Eliquis and started heparin drip just in case surgery needed. 1/21.  Patient pulled out her IV and wanted to go home.  Patient was reoriented and then agreeable to stay and to surgery if needed.  Advanced diet to full liquids. 1/22.  Bilirubin rising, MRCP showed acute interstitial edematous pancreatitis with peripancreatic fluid collections with some signs of hemorrhagic or proteinaceous change.  Some peripancreatic necrosis seen.  Pancreatic ascites.  Rather striking tapered narrowing of the mid common bile duct with biliary enhancement.  Dilated main pancreatic duct.  Findings may reflect pancreatic divisum or dominant dorsal drainage via minor papilla. 1/23.  Dobbhoff  tube had to be advanced twice.  Last x-ray showing in the duodenal bulb.  Okay to start tube  feedings.   1/24: further K replacement.  Mental status worse again today.  Tube feeds running and appears to be tolerating okay.  Stopped antibiotics to monitor. 1/25: pt sleeping a lot. Fever 100.4 F last night, recurrent leukocytosis also.  Resumed Zosyn.  Replaced phos.  Sodium declining slowly. Cardiology gave IV Lasix. 1/26: repeated CT, has more pancreatic fluid collections but no necrosis. New finding of acute diverticulitis with contained perforation. Surgery re-consulted.  Starting TPN and continuing antibiotics 1/27 >> 1/30:  On TPN, IV antibiotics.  Repeat CT slightly larger ?early abscess / fluid collection. Remains NPO. 1/31: surgery resuming Dobhoff trickle feeds. Leukocytosis and LLQ tenderness improving.   2/2-2/4: pain gradually improving, diet gradually advancing. Continues on IV Abx for diverticulitis.  NG tube feeds continue to maintain nutritional status.  2/5: would expect to advance diet and dc within a couple days 2/6: patient is having mild discomfort and nausea with PO intake still. Resolved with analgesic and antiemetics. 2/7: able to tolerate PO meal with mild nausea and epigastric pain which is well controlled with medication and unchanged for several days. Surgery has cleared to remove NG tube and discharge for outpatient follow up. Her Afib has been well controlled without complications on new medication for several days so will be discharged with cardiology followup up outpatient.  She has completed antibiotic course for diverticulitis.   Discharge Condition: Good, improved Recommended discharge diet: Regular healthy diet  Consultations: General surgery  Cardiology   Procedures/Studies: NG tube placement TPN temporarily   Allergies as of 10/05/2022       Reactions   Eszopiclone Other (See Comments)   Felt really weird   Flecainide Other (See Comments), Rash   Insomnia and felt drugged        Medication List     STOP taking these medications     dexamethasone 6 MG tablet Commonly known as: DECADRON   Probiotic Daily Caps       TAKE these medications    acetaminophen 325 MG tablet Commonly known as: TYLENOL Take 650 mg by mouth every 6 (six) hours as needed.   apixaban 5 MG Tabs tablet Commonly known as: ELIQUIS Take 1 tablet (5 mg total) by mouth 2 (two) times daily.   cyanocobalamin 500 MCG tablet Commonly known as: VITAMIN B12 Take 500 mcg by mouth daily.   diltiazem 180 MG 24 hr capsule Commonly known as: CARDIZEM CD Take 1 capsule (180 mg total) by mouth daily. Start taking on: October 06, 2022   levothyroxine 25 MCG tablet Commonly known as: SYNTHROID Take 1 tablet (25 mcg total) by mouth daily at 6 (six) AM.   metoprolol tartrate 50 MG tablet Commonly known as: LOPRESSOR Take 1 tablet (50 mg total) by mouth 2 (two) times daily.   multivitamin Liqd Take 5 mLs by mouth daily.   omeprazole 40 MG capsule Commonly known as: PRILOSEC TAKE 1 CAPSULE (40 MG TOTAL) BY MOUTH DAILY.   oxyCODONE 5 MG immediate release tablet Commonly known as: Oxy IR/ROXICODONE Take 1 tablet (5 mg total) by mouth every 4 (four) hours as needed for up to 7 days for moderate pain.   polyethylene glycol 17 g packet Commonly known as: MIRALAX / GLYCOLAX Take 17 g by mouth as needed.   saccharomyces boulardii 250 MG capsule Commonly known as: FLORASTOR Take 1 capsule (250 mg total) by mouth 2 (  two) times daily.   tacrolimus 0.1 % ointment Commonly known as: PROTOPIC APPLY TO AFFECTED AREA(S) TWICE A DAY AS DIRECTED        Follow-up Information     Callwood, Karma Greaser D, MD. Go in 1 week(s).   Specialties: Cardiology, Internal Medicine Contact information: [place] [address] [phone]         Caroleen Hamman F, MD Follow up in 1 month(s).   Specialty: General Surgery Why: Hospital follow up; diverticulitis Contact information: [address] [place]  [address] [phone]                 Subjective   Pt reports feeling overall well. She denies nausea or abdominal pain currently. She is looking forward to getting the NG tube out.  She states that she continues to have mild nausea and epigastric pain post-prandial but she is very ready to go home. The symptoms are well controlled with zofran and analgesics.   All questions and concerns were addressed at time of discharge.  Objective  Blood pressure (!) 88/60, pulse 69, temperature 98.2 F (36.8 C), resp. rate 16, height 5' 8"$  (1.727 m), weight 102.6 kg, SpO2 100 %.   General: Pt is alert, awake, not in acute distress Cardiovascular: RRR, S1/S2 +, no rubs, no gallops Respiratory: CTA bilaterally, no wheezing, no rhonchi Abdominal: Soft, NT, ND, bowel sounds + Extremities: no edema, no cyanosis  The results of significant diagnostics from this hospitalization (including imaging, microbiology, ancillary and laboratory) are listed below for reference.   Imaging studies: CT ABDOMEN PELVIS W CONTRAST  Result Date: 09/26/2022 CLINICAL DATA:  Follow-up diverticulitis, known pancreatitis EXAM: CT ABDOMEN AND PELVIS WITH CONTRAST TECHNIQUE: Multidetector CT imaging of the abdomen and pelvis was performed using the standard protocol following bolus administration of intravenous contrast. RADIATION DOSE REDUCTION: This exam was performed according to the departmental dose-optimization program which includes automated exposure control, adjustment of the mA and/or kV according to patient size and/or use of iterative reconstruction technique. CONTRAST:  158m OMNIPAQUE IOHEXOL 300 MG/ML  SOLN COMPARISON:  09/23/2022 FINDINGS: Lower chest: No acute abnormality. Hepatobiliary: No focal liver abnormality is seen. No gallstones, gallbladder wall thickening, or biliary dilatation. Pancreas: Pancreas is again well visualized with significant peripancreatic edema. No enhancement abnormalities in the  pancreas are identified to suggest necrosis. The pre pancreatic fluid is somewhat more organized although no discrete pseudocyst is noted. Similar changes are noted adjacent to the uncinate process also stable from exam. The fluid extending inferiorly anterior to the aorta and IVC appears somewhat more organized although no definitive pseudocyst is seen. Spleen: Normal in size without focal abnormality. Adrenals/Urinary Tract: Adrenal glands are within normal limits. Kidneys are well visualized bilaterally. Small subcentimeter cysts are again noted bilaterally. No further follow-up is recommended. No obstructive changes are seen. The bladder is within normal limits. Stomach/Bowel: Diverticular changes again seen at the junction of the descending and sigmoid colons. Inflammatory changes are slightly decreased when compare with the prior exam. There is a persistent air-fluid collection adjacent to the colon best seen on image number 68 of series 3 consistent with small developing abscess. This measures approximately 3.3 x 2.3 cm. This is slightly increased in size when compared to the prior study. The more proximal colon shows no obstructive or inflammatory changes. The appendix is within normal limits. Small bowel and stomach are unremarkable. Feeding catheter is noted extending into the second portion of the duodenum. Vascular/Lymphatic: Aortic atherosclerosis.  Scattered peripancreatic lymph nodes are seen. Reproductive: Status post hysterectomy. No adnexal masses. Other: No abdominal wall hernia or abnormality. No abdominopelvic ascites. Musculoskeletal: No acute or significant osseous findings. IMPRESSION: Persistent changes of diverticulitis at the junction of the descending and sigmoid colons. Slight enlargement of the previously described air-fluid collection is noted consistent with developing abscess. No extraluminal air is seen. Persistent changes of pancreatitis with some more confluent fluid collections  both anteriorly and inferiorly to the pancreas. No true pseudocyst is noted at this time. No new focal abnormality is noted. Electronically Signed   By: Inez Catalina M.D.   On: 09/26/2022 17:42   DG Chest Portable 1 View  Result Date: 09/23/2022 CLINICAL DATA:  Provided history: PICC placement. EXAM: PORTABLE CHEST 1 VIEW COMPARISON:  Prior chest radiographs 09/15/2022. FINDINGS: Right-sided PICC with tip at the level of the upper right atrium. Central pulmonary vascular congestion without overt pulmonary edema. Minimal atelectasis at both lung bases. No evidence of pleural effusion or pneumothorax. No acute bony abnormality identified. IMPRESSION: 1. Right-sided PICC with tip at the level of the upper right atrium. 2. Central pulmonary vascular congestion without overt pulmonary edema. 3. Minimal atelectasis at both lung bases. Electronically Signed   By: Kellie Simmering D.O.   On: 09/23/2022 15:33   [place] EKG SITE RITE  Result Date: 09/23/2022 If Site Rite image not attached, placement could not be confirmed due to current cardiac rhythm.  CT ABDOMEN PELVIS W CONTRAST  Result Date: 09/23/2022 CLINICAL DATA:  Follow-up acute pancreatitis.  Persistent symptoms. EXAM: CT ABDOMEN AND PELVIS WITH CONTRAST TECHNIQUE: Multidetector CT imaging of the abdomen and pelvis was performed using the standard protocol following bolus administration of intravenous contrast. RADIATION DOSE REDUCTION: This exam was performed according to the departmental dose-optimization program which includes automated exposure control, adjustment of the mA and/or kV according to patient size and/or use of iterative reconstruction technique. CONTRAST:  168m OMNIPAQUE IOHEXOL 300 MG/ML  SOLN COMPARISON:  Abdomen MRI dated 09/19/2022. Abdomen and pelvis CT dated 09/17/2022. FINDINGS: Lower chest: Small bilateral pleural effusions, decreased on the right and increased on the left. Minimal bibasilar atelectasis. Again demonstrated is marked  left atrial enlargement. Hepatobiliary: No focal liver abnormality is seen. No gallstones, gallbladder wall thickening, or biliary dilatation. Pancreas: Increased peripancreatic edema and fluid including multiple developing fluid collections without surrounding rim enhancement at this time. The pancreatic parenchyma is unremarkable. Spleen: Normal in size without focal abnormality. Adrenals/Urinary Tract: Normal appearing adrenal glands. Small lower pole right renal cyst and tiny left renal cyst. These do not need imaging follow-up. Unremarkable ureters and urinary bladder. Stomach/Bowel: Interval development of extensive pericolonic soft tissue stranding at the level of multiple diverticula involving the proximal sigmoid colon. There are other diverticula elsewhere in the sigmoid and descending colon. Is also a contained perforation at this level containing fluid and gas laterally, measuring 3.1 x 1.6 cm in the coronal plane image number 41/6 2.4 cm in width on image number 56/4. Is also a tiny amount of adjacent extraluminal gas. Feeding tube extending through the stomach with its tip in the duodenal bulb. Unremarkable small bowel and appendix. Vascular/Lymphatic: Atheromatous arterial calcifications without aneurysm. No enlarged lymph nodes. Reproductive: Status post hysterectomy. No adnexal masses. Other: Small amount of free peritoneal fluid. Musculoskeletal: Mild lumbar and lower thoracic spine degenerative changes. IMPRESSION: 1. Interval changes of acute diverticulitis involving the proximal sigmoid colon with a contained perforation at this level containing fluid and gas laterally, measuring 3.1 x  1.6 x 2.4 cm. There is an adjacent tiny collection of extraluminal gas. 2. Increased peripancreatic edema and fluid including multiple developing fluid collections without surrounding rim enhancement at this time and no evidence of pancreatic necrosis. 3. Small bilateral pleural effusions, decreased on the right  and increased on the left. 4. Small amount of free peritoneal fluid, decreased. 5. Marked left atrial enlargement. These results will be called to the ordering clinician or representative by the Radiologist Assistant, and communication documented in the PACS or [employer]. Electronically Signed   By: Claudie Revering M.D.   On: 09/23/2022 10:37   DG Abd 1 View  Result Date: 09/20/2022 CLINICAL DATA:  Feeding tube placement EXAM: ABDOMEN - 1 VIEW COMPARISON:  09/20/2022 8:56 a.m. FINDINGS: The feeding tube tip projects over the duodenal bulb. Gas visualized in the transverse colon. Mild subsegmental atelectasis suspected at the lung bases. IMPRESSION: 1. Feeding tube tip projects over the duodenal bulb. Electronically Signed   By: Van Clines M.D.   On: 09/20/2022 12:07   DG Abd 1 View  Result Date: 09/20/2022 CLINICAL DATA:  Feeding tube placement EXAM: ABDOMEN - 1 VIEW COMPARISON:  09/19/2022 FINDINGS: Feeding tube within the proximal gastric body. No change from prior. IMPRESSION: No change in feeding tube position.  Stylet removed Electronically Signed   By: Suzy Bouchard M.D.   On: 09/20/2022 09:28   DG Abd 1 View  Result Date: 09/20/2022 CLINICAL DATA:  [id number].  Check feeding tube positioning. EXAM: ABDOMEN - 1 VIEW COMPARISON:  KUB 09/15/2022, CT abdomen and pelvis 09/17/2022 FINDINGS: The bowel gas pattern is nonobstructive. No radio-opaque calculi or other significant radiographic abnormality are seen. Feeding tube extends to the left within the stomach with the tip in the proximal body of the stomach. No free air is seen. There is streaky atelectasis in the lung bases. IMPRESSION: Feeding tube extends to the left within the stomach with the tip in the proximal body of the stomach. Electronically Signed   By: Telford Nab M.D.   On: 09/20/2022 02:09   MR 3D Recon At Scanner  Result Date: 09/19/2022 CLINICAL DATA:  MIP reconstructions of the biliary tree which were generated for  the MRCP, MRI of the abdomen with and without contrast which is reported separately. EXAM: 3-DIMENSIONAL MR IMAGE RENDERING ON ACQUISITION WORKSTATION TECHNIQUE: 3-dimensional MR images were rendered by post-processing of the original MR data on an acquisition workstation. The 3-dimensional MR images were interpreted and findings were reported in the accompanying complete MR report for this study COMPARISON:  MRCP reported separately. FINDINGS: MIP reconstructions in 3D were provided for interpretation of MRCP/MRI of the abdomen with and without contrast. The report is found under the a session number [id number] CHL. IMPRESSION: Please refer to report for MRI abdomen/MRCP with a session number outlined above. Electronically Signed   By: Zetta Bills M.D.   On: 09/19/2022 14:47   MR ABDOMEN MRCP W WO CONTAST  Result Date: 09/19/2022 CLINICAL DATA:  Acute, severe pancreatitis. EXAM: MRI ABDOMEN WITHOUT AND WITH CONTRAST (INCLUDING MRCP) TECHNIQUE: Multiplanar multisequence MR imaging of the abdomen was performed both before and after the administration of intravenous contrast. Heavily T2-weighted images of the biliary and pancreatic ducts were obtained, and three-dimensional MRCP images were rendered by post processing. CONTRAST:  1m GADAVIST GADOBUTROL 1 MMOL/ML IV SOLN COMPARISON:  September 15, 2022 and CT imaging from September 17, 2022 FINDINGS: Lower chest: Small bilateral pleural effusions. Basilar atelectasis. Cardiomegaly, lung bases not well assessed. Bilateral  breast implants in place. Hepatobiliary: Motion limited assessment of hepatic parenchyma without gross signs of focal, suspicious hepatic abnormality. Gallbladder mild-to-moderately distended with signs of pericholecystic fluid which is mildly increased compared to previous CT imaging from September 17, 2022 and is associated with perihepatic fluid and inflammation in the anterior pararenal space. No signs of biliary duct dilation. No visible  cholelithiasis. Attenuation of the distal common bile duct is smooth and long segment in the intrapancreatic portion of the common bile duct. Dilated main pancreatic duct is mildly irregular and is only mildly dilated. Suspect small Santorinicele at the minor papilla (image 33/15) configuration of ductal structures suggests either pancreatic divisum or dominant dorsal drainage via minor papilla which can be a risk factor for pancreatitis. Cystic duct inserts upon the midportion of the extrahepatic biliary tree just above the pancreatic portion of the common bile duct. Pancreas: Edema within the pancreas. Intrinsic T1 signal in the pancreas is largely preserved as is enhancement. Marked peripancreatic stranding. 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 Peripancreatic fluid collections some with signs of hemorrhagic or proteinaceous change, for instance in the lesser sac and area measuring 2.3 x 2.0 cm (image 48/19) fluid in the abdomen predominantly low on T1 and high on T2 with edema and hemorrhagic/proteinaceous material tracking throughout the root of the small bowel mesentery. Assessment of upper abdominal structures with markedly limited evaluation due to respiratory variation and motion. Portal vein and splenic vein remain patent. There is a small collection with mild mass effect posterior to the portal vein best seen on image 53/21, this measures 18 x 14 mm present on previous imaging, perhaps slightly enlarged. Spleen:  Splenic vein is patent.  Spleen normal size. Adrenals/Urinary Tract:  Adrenal glands are normal. Kidneys with smooth contours and Bosniak category II cysts for which no additional dedicated follow-up imaging is recommended. Largest 13 mm in the lower pole of the RIGHT kidney. No perinephric stranding. No hydronephrosis. Stomach/Bowel: Marked duodenal edema. Inflammation and likely fat necrosis or early fat necrosis at the root of the small bowel mesentery. Mild distension of the colon  with: Cut off the splenic flexure. Colon at 5 cm currently. Vascular/Lymphatic: No pathologically enlarged lymph nodes identified. No abdominal aortic aneurysm demonstrated. Other:  Flank edema bilaterally. Musculoskeletal: No suspicious bone lesions identified. IMPRESSION: 1. Findings of acute interstitial edematous pancreatitis with peripancreatic fluid collections some with signs of hemorrhagic or proteinaceous change. Appearance of peripancreatic changes with some worsening since prior imaging suggestive peripancreatic necrosis. Pancreatic ascites and more simple appearing fluid elsewhere with slight interval increase. 2. Scattered areas of acute pancreatic fluid and or peripancreatic necrosis associated with mass effect upon the portal vein and root of the small bowel mesentery. Venous structures remain patent. Short interval follow-up may be warranted to ensure continued patency. 3. No cholelithiasis or signs of choledocholithiasis. 4. Rather striking tapered narrowing of the mid common bile duct with biliary enhancement. This may be reactive secondary to pancreatitis and is not associated currently with biliary duct dilation. Given appearance of the biliary tree would also correlate with any laboratory evidence of IGg4 related disease that could explain biliary and pancreatic abnormalities though pancreatitis could be related to potential anatomic abnormality described below. 5. Dilated main pancreatic duct is mildly irregular and is only mildly dilated. Suspect small Santorinicele at the minor papilla. Findings may reflect either pancreatic divisum or dominant dorsal drainage via minor papilla which can be a risk factor for pancreatitis. In general assessment is limited by respiratory and patient  related motion. 6. Marked duodenal edema if there are continued symptoms which do not follow the trend of expected evolution for pancreatitis and or pancreatic enzymes could consider the possibility of either severe  secondary or primary duodenitis. 7. Inflammation of affecting the splenic flexure of the colon with upstream dilation is a common site of functional colonic narrowing or obstruction in the setting of pancreatitis. Suggest attention on follow-up. 8. Small bilateral pleural effusions and basilar atelectasis. 9. Motion limited assessment of hepatic parenchyma without gross signs of focal, suspicious hepatic abnormality. Electronically Signed   By: Zetta Bills M.D.   On: 09/19/2022 14:19   CT ABD PELVIS W/WO CM ONCOLOGY PANCREATIC PROTOCOL  Result Date: 09/17/2022 CLINICAL DATA:  Acute pancreatitis EXAM: CT ABDOMEN AND PELVIS WITHOUT AND WITH CONTRAST TECHNIQUE: Multidetector CT imaging of the abdomen and pelvis was performed following the standard protocol before and following the bolus administration of intravenous contrast. RADIATION DOSE REDUCTION: This exam was performed according to the departmental dose-optimization program which includes automated exposure control, adjustment of the mA and/or kV according to patient size and/or use of iterative reconstruction technique. CONTRAST:  148m OMNIPAQUE IOHEXOL 350 MG/ML SOLN COMPARISON:  09/15/2022 FINDINGS: Lower chest: New small bilateral pleural effusions and associated atelectasis or consolidation. Cardiomegaly with severe left atrial dilatation. Coronary artery calcifications. Partially imaged breast implants. Hepatobiliary: No solid liver abnormality is seen. Excreted contrast in the gallbladder. No gallstones, gallbladder wall thickening, or biliary dilatation. Pancreas: Diffuse fat stranding and inflammatory fluid about the pancreas and adjacent retroperitoneum, very similar to prior examination. No pancreatic parenchymal hypoenhancement or discrete pancreatic fluid collection. Spleen: Normal in size without significant abnormality. Adrenals/Urinary Tract: Adrenal glands are unremarkable. Kidneys are normal, without renal calculi, solid lesion, or  hydronephrosis. Excreted contrast in the bladder. Bladder is otherwise unremarkable. Stomach/Bowel: Stomach is within normal limits. Appendix appears normal. No evidence of bowel wall thickening, distention, or inflammatory changes. Descending and sigmoid diverticulosis. Vascular/Lymphatic: Aortic atherosclerosis. Unchanged prominent portacaval, retroperitoneal, and mesenteric lymph nodes. Reproductive: No mass or other significant abnormality. Other: No abdominal wall hernia or abnormality. Increased small volume ascites throughout the abdomen and pelvis. Musculoskeletal: No acute or significant osseous findings. IMPRESSION: 1. Diffuse fat stranding and inflammatory fluid about the pancreas and adjacent retroperitoneum, very similar to prior examination and consistent with acute pancreatitis. No pancreatic parenchymal hypoenhancement to suggest necrosis nor discrete pancreatic fluid collection. 2. Increased small volume ascites throughout the abdomen and pelvis. 3. New small bilateral pleural effusions and associated atelectasis or consolidation. 4. Cardiomegaly with severe left atrial dilatation. Coronary artery disease. Aortic Atherosclerosis (ICD10-I70.0). Electronically Signed   By: ADelanna AhmadiM.D.   On: 09/17/2022 14:41   [place] LIMITED RUQ (LIVER/GB)  Result Date: 09/15/2022 CLINICAL DATA:  Pancreatitis. EXAM: ULTRASOUND ABDOMEN LIMITED RIGHT UPPER QUADRANT COMPARISON:  CT abdomen and pelvis 09/15/2022. FINDINGS: Gallbladder: No gallstones or wall thickening visualized. No pericholecystic fluid. The sonographer imaged a tiny focal region of possible ongoing artifact questioning minimal adenomyomatosis in the region of the gallbladder fundus. No sonographic Murphy sign noted by sonographer. Common bile duct: Diameter: 4 mm, within normal limits. No intrahepatic or extrahepatic biliary ductal dilatation. Liver: No focal lesion identified. Within normal limits in parenchymal echogenicity. Minimal  perihepatic fluid as seen on today's CT. Portal vein is patent on color Doppler imaging with normal direction of blood flow towards the liver. Other: None. IMPRESSION: 1. Minimal perihepatic fluid as seen on today's CT. 2. No gallstones or sonographic evidence of acute cholecystitis.  Electronically Signed   By: Yvonne Kendall M.D.   On: 09/15/2022 10:22   CT ABDOMEN PELVIS W CONTRAST  Result Date: 09/15/2022 CLINICAL DATA:  Generalized abdominal pain with nausea and vomiting since yesterday EXAM: CT ABDOMEN AND PELVIS WITH CONTRAST TECHNIQUE: Multidetector CT imaging of the abdomen and pelvis was performed using the standard protocol following bolus administration of intravenous contrast. RADIATION DOSE REDUCTION: This exam was performed according to the departmental dose-optimization program which includes automated exposure control, adjustment of the mA and/or kV according to patient size and/or use of iterative reconstruction technique. CONTRAST:  167m OMNIPAQUE IOHEXOL 300 MG/ML  SOLN COMPARISON:  None Available. FINDINGS: Lower chest: Calcifications of the mitral annulus with left atrial dilation. Pulmonary edema. Small hiatal hernia. Partially visualized bilateral breast prostheses. Hepatobiliary: No suspicious hepatic lesion. Lingular excrescence of the caudate lobe, anatomic variant. Gallbladder is unremarkable. No biliary ductal dilation. Pancreas: Edema of the pancreatic parenchyma with peripancreatic fluid. No evidence of pancreatic necrosis, pancreatic ductal dilation or walled-off collections. Spleen: Splenomegaly or focal splenic lesion. Adrenals/Urinary Tract: Bilateral adrenal glands appear normal. No hydronephrosis. Tiny hypodense bilateral renal lesions are technically too small to accurately characterize but statistically likely to reflect cysts and considered benign requiring no independent imaging follow-up. Stomach/Bowel: Cerebral hiatal hernia. Reactive inflammation of the duodenum. No  pathologic dilation of small or large bowel. Normal appendix and terminal ileum. Colonic diverticulosis without findings of acute diverticulitis. Vascular/Lymphatic: Aortic atherosclerosis. Normal caliber abdominal aorta. Smooth IVC contours. Pathologically enlarged abdominal or pelvic lymph nodes. Reproductive: No acute or suspicious finding. Other: Peripancreatic fluid with fluid tracking along the bilateral pericolic gutters into the pelvis and also tracking along the mesenteric root into the small bowel mesentery. No pneumoperitoneum. No portal venous gas. Musculoskeletal: No acute osseous abnormality. Multilevel degenerative changes spine. IMPRESSION: 1. Acute interstitial pancreatitis. No evidence of pancreatic necrosis, pancreatic ductal dilation or walled-off collections. 2. Calcifications of the mitral annulus with left atrial dilation, consider further evaluation with cardiac echo if not previously performed. 3. Mild pulmonary edema. 4.  Aortic Atherosclerosis (ICD10-I70.0). Electronically Signed   By: JDahlia BailiffM.D.   On: 09/15/2022 08:16   DG Abdomen 1 View  Result Date: 09/15/2022 CLINICAL DATA:  Diffuse pain with emesis. EXAM: ABDOMEN - 1 VIEW COMPARISON:  None Available. FINDINGS: The IMPRESSION: Negative. Electronically Signed   By: TKerby MoorsM.D.   On: 09/15/2022 06:40   DG Chest Portable 1 View  Result Date: 09/15/2022 CLINICAL DATA:  Atrial fibrillation.  Chest pain.  Back pain. EXAM: PORTABLE CHEST 1 VIEW COMPARISON:  10/06/2019 FINDINGS: Stable cardiomediastinal contours. Pulmonary vascular congestion identified. No pleural effusion. Atelectasis identified within the left lung base. No airspace consolidation. Visualized osseous structures appear intact. IMPRESSION: 1. Pulmonary vascular congestion. 2. Left base atelectasis. Electronically Signed   By: TKerby MoorsM.D.   On: 09/15/2022 06:39    Labs: Basic Metabolic Panel: Recent Labs  Lab 09/29/22 0653 10/01/22 1359  10/03/22 0450 10/04/22 0634 10/05/22 0552  NA 131* 130* 132* 132* 133*  K 4.5 4.6 4.6 4.3 4.5  CL 100 97* 98 96* 98  CO2 25 24 25 27 27  $ GLUCOSE 244* 149* 135* 140* 124*  BUN 21 19 17 19 19  $ CREATININE 0.78 0.85 0.92 0.92 0.86  CALCIUM 8.9 9.0 9.2 9.0 8.9  MG 2.2 2.2 2.1  --   --   PHOS 3.1  --  4.5  --   --    CBC: Recent Labs  Lab  10/01/22 0550 10/02/22 0523 10/03/22 0450 10/04/22 0634 10/05/22 0552  WBC 9.7 9.0 9.0 8.0 6.5  HGB 11.3* 12.0 11.3* 10.8* 11.4*  HCT 34.4* 36.8 34.5* 33.7* 35.4*  MCV 89.1 88.9 89.8 89.4 89.4  PLT 423* 438* 410* 382 377   Microbiology: Results for orders placed or performed during the hospital encounter of 09/15/22  Blood culture (routine x 2)     Status: None   Collection Time: 09/15/22  6:09 AM   Specimen: BLOOD  Result Value Ref Range Status   Specimen Description BLOOD  RIGHT [facility]  Final   Special Requests   Final    BOTTLES DRAWN AEROBIC AND ANAEROBIC Blood Culture results may not be optimal due to an excessive volume of blood received in culture bottles   Culture   Final    NO GROWTH 5 DAYS Performed at [facility], [address], [place], [place] [address]    Report Status 09/20/2022 FINAL  Final  Blood culture (routine x 2)     Status: None   Collection Time: 09/15/22  6:09 AM   Specimen: BLOOD  Result Value Ref Range Status   Specimen Description BLOOD  LEFT [facility]  Final   Special Requests   Final    BOTTLES DRAWN AEROBIC AND ANAEROBIC Blood Culture adequate volume   Culture   Final    NO GROWTH 5 DAYS Performed at [facility], [address] [place], [place] [address]    Report Status 09/20/2022 FINAL  Final   Time coordinating discharge: Over 30 minutes  Richarda Osmond, MD  [facility] 10/05/2022, 1:49 PM

## 2022-10-05 NOTE — Progress Notes (Incomplete)
.. Progress Note   Patient: Molly Brown IEP:[id number] DOB: 11-04-1950 DOA: 09/15/2022     20 DOS: the patient was seen and examined on 10/05/2022   Brief hospital course: 72 y.o. 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 Patient presents for evaluation of sudden onset abdominal pain mostly in the epigastrium, periumbilical area.  She rates her pain a 10 x 10 in intensity at its worst.  Pain is nonradiating and is associated with nausea and multiple episodes of emesis.  She states that she has had issues with abdominal pain in the past usually self-limiting but this is the worst pain she has had which prompted her visit to the emergency room.  She is unable to tell me if her prior episodes of pain related to meals.  She has urinary frequency and has dyspnea with exertion which is chronic.  Her last bowel movement was 2 days prior to this admission.  According to the patient she usually has daily bowel movements She denies having any fever, no chills, no chest pain, no headache, no dizziness, no lightheadedness, no cough, no blurred vision no focal deficit. Abnormal labs include lactic acid of 2.0, lipase 539, troponin 24, white count 15.8 CT scan of abdomen and pelvis shows acute interstitial pancreatitis. No evidence of pancreatic necrosis, pancreatic ductal dilation or walled-off collections. Calcifications of the mitral annulus with left atrial dilation, consider further evaluation with cardiac echo if not previously performed. Mild pulmonary edema. Aortic Atherosclerosis. Twelve-lead EKG reviewed by me shows rapid A-fib Patient received 2 L IV fluid bolus, Cardizem 20 mg IV push x 1 and is currently on a Cardizem drip.   1/19.   Tapered off Cardizem drip and on oral metoprolol and oral Cardizem CD.  Started empiric antibiotics for pancreatitis since procalcitonin elevated and leukocytosis. 1/20.  Consulted general surgery with ?gallstone pancreatitis.  Surgery ordered for a repeat CAT scan.  Holding Eliquis and started heparin drip just in case surgery needed. 1/21.  Patient pulled out her IV and wanted to go home.  Patient was reoriented and then agreeable to stay and to surgery if needed.  Advanced diet to full liquids. 1/22.  Bilirubin rising, MRCP showed acute interstitial edematous pancreatitis with peripancreatic fluid collections with some signs of hemorrhagic or proteinaceous change.  Some peripancreatic necrosis seen.  Pancreatic ascites.  Rather striking tapered narrowing of the mid common bile duct with biliary enhancement.  Dilated main pancreatic duct.  Findings may reflect pancreatic divisum or dominant dorsal drainage via minor papilla. 1/23.  Dobbhoff tube had to be advanced twice.  Last x-ray showing in the duodenal bulb.  Okay to start tube feedings.   1/24: further K replacement.  Mental status worse again today.  Tube feeds running and appears to be tolerating okay.  Stopped antibiotics to monitor. 1/25: pt sleeping a lot. Fever 100.4 F last night, recurrent leukocytosis also.  Resumed Zosyn.  Replaced phos.  Sodium declining slowly. Cardiology gave IV Lasix. 1/26: repeated CT, has more pancreatic fluid collections but no necrosis. New finding of acute diverticulitis with contained perforation. Surgery re-consulted.  Starting TPN and continuing antibiotics 1/27 >> 1/30:  On TPN, IV antibiotics.  Repeat CT slightly larger ?early abscess / fluid collection. Remains NPO. 1/31: surgery resuming Dobhoff trickle feeds. Leukocytosis  and LLQ tenderness improving.  2/2-2/4: pain gradually improving, diet gradually advancing. Continues on IV Abx for diverticulitis.  NG tube feeds continue to maintain nutritional  status.  2/5: would expect to advance diet and dc within a couple days 2/6: patient is having mild discomfort and nausea with PO intake still. Resolved with analgesic and antiemetics. Surgery recommends NG tube until tomorrow. If she remains stable, can likely be removed and discharged home.   Assessment and Plan: Severe acute pancreatitis- improving Acute severe pancreatitis with increased fluid collections since prior CT but no evidence of necrosis on repeat CT (1/26).   On empiric Zosyn - continuing for diverticulitis. General surgery no plan for cholecystectomy The patient does not drink alcohol.  No new medications.  Triglycerides normal.  MRCP shows the possibility of pancreatic divisum which could increase risk of pancreatitis.   Lipase normalized pretty quickly. GI consulted, have signed off. Tube feeds were held for acute diverticulitis with contained perf. Per surgery, patient continues to stay on tube feeds for now.  Tolerating diet moderately well and will advance per surgery recs. Will need close GI follow up for ongoing care of pancreatitis and repeat imaging.  Acute diverticulitis Repeated CT 1/26 to re-assess pancreatitis and patient now has sigmoid diverticulitis with contained perforation. no peritoneal signs. No tenderness to abdominal exam --General surgery following -- on Dobhoff tube feeds per surgery --Completed Zosyn --Pain control, antiemetics PRN --Monitor abdominal exam closely  Acute metabolic encephalopathy Resolved.  Had some delirium when more ill and getting IV pain meds. --Delirium precautions --Minimize sedating meds  Atrial fibrillation with RVR (HCC) Chronic in nature.  Continue metoprolol and Cardizem CD.   Currently on heparin drip- will transition to eliquis as she is not likely to have need for another procedure.   Situational anxiety Pt reports significant anxiety and family note she has physical shaking at times when anxious.  She reports  some baseline anxiety, but severely exacerbated by  her current situation. --Low dose IV Valium PRN for anxiety.  Resolved, post repletion, hypophosphatemia Last Phos 3.7 Monitor and further replace PRN.  Hyponatremia, improved- Na+ 130>>132 Monitor BMP and volume status.  Resolved hypokalemia Monitor BMP  Obesity (BMI 30-39.9) Body mass index is 33.45 kg/m. Complicates overall care and prognosis.  Recommend lifestyle modifications including physical activity and diet for weight loss and overall long-term health.  HOCM (hypertrophic obstructive cardiomyopathy) ([place]) Patient with a history of HOCM Continue metoprolol.  Hypothyroidism Continue Synthroid  GERD (gastroesophageal reflux disease) Continue PPI   {Tip this will not be part of the note when signed Body mass index is 34.45 kg/m. ,  Nutrition Documentation    Flowsheet Row ED to Hosp-Admission (Current) from 09/15/2022 in [facility]  Nutrition Problem Increased nutrient needs  Etiology acute illness  [pancreatitis]  Nutrition Goal Patient will meet greater than or equal to 90% of their needs  Interventions Tube feeding     ,  (Optional):26781}  Physical Exam: Vitals:   10/05/22 0300 10/05/22 0500 10/05/22 0530 10/05/22 0536  BP:    112/68  Pulse:    77  Resp: 14 14 19 20   Temp:    97.7 F (36.5 C)  TempSrc:    Oral  SpO2:    99%  Weight:      Height:       General exam: awake, alert, no acute distress, obese HEENT: Dobhoff NG tube in place. moist mucus membranes, hearing grossly normal  Respiratory system: on  room air, normal respiratory effort. Lungs clear Cardiovascular system: Regular rate and rhythm no rubs or gallops.   Gastrointestinal system: Abdomen is soft with bowel sounds present.  Non-tender to palpation Central nervous system: A&Ox4, no gross focal neurologic deficits, normal speech Extremities: moves all, mild edema, normal tone Skin: dry, intact, normal  temperature Psychiatry: normal mood, congruent affect, judgement and insight appear normal   Data Reviewed:    Latest Ref Rng & Units 10/05/2022    5:52 AM 10/04/2022    6:34 AM 10/03/2022    4:50 AM  BMP  Glucose 70 - 99 mg/dL 124  140  135   BUN 8 - 23 mg/dL 19  19  17    Creatinine 0.44 - 1.00 mg/dL 0.86  0.92  0.92   Sodium 135 - 145 mmol/L 133  132  132   Potassium 3.5 - 5.1 mmol/L 4.5  4.3  4.6   Chloride 98 - 111 mmol/L 98  96  98   CO2 22 - 32 mmol/L 27  27  25    Calcium 8.9 - 10.3 mg/dL 8.9  9.0  9.2   CBC    Component Value Date/Time   WBC 6.5 10/05/2022 0552   RBC 3.96 10/05/2022 0552   HGB 11.4 (L) 10/05/2022 0552   HCT 35.4 (L) 10/05/2022 0552   PLT 377 10/05/2022 0552   MCV 89.4 10/05/2022 0552   MCH 28.8 10/05/2022 0552   MCHC 32.2 10/05/2022 0552   RDW 15.4 10/05/2022 0552   RDW 14.2 02/21/2014 0750   LYMPHSABS 1.2 09/15/2022 0606   LYMPHSABS 1.6 02/21/2014 0750   MONOABS 0.6 09/15/2022 0606   EOSABS 0.0 09/15/2022 0606   EOSABS 0.2 02/21/2014 0750   BASOSABS 0.0 09/15/2022 0606   BASOSABS 0.0 02/21/2014 0750   Repeat CT abdomen/pelvis with IV contrast 1/29 --- persistent diverticulitis, slight enlargement of sigmoid fluid collection, suspected to be developing abscess, no extraluminal air.  Persistent pancreatitis changes with more confluent fluid collection anteriorly and inferiorly.  Repeat CT abdomen/pelvis with IV contrast 1/26  AM --- new sigmoid diverticulitis and more pancreatic fluid collections, but none are rim enhancing, no evidence of necrosis.  Family Communication: sister at bedside  Disposition: Status is: Inpatient Remains inpatient appropriate because: enteral feeds, IV antibiotics. Likely to progress to dc in a couple days   Planned Discharge Destination: Home with Home Health {Tip this will not be part of the note when signed  DVT Prophylaxis  .Apixaban (eliquis) tablet 5 mg ,  (Optional):26781}   Time spent: 36  minutes  Author: Richarda Osmond, MD 10/05/2022 7:45 AM  For on call review [email or web address].

## 2022-10-05 NOTE — Progress Notes (Signed)
Physical Therapy Treatment Patient Details Name: Molly Brown MRN: [id number] DOB: 1951/02/26 Today's Date: 10/05/2022   History of Present Illness Pt is a 72 y.o. 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  MD assessment includes: Acute metabolic encephalopathy, hypokalemia, and hypertrophic obstructive cardiomyopathy.    PT Comments    Therapist in to see pt this am. Pt resting in bed, discussed at length current LOF, d/c needs, and recommendations for safe transition home. Pt currently does not appear in need of HHPT. Standing B LE HEP given and demonstrated with good understanding. Pt also encouraged to increase strength and endurance by walking on level surfaces at nearby mall as tolerated. Pt declines need for DME. Will continue to follow acutely until d/c.   Recommendations for follow up therapy are one component of a multi-disciplinary discharge planning process, led by the attending physician.  Recommendations may be updated based on patient status, additional functional criteria and insurance authorization.  Follow Up Recommendations  No PT follow up     Assistance Recommended at Discharge Intermittent Supervision/Assistance  Patient can return home with the following A little help with walking and/or transfers;A little help with bathing/dressing/bathroom;Assist for transportation   Equipment Recommendations  None recommended by PT    Recommendations for Other Services  (none)     Precautions / Restrictions Precautions Precautions: Fall Restrictions Weight Bearing Restrictions: No     Mobility  Bed Mobility Overal bed mobility: Modified Independent                  Transfers Overall transfer level: Modified independent                 General transfer comment:  use of UE's to transition to standing    Ambulation/Gait                   Stairs             Wheelchair Mobility    Modified Rankin (Stroke Patients Only)       Balance Overall balance assessment: Modified Independent   Sitting balance-Leahy Scale: Normal     Standing balance support: Single extremity supported, During functional activity Standing balance-Leahy Scale: Good Standing balance comment: reliant on at least SUE support                            Cognition Arousal/Alertness: Awake/alert Behavior During Therapy: WFL for tasks assessed/performed Overall Cognitive Status: Within Functional Limits for tasks assessed                                 General Comments: Pleasant and very conversational        Exercises General Exercises - Lower Extremity Hip ABduction/ADduction: Standing, 5 reps Hip Flexion/Marching: Standing, 5 reps Heel Raises: Standing, 5 reps Mini-Sqauts: Standing, 5 reps Other Exercises Other Exercises: Hamstring curls standing x 5, Hip extension standing x 5    General Comments General comments (skin integrity, edema, etc.): NGT remains intact. Discussed at length current LOF and d/c needs. Pt and husband educated on fall prevention at home as well as recommendations to complete standing B LE HEP and progressive walking distance on level surface to regain strength and endurance.  Pertinent Vitals/Pain Pain Assessment Pain Assessment: No/denies pain    Home Living                          Prior Function            PT Goals (current goals can now be found in the care plan section) Acute Rehab PT Goals Patient Stated Goal: To get stronger Progress towards PT goals: Progressing toward goals    Frequency    Min 2X/week      PT Plan Current plan remains appropriate    Co-evaluation              AM-PAC PT "6 Clicks" Mobility   Outcome Measure  Help needed  turning from your back to your side while in a flat bed without using bedrails?: None Help needed moving from lying on your back to sitting on the side of a flat bed without using bedrails?: None Help needed moving to and from a bed to a chair (including a wheelchair)?: A Little Help needed standing up from a chair using your arms (e.g., wheelchair or bedside chair)?: A Little Help needed to walk in hospital room?: A Little Help needed climbing 3-5 steps with a railing? : A Little 6 Click Score: 20    End of Session   Activity Tolerance: Patient tolerated treatment well Patient left: with family/visitor present Nurse Communication: Mobility status PT Visit Diagnosis: Muscle weakness (generalized) (M62.81);Difficulty in walking, not elsewhere classified (R26.2);Pain     Time: 2774-1287 PT Time Calculation (min) (ACUTE ONLY): 28 min  Charges:  $Therapeutic Exercise: 8-22 mins $Therapeutic Activity: 8-22 mins                    Mikel Cella, PTA  Molly Brown 10/05/2022, 11:35 AM

## 2022-10-05 NOTE — TOC Progression Note (Signed)
Transition of Care [facility]) - Progression Note    Patient Details  Name: Molly Brown MRN: [id number] Date of Birth: 04-22-1951  Transition of Care [facility]) CM/SW Contact  Laurena Slimmer, RN Phone Number: 10/05/2022, 12:46 PM  Clinical Narrative:    Spoke with patient's spouse. Patient declined to received HH. They were advised to follow up with PCP after discharge if [facility] is requested after discharge.    Expected Discharge Plan: [place] Barriers to Discharge: Continued Medical Work up  Expected Discharge Plan and Services       Living arrangements for the past 2 months: Single Family Home                                       Social Determinants of Health (SDOH) Interventions SDOH Screenings   Food Insecurity: No Food Insecurity (09/16/2022)  Housing: Low Risk  (09/16/2022)  Transportation Needs: No Transportation Needs (09/16/2022)  Utilities: Not At Risk (09/16/2022)  Tobacco Use: Medium Risk (09/26/2022)    Readmission Risk Interventions     No data to display

## 2022-10-05 NOTE — Discharge Instructions (Signed)
Please follow up with cardiology within 1-2 weeks to discuss your atrial fibrillation. A new medication called cardizem was prescribed. Please take this as directed.   Please follow up with general surgery in about 3 weeks to discuss your pancreas and colon health.   I recommend avoiding diet high in fried or greasy foods as well as alcohol to help prevent irritating your condition.

## 2022-10-17 ENCOUNTER — Other Ambulatory Visit: Payer: Self-pay

## 2022-10-17 ENCOUNTER — Encounter: Payer: Self-pay | Admitting: Surgery

## 2022-10-17 ENCOUNTER — Ambulatory Visit: Payer: Medicare HMO | Admitting: Surgery

## 2022-10-17 VITALS — BP 129/82 | HR 86 | Temp 97.9°F | Ht 71.0 in | Wt 210.6 lb

## 2022-10-17 DIAGNOSIS — K5732 Diverticulitis of large intestine without perforation or abscess without bleeding: Secondary | ICD-10-CM

## 2022-10-17 DIAGNOSIS — K5780 Diverticulitis of intestine, part unspecified, with perforation and abscess without bleeding: Secondary | ICD-10-CM

## 2022-10-17 NOTE — Patient Instructions (Addendum)
Referral to [employer] may call their office to schedule an appointment.   We will call you the end of April to schedule an appointment for early May. Please call our office with any questions or concerns.   Please try eating protein rich foods.

## 2022-10-18 ENCOUNTER — Encounter: Payer: Self-pay | Admitting: Surgery

## 2022-10-18 NOTE — Progress Notes (Signed)
Outpatient Surgical Follow Up  10/18/2022  Molly Brown is an 72 y.o. female.   Chief Complaint  Patient presents with   New Patient (Initial Visit)    diverticulitis    HPI: This 72 year old female with recent hospitalization for pancreatitis as well as perforated diverticulitis that has improved.  She is clinically improving slowly she is tolerating diet.  No fevers no chills she has completed antibiotic therapy.  Last time she did have a CT scan a few weeks ago showing evidence of pancreatitis and contained perforated diverticulitis.  He is having bowel movements.  She reports she has never had a colonoscopy before.  She still very weak. SHe is in chronic A-fib and anticoagulated.  Also has an upcoming cardiology follow-up  Past Medical History:  Diagnosis Date   AF (atrial fibrillation) (HCC)    Anal fissure    Anxiety    Cardiac arrhythmia due to congenital heart disease    Chicken pox    Depression    GERD (gastroesophageal reflux disease)    Heart murmur     Past Surgical History:  Procedure Laterality Date   ABDOMINAL HYSTERECTOMY     AUGMENTATION MAMMAPLASTY Bilateral 1999   BREAST ENHANCEMENT SURGERY  2000   TONSILLECTOMY  1972   TOOTH EXTRACTION      Family History  Problem Relation Age of Onset   Arthritis Mother    Heart disease Father    Arthritis Maternal Grandmother    Arthritis Paternal Grandmother    Heart disease Paternal Grandmother    Breast cancer Neg Hx     Social History:  reports that she has quit smoking. Her smoking use included cigarettes. She has never used smokeless tobacco. She reports that she does not drink alcohol and does not use drugs.  Allergies:  Allergies  Allergen Reactions   Eszopiclone Other (See Comments)    Felt really weird   Flecainide Other (See Comments) and Rash    Insomnia and felt drugged    Medications reviewed.    ROS Full ROS performed and is otherwise negative other than what is stated in HPI   BP  129/82   Pulse 86   Temp 97.9 F (36.6 C) (Oral)   Ht 5' 11"$  (1.803 m)   Wt 210 lb 9.6 oz (95.5 kg)   SpO2 99%   BMI 29.37 kg/m   Physical Exam CONSTITUTIONAL: Chronically ill. EYES: Pupils are equal, round,  Sclera are non-icteric. EARS, NOSE, MOUTH AND THROAT: TThe oral mucosa is pink and moist. Hearing is intact to voice. LYMPH NODES:  Lymph nodes in the neck are normal. RESPIRATORY:  Lungs are clear. There is normal respiratory effort, with equal breath sounds bilaterally, and without pathologic use of accessory muscles. CARDIOVASCULAR: Heart is iregular , a fib without murmurs, gallops, or rubs. GI: The abdomen is  soft, very minimal tenderness LLQ w/o peritonitis,  There are no palpable masses. There is no hepatosplenomegaly. There are normal bowel sounds in all quadrants. GU: Rectal deferred.   MUSCULOSKELETAL: Normal muscle strength and tone. No cyanosis or edema.   SKIN: Turgor is good and there are no pathologic skin lesions or ulcers. NEUROLOGIC: Motor and sensation is grossly normal. Cranial nerves are grossly intact. PSYCH:  Oriented to person, place and time. Affect is normal.    Assessment/Plan: 72 year old female recent idiopathic pancreatitis and evidence of diverticulitis with contained perforation.  She continues to improve clinically.  Had an extensive discussion with the patient and the family regarding her  disease process.  I do think that she will benefit from colonoscopy as well as GI evaluation in the next few months.  She has never had a colonoscopy.  Regarding pancreatitis and potential cholecystectomy I would like to hold off on any surgical intervention at this time.  As far as the diverticulitis this is concerning given that she did have complicated diverticulitis.  Again currently no need for surgical invention but I will like to continue to assess her and see her back in a couple months  Please note that I have spent 45 minutes in this encounter including  personally reviewing imaging studies, coordinating her care, placing orders, counseling the patient and performing appropriate documentation     Caroleen Hamman, MD [place] Surgeon

## 2022-10-27 DIAGNOSIS — I482 Chronic atrial fibrillation, unspecified: Secondary | ICD-10-CM | POA: Diagnosis not present

## 2022-10-27 DIAGNOSIS — R7309 Other abnormal glucose: Secondary | ICD-10-CM | POA: Diagnosis not present

## 2022-10-27 DIAGNOSIS — E669 Obesity, unspecified: Secondary | ICD-10-CM | POA: Diagnosis not present

## 2022-10-27 DIAGNOSIS — I340 Nonrheumatic mitral (valve) insufficiency: Secondary | ICD-10-CM | POA: Diagnosis not present

## 2022-10-27 DIAGNOSIS — E785 Hyperlipidemia, unspecified: Secondary | ICD-10-CM | POA: Diagnosis not present

## 2022-10-27 DIAGNOSIS — G4733 Obstructive sleep apnea (adult) (pediatric): Secondary | ICD-10-CM | POA: Diagnosis not present

## 2022-10-27 DIAGNOSIS — I421 Obstructive hypertrophic cardiomyopathy: Secondary | ICD-10-CM | POA: Diagnosis not present

## 2022-10-27 DIAGNOSIS — Z7901 Long term (current) use of anticoagulants: Secondary | ICD-10-CM | POA: Diagnosis not present

## 2022-10-27 DIAGNOSIS — I517 Cardiomegaly: Secondary | ICD-10-CM | POA: Diagnosis not present

## 2022-11-02 DIAGNOSIS — E039 Hypothyroidism, unspecified: Secondary | ICD-10-CM | POA: Diagnosis not present

## 2022-12-08 DIAGNOSIS — R7303 Prediabetes: Secondary | ICD-10-CM | POA: Diagnosis not present

## 2022-12-08 DIAGNOSIS — I482 Chronic atrial fibrillation, unspecified: Secondary | ICD-10-CM | POA: Diagnosis not present

## 2022-12-08 DIAGNOSIS — E039 Hypothyroidism, unspecified: Secondary | ICD-10-CM | POA: Diagnosis not present

## 2022-12-08 DIAGNOSIS — E538 Deficiency of other specified B group vitamins: Secondary | ICD-10-CM | POA: Diagnosis not present

## 2022-12-15 DIAGNOSIS — I421 Obstructive hypertrophic cardiomyopathy: Secondary | ICD-10-CM | POA: Diagnosis not present

## 2022-12-15 DIAGNOSIS — R7303 Prediabetes: Secondary | ICD-10-CM | POA: Diagnosis not present

## 2022-12-15 DIAGNOSIS — Z0000 Encounter for general adult medical examination without abnormal findings: Secondary | ICD-10-CM | POA: Diagnosis not present

## 2022-12-15 DIAGNOSIS — Z1211 Encounter for screening for malignant neoplasm of colon: Secondary | ICD-10-CM | POA: Diagnosis not present

## 2022-12-15 DIAGNOSIS — E039 Hypothyroidism, unspecified: Secondary | ICD-10-CM | POA: Diagnosis not present

## 2022-12-15 DIAGNOSIS — I482 Chronic atrial fibrillation, unspecified: Secondary | ICD-10-CM | POA: Diagnosis not present

## 2022-12-27 DIAGNOSIS — Z1211 Encounter for screening for malignant neoplasm of colon: Secondary | ICD-10-CM | POA: Diagnosis not present

## 2022-12-29 ENCOUNTER — Emergency Department: Payer: Medicare HMO

## 2022-12-29 ENCOUNTER — Inpatient Hospital Stay
Admission: EM | Admit: 2022-12-29 | Discharge: 2023-01-03 | DRG: 439 | Disposition: A | Discharge status: Home / Self Care | Payer: Medicare HMO | Attending: Internal Medicine | Admitting: Internal Medicine

## 2022-12-29 ENCOUNTER — Other Ambulatory Visit: Payer: Self-pay

## 2022-12-29 ENCOUNTER — Inpatient Hospital Stay: Payer: Medicare HMO

## 2022-12-29 DIAGNOSIS — K859 Acute pancreatitis without necrosis or infection, unspecified: Principal | ICD-10-CM | POA: Insufficient documentation

## 2022-12-29 DIAGNOSIS — R079 Chest pain, unspecified: Secondary | ICD-10-CM | POA: Diagnosis not present

## 2022-12-29 DIAGNOSIS — K219 Gastro-esophageal reflux disease without esophagitis: Secondary | ICD-10-CM | POA: Diagnosis present

## 2022-12-29 DIAGNOSIS — Z79890 Hormone replacement therapy (postmenopausal): Secondary | ICD-10-CM

## 2022-12-29 DIAGNOSIS — I421 Obstructive hypertrophic cardiomyopathy: Secondary | ICD-10-CM | POA: Diagnosis present

## 2022-12-29 DIAGNOSIS — Z87891 Personal history of nicotine dependence: Secondary | ICD-10-CM | POA: Diagnosis not present

## 2022-12-29 DIAGNOSIS — E782 Mixed hyperlipidemia: Secondary | ICD-10-CM | POA: Diagnosis present

## 2022-12-29 DIAGNOSIS — I482 Chronic atrial fibrillation, unspecified: Secondary | ICD-10-CM | POA: Diagnosis present

## 2022-12-29 DIAGNOSIS — D72828 Other elevated white blood cell count: Secondary | ICD-10-CM | POA: Diagnosis not present

## 2022-12-29 DIAGNOSIS — E872 Acidosis, unspecified: Secondary | ICD-10-CM | POA: Insufficient documentation

## 2022-12-29 DIAGNOSIS — R0602 Shortness of breath: Secondary | ICD-10-CM | POA: Diagnosis not present

## 2022-12-29 DIAGNOSIS — E871 Hypo-osmolality and hyponatremia: Secondary | ICD-10-CM | POA: Diagnosis not present

## 2022-12-29 DIAGNOSIS — G4733 Obstructive sleep apnea (adult) (pediatric): Secondary | ICD-10-CM | POA: Diagnosis present

## 2022-12-29 DIAGNOSIS — I4891 Unspecified atrial fibrillation: Secondary | ICD-10-CM | POA: Diagnosis not present

## 2022-12-29 DIAGNOSIS — R1110 Vomiting, unspecified: Secondary | ICD-10-CM | POA: Diagnosis not present

## 2022-12-29 DIAGNOSIS — Z8261 Family history of arthritis: Secondary | ICD-10-CM

## 2022-12-29 DIAGNOSIS — E876 Hypokalemia: Principal | ICD-10-CM | POA: Diagnosis present

## 2022-12-29 DIAGNOSIS — E039 Hypothyroidism, unspecified: Secondary | ICD-10-CM | POA: Diagnosis present

## 2022-12-29 DIAGNOSIS — Z7901 Long term (current) use of anticoagulants: Secondary | ICD-10-CM | POA: Diagnosis not present

## 2022-12-29 DIAGNOSIS — R0609 Other forms of dyspnea: Secondary | ICD-10-CM | POA: Diagnosis present

## 2022-12-29 DIAGNOSIS — K861 Other chronic pancreatitis: Secondary | ICD-10-CM | POA: Diagnosis present

## 2022-12-29 DIAGNOSIS — E669 Obesity, unspecified: Secondary | ICD-10-CM | POA: Diagnosis present

## 2022-12-29 DIAGNOSIS — F419 Anxiety disorder, unspecified: Secondary | ICD-10-CM | POA: Diagnosis present

## 2022-12-29 DIAGNOSIS — Z79899 Other long term (current) drug therapy: Secondary | ICD-10-CM

## 2022-12-29 DIAGNOSIS — R112 Nausea with vomiting, unspecified: Secondary | ICD-10-CM | POA: Diagnosis not present

## 2022-12-29 DIAGNOSIS — I10 Essential (primary) hypertension: Secondary | ICD-10-CM | POA: Diagnosis present

## 2022-12-29 DIAGNOSIS — Z8249 Family history of ischemic heart disease and other diseases of the circulatory system: Secondary | ICD-10-CM

## 2022-12-29 DIAGNOSIS — Z6834 Body mass index (BMI) 34.0-34.9, adult: Secondary | ICD-10-CM

## 2022-12-29 DIAGNOSIS — F32A Depression, unspecified: Secondary | ICD-10-CM | POA: Diagnosis present

## 2022-12-29 DIAGNOSIS — Z888 Allergy status to other drugs, medicaments and biological substances status: Secondary | ICD-10-CM

## 2022-12-29 DIAGNOSIS — I700 Atherosclerosis of aorta: Secondary | ICD-10-CM | POA: Diagnosis not present

## 2022-12-29 DIAGNOSIS — K8500 Idiopathic acute pancreatitis without necrosis or infection: Secondary | ICD-10-CM | POA: Diagnosis not present

## 2022-12-29 DIAGNOSIS — R109 Unspecified abdominal pain: Secondary | ICD-10-CM | POA: Diagnosis not present

## 2022-12-29 LAB — URINALYSIS, ROUTINE W REFLEX MICROSCOPIC
Bilirubin Urine: NEGATIVE
Glucose, UA: NEGATIVE mg/dL
Hgb urine dipstick: NEGATIVE
Ketones, ur: 20 mg/dL — AB
Leukocytes,Ua: NEGATIVE
Nitrite: NEGATIVE
Protein, ur: 30 mg/dL — AB
Specific Gravity, Urine: >1.046 — ABNORMAL HIGH (ref 1.005–1.030)
pH: 5 (ref 5.0–8.0)

## 2022-12-29 LAB — CBC
HCT: 40.6 % (ref 36.0–46.0)
Hemoglobin: 13.6 g/dL (ref 12.0–15.0)
MCH: 28.8 pg (ref 26.0–34.0)
MCHC: 33.5 g/dL (ref 30.0–36.0)
MCV: 86 fL (ref 80.0–100.0)
Platelets: 235 10*3/uL (ref 150–400)
RBC: 4.72 MIL/uL (ref 3.87–5.11)
RDW: 12.9 % (ref 11.5–15.5)
WBC: 9.4 10*3/uL (ref 4.0–10.5)
nRBC: 0 % (ref 0.0–0.2)

## 2022-12-29 LAB — MAGNESIUM: Magnesium: 1.8 mg/dL (ref 1.7–2.4)

## 2022-12-29 LAB — COMPREHENSIVE METABOLIC PANEL
ALT: 9 U/L (ref 0–44)
AST: 14 U/L — ABNORMAL LOW (ref 15–41)
Albumin: 2.5 g/dL — ABNORMAL LOW (ref 3.5–5.0)
Alkaline Phosphatase: 33 U/L — ABNORMAL LOW (ref 38–126)
Anion gap: 5 (ref 5–15)
BUN: 16 mg/dL (ref 8–23)
CO2: 14 mmol/L — ABNORMAL LOW (ref 22–32)
Calcium: 6.1 mg/dL — CL (ref 8.9–10.3)
Chloride: 120 mmol/L — ABNORMAL HIGH (ref 98–111)
Creatinine, Ser: 0.42 mg/dL — ABNORMAL LOW (ref 0.44–1.00)
GFR, Estimated: >60 mL/min (ref >60)
Glucose, Bld: 114 mg/dL — ABNORMAL HIGH (ref 70–99)
Potassium: 2.1 mmol/L — CL (ref 3.5–5.1)
Sodium: 139 mmol/L (ref 135–145)
Total Bilirubin: 0.8 mg/dL (ref 0.3–1.2)
Total Protein: 4.6 g/dL — ABNORMAL LOW (ref 6.5–8.1)

## 2022-12-29 LAB — PHOSPHORUS: Phosphorus: 3.1 mg/dL (ref 2.5–4.6)

## 2022-12-29 LAB — LIPASE, BLOOD: Lipase: 708 U/L — ABNORMAL HIGH (ref 11–51)

## 2022-12-29 MED ORDER — MORPHINE SULFATE (PF) 4 MG/ML IV SOLN
4.0000 mg | INTRAVENOUS | Status: DC | PRN
  Administered 2022-12-29 – 2022-12-30 (×2): 4 mg via INTRAVENOUS
  Filled 2022-12-29 (×2): qty 1

## 2022-12-29 MED ORDER — ACETAMINOPHEN 650 MG RE SUPP: 650.0000 mg | Freq: Four times a day (QID) | RECTAL | Status: DC | PRN

## 2022-12-29 MED ORDER — METOPROLOL TARTRATE 50 MG PO TABS
50.0000 mg | ORAL_TABLET | Freq: Two times a day (BID) | ORAL | Status: DC
  Administered 2022-12-29 – 2023-01-02 (×9): 50 mg via ORAL
  Filled 2022-12-29 (×8): qty 1
  Filled 2022-12-29: qty 2
  Filled 2022-12-29: qty 1

## 2022-12-29 MED ORDER — POTASSIUM CHLORIDE 10 MEQ/100ML IV SOLN
10.0000 meq | Freq: Once | INTRAVENOUS | Status: DC
  Administered 2022-12-29: 10 meq via INTRAVENOUS
  Filled 2022-12-29: qty 100

## 2022-12-29 MED ORDER — STERILE WATER FOR INJECTION IV SOLN: INTRAVENOUS | Status: DC

## 2022-12-29 MED ORDER — MORPHINE SULFATE (PF) 4 MG/ML IV SOLN
4.0000 mg | Freq: Once | INTRAVENOUS | Status: AC
  Administered 2022-12-29: 4 mg via INTRAVENOUS
  Filled 2022-12-29: qty 1

## 2022-12-29 MED ORDER — PANTOPRAZOLE SODIUM 40 MG PO TBEC
80.0000 mg | DELAYED_RELEASE_TABLET | Freq: Every day | ORAL | Status: DC
  Administered 2022-12-30 – 2023-01-03 (×5): 80 mg via ORAL
  Filled 2022-12-29 (×5): qty 2

## 2022-12-29 MED ORDER — SENNOSIDES-DOCUSATE SODIUM 8.6-50 MG PO TABS: 1.0000 | ORAL_TABLET | Freq: Every evening | ORAL | Status: DC | PRN

## 2022-12-29 MED ORDER — CALCIUM GLUCONATE-NACL 1-0.675 GM/50ML-% IV SOLN
1.0000 g | Freq: Once | INTRAVENOUS | Status: AC
  Administered 2022-12-29: 1000 mg via INTRAVENOUS
  Filled 2022-12-29: qty 50

## 2022-12-29 MED ORDER — POTASSIUM CHLORIDE CRYS ER 20 MEQ PO TBCR
40.0000 meq | EXTENDED_RELEASE_TABLET | Freq: Once | ORAL | Status: AC
  Administered 2022-12-29: 40 meq via ORAL
  Filled 2022-12-29: qty 2

## 2022-12-29 MED ORDER — SODIUM CHLORIDE 0.9 % IV SOLN: Freq: Once | INTRAVENOUS | Status: AC

## 2022-12-29 MED ORDER — STERILE WATER FOR INJECTION IV SOLN
INTRAVENOUS | Status: DC
  Filled 2022-12-29 (×2): qty 1000

## 2022-12-29 MED ORDER — ONDANSETRON HCL 4 MG/2ML IJ SOLN
4.0000 mg | Freq: Once | INTRAMUSCULAR | Status: AC
  Administered 2022-12-29: 4 mg via INTRAVENOUS
  Filled 2022-12-29: qty 2

## 2022-12-29 MED ORDER — FENTANYL CITRATE PF 50 MCG/ML IJ SOSY
25.0000 ug | PREFILLED_SYRINGE | INTRAMUSCULAR | Status: DC | PRN
  Administered 2022-12-29: 25 ug via INTRAVENOUS
  Filled 2022-12-29: qty 1

## 2022-12-29 MED ORDER — ONDANSETRON HCL 4 MG/2ML IJ SOLN
4.0000 mg | Freq: Four times a day (QID) | INTRAMUSCULAR | Status: DC | PRN
  Administered 2022-12-29 – 2023-01-01 (×5): 4 mg via INTRAVENOUS
  Filled 2022-12-29 (×5): qty 2

## 2022-12-29 MED ORDER — LEVOTHYROXINE SODIUM 25 MCG PO TABS
25.0000 ug | ORAL_TABLET | Freq: Every day | ORAL | Status: DC
  Administered 2022-12-30 – 2023-01-03 (×5): 25 ug via ORAL
  Filled 2022-12-29 (×5): qty 1

## 2022-12-29 MED ORDER — APIXABAN 5 MG PO TABS
5.0000 mg | ORAL_TABLET | Freq: Two times a day (BID) | ORAL | Status: DC
  Administered 2022-12-29 – 2023-01-03 (×10): 5 mg via ORAL
  Filled 2022-12-29 (×10): qty 1

## 2022-12-29 MED ORDER — STERILE WATER FOR INJECTION IV SOLN
INTRAVENOUS | Status: DC
  Filled 2022-12-29: qty 1000

## 2022-12-29 MED ORDER — ACETAMINOPHEN 325 MG PO TABS: 650.0000 mg | ORAL_TABLET | Freq: Four times a day (QID) | ORAL | Status: DC | PRN

## 2022-12-29 MED ORDER — POTASSIUM CHLORIDE 10 MEQ/100ML IV SOLN
10.0000 meq | INTRAVENOUS | Status: AC
  Administered 2022-12-29 – 2022-12-30 (×6): 10 meq via INTRAVENOUS
  Filled 2022-12-29 (×6): qty 100

## 2022-12-29 MED ORDER — ENOXAPARIN SODIUM 40 MG/0.4ML IJ SOSY: 40.0000 mg | PREFILLED_SYRINGE | INTRAMUSCULAR | Status: DC

## 2022-12-29 MED ORDER — IOHEXOL 300 MG/ML  SOLN
100.0000 mL | Freq: Once | INTRAMUSCULAR | Status: AC | PRN
  Administered 2022-12-29: 100 mL via INTRAVENOUS

## 2022-12-29 MED ORDER — LACTATED RINGERS IV BOLUS
1000.0000 mL | Freq: Once | INTRAVENOUS | Status: AC
  Administered 2022-12-29: 1000 mL via INTRAVENOUS

## 2022-12-29 MED ORDER — ONDANSETRON HCL 4 MG PO TABS: 4.0000 mg | ORAL_TABLET | Freq: Four times a day (QID) | ORAL | Status: DC | PRN

## 2022-12-29 NOTE — ED Notes (Signed)
Assumed care of pt Started first potassium @ 2002 and Bicarb timed out. Spoke with pharm and pharm is adjusting times.

## 2022-12-29 NOTE — Assessment & Plan Note (Addendum)
Presumed secondary to intractable nausea and vomiting Bicarb gtt. initiated, x 4 after initiation of IV potassium and after potassium recheck has improved Discussed with pharmacy

## 2022-12-29 NOTE — ED Provider Notes (Signed)
[facility] Provider Note    Event Date/Time   First MD Initiated Contact with Patient 12/29/22 1425     (approximate)   History   Abdominal Pain and Emesis   HPI  Molly Brown is a 72 y.o. female with a history of pancreatitis, diverticulitis who presents with complaints of abdominal pain.  She describes diffuse abdominal pain which is moderate to severe, the started 24 hours ago.  Does feel similar to pain that she had before.  Reviewed records and stress patient was admitted for nearly 3 weeks at the beginning of January for pancreatitis.  No history of abdominal surgery.     Physical Exam   Triage Vital Signs: ED Triage Vitals [12/29/22 1415]  Enc Vitals Group     BP (!) 160/99     Pulse Rate (!) 102     Resp 18     Temp 98 F (36.7 C)     Temp Source Oral     SpO2 99 %     Weight      Height      Head Circumference      Peak Flow      Pain Score 8     Pain Loc      Pain Edu?      Excl. in GC?     Most recent vital signs: Vitals:   12/29/22 1415  BP: (!) 160/99  Pulse: (!) 102  Resp: 18  Temp: 98 F (36.7 C)  SpO2: 99%     General: Awake, clearly uncomfortable CV:  Good peripheral perfusion.  Resp:  Normal effort.  Abd:  No distention.  Periumbilical tenderness to palpation, abdomen is soft Other:     ED Results / Procedures / Treatments   Labs (all labs ordered are listed, but only abnormal results are displayed) Labs Reviewed  LIPASE, BLOOD - Abnormal; Notable for the following components:      Result Value   Lipase 708 (*)    All other components within normal limits  COMPREHENSIVE METABOLIC PANEL - Abnormal; Notable for the following components:   Potassium 2.1 (*)    Chloride 120 (*)    CO2 14 (*)    Glucose, Bld 114 (*)    Creatinine, Ser 0.42 (*)    Calcium 6.1 (*)    Total Protein 4.6 (*)    Albumin 2.5 (*)    AST 14 (*)    Alkaline Phosphatase 33 (*)    All other components within normal limits   CBC  URINALYSIS, ROUTINE W REFLEX MICROSCOPIC  BASIC METABOLIC PANEL  CBC  CALCIUM, IONIZED  MAGNESIUM  POTASSIUM  PHOSPHORUS  MAGNESIUM  PHOSPHORUS     EKG  ED ECG REPORT I, Jene Every, the attending physician, personally viewed and interpreted this ECG.  Date: 12/29/2022  Rhythm: normal sinus rhythm QRS Axis: normal Intervals: Nonspecific changes ST/T Wave abnormalities: normal Narrative Interpretation: Normal QT   RADIOLOGY CT abdomen pelvis pending    PROCEDURES:  Critical Care performed: yes  CRITICAL CARE Performed by: Jene Every   Total critical care time: 30 minutes  Critical care time was exclusive of separately billable procedures and treating other patients.  Critical care was necessary to treat or prevent imminent or life-threatening deterioration.  Critical care was time spent personally by me on the following activities: development of treatment plan with patient and/or surrogate as well as nursing, discussions with consultants, evaluation of patient's response to treatment, examination of patient,  obtaining history from patient or surrogate, ordering and performing treatments and interventions, ordering and review of laboratory studies, ordering and review of radiographic studies, pulse oximetry and re-evaluation of patient's condition.   Procedures   MEDICATIONS ORDERED IN ED: Medications  calcium gluconate 1 g/ 50 mL sodium chloride IVPB (1,000 mg Intravenous New Bag/Given 12/29/22 1819)  levothyroxine (SYNTHROID) tablet 25 mcg (has no administration in time range)  acetaminophen (TYLENOL) tablet 650 mg (has no administration in time range)    Or  acetaminophen (TYLENOL) suppository 650 mg (has no administration in time range)  ondansetron (ZOFRAN) tablet 4 mg (has no administration in time range)    Or  ondansetron (ZOFRAN) injection 4 mg (has no administration in time range)  senna-docusate (Senokot-S) tablet 1 tablet (has no  administration in time range)  potassium chloride 10 mEq in 100 mL IVPB (has no administration in time range)  potassium chloride SA (KLOR-CON M) CR tablet 40 mEq (has no administration in time range)  lactated ringers bolus 1,000 mL (has no administration in time range)  morphine (PF) 4 MG/ML injection 4 mg (has no administration in time range)  fentaNYL (SUBLIMAZE) injection 25 mcg (has no administration in time range)  pantoprazole (PROTONIX) EC tablet 80 mg (has no administration in time range)  apixaban (ELIQUIS) tablet 5 mg (has no administration in time range)  metoprolol tartrate (LOPRESSOR) tablet 50 mg (has no administration in time range)  sodium bicarbonate 150 mEq in sterile water 1,150 mL infusion (has no administration in time range)  morphine (PF) 4 MG/ML injection 4 mg (4 mg Intravenous Given 12/29/22 1509)  ondansetron (ZOFRAN) injection 4 mg (4 mg Intravenous Given 12/29/22 1509)  0.9 %  sodium chloride infusion ( Intravenous New Bag/Given 12/29/22 1509)  iohexol (OMNIPAQUE) 300 MG/ML solution 100 mL (100 mLs Intravenous Contrast Given 12/29/22 1632)  morphine (PF) 4 MG/ML injection 4 mg (4 mg Intravenous Given 12/29/22 1707)     IMPRESSION / MDM / ASSESSMENT AND PLAN / ED COURSE  I reviewed the triage vital signs and the nursing notes. Patient's presentation is most consistent with acute presentation with potential threat to life or bodily function.  Patient presents with abdominal pain as detailed above, differential includes pancreatitis, diverticulitis, small bowel obstruction, gastritis.  She appears quite uncomfortable and is having active nausea and vomiting.  Pending labs, will treat with IV morphine, IV Zofran, she will require CT abdomen pelvis  CT scan is mostly consistent with acute on chronic pancreatitis, diverticular abscess is improving.  Notably patient's labs are significant for hypocalcemia and hypokalemia.  Will give IV calcium, IV potassium as the patient is  not tolerating p.o.'s at this time.  I discussed with the hospitalist for admission        FINAL CLINICAL IMPRESSION(S) / ED DIAGNOSES   Final diagnoses:  None     Rx / DC Orders   ED Discharge Orders     None        Note:  This document was prepared using Dragon voice recognition software and may include unintentional dictation errors.   Jene Every, MD 12/29/22 [phone]

## 2022-12-29 NOTE — Assessment & Plan Note (Signed)
-   Levothyroxine 25 mcg daily resumed 

## 2022-12-29 NOTE — Assessment & Plan Note (Addendum)
Apixaban 5 mg p.o. twice daily resumed Metoprolol tartrate 50 mg p.o. twice daily resumed

## 2022-12-29 NOTE — Assessment & Plan Note (Signed)
Check stat phosphorus level on admission, message nursing via urgent secure chat

## 2022-12-29 NOTE — ED Notes (Signed)
Attempted to place a second IV (right AC) without success

## 2022-12-29 NOTE — Assessment & Plan Note (Addendum)
Multiple electrolyte abnormalities Check stat magnesium level and phosphorus Discontinued potassium chloride 10 mill equivalent IV one-time dose ordered by EDP Reordered potassium chloride 10 mill equivalent, 6 doses ordered and potassium chloride 40 mill equivalents p.o. one-time dose Added sodium bicarb minutes 150 mill equivalent in sterile water Time to repeat potassium level at 2000-hour, will discuss with cross coverage provider for monitoring Repeat magnesium and phosphorus in the morning ordered Admit to telemetry cardiac, inpatient

## 2022-12-29 NOTE — Assessment & Plan Note (Addendum)
Check ionized calcium level, discussed with nurse via urgent secure chat Status post calcium gluconate 1 g IV one-time dose ordered Nephrology, Dr. Thedore Mins consulted on admission who acknowledged the consultation and patient will be seen

## 2022-12-29 NOTE — ED Triage Notes (Signed)
Pt presents to ED with c/o of N/V and ABD pain. Pt states admitted to hospital in the Jan of this year for your pancreatitis and Diverticulitis. Pt states she has been eating strawberries.   Pt denies fevers or chills.

## 2022-12-29 NOTE — ED Notes (Signed)
Pt vomited large amount after po potassium. The pt was given zofran iv and fentanyl for pain. The pt was placed on 2 liters of O2 via nasal cannula due to the pt's pulse ox was 90 to 92 percent on room air.

## 2022-12-29 NOTE — Assessment & Plan Note (Signed)
Symptomatic support: Morphine 4 mg IV every 4 hours as needed for moderate pain, 15 hours ordered; fentanyl 25 mcg IV every 3 hours as needed for severe pain, 15 hours ordered LR 1 L bolus ordered on admission Admit to telemetry cardiac, inpatient

## 2022-12-29 NOTE — H&P (Signed)
History and Physical   Molly Brown ZOX:[id number] DOB: [birth year] DOA: 12/29/2022  PCP: Lauro Regulus, MD  Patient coming from: home   I have personally briefly reviewed patient's old medical records in [facility] EMR.  Chief Concern: Abdominal pain, intractable nausea and vomiting  HPI: Molly Brown is a 72 year old female with history of hypertension, GERD, atrial fibrillation on Eliquis, hypertrophic obstructive cardiomyopathy with moderate MR, LVH, dyslipidemia, obstructive sleep apnea, obesity, who presents emergency department for chief concerns of abdominal pain, nausea, vomiting.  Vitals in the ED showed temperature of 98, respiration rate of 18, heart rate of 102, blood pressure 160/99, SpO2 of 99% on room air.  Serum sodium is 139, potassium 2.1, chloride 120, bicarb 14, BUN of 16, serum creatinine of 0.42, nonfasting blood glucose 114, WBC 9.4, hemoglobin 13.6, platelets of 235.  ED treatment: Morphine 4 mg IV, 3 doses ordered.  Potassium chloride 10 mill COVID IV one-time dose.  Calcium gluconate 1 g IV one-time dose. ------------------------ Patient was able to tell me her name, age, location, current calendar year.  Patient is lethargic and in discomfort at bedside.  She appears weak, mucosas are dry.  She reports that she started having abdominal pain, intractable nausea and vomiting yesterday.  She states that since her discharge from the hospital, she is gradually improved her strength and started eating normally again, until last evening.  She reports she had some strawberries yesterday, chicken, corn.  She states her vomitus was mostly food and white sputum.  She denies blood or coffee-ground emesis.  She denies dysuria, hematuria, diarrhea, syncope, loss of consciousness.  She endorses feeling tremulous.  Social history: She lives at home with her husband.  She denies tobacco, EtOH, recreational drug use.  She is retired and formally worked for [employer].  ROS: Constitutional: no weight change, no fever ENT/Mouth: no sore throat, no rhinorrhea Eyes: no eye pain, no vision changes Cardiovascular: no chest pain, no dyspnea,  no edema, no palpitations Respiratory: no cough, no sputum, no wheezing Gastrointestinal: no nausea, no vomiting, no diarrhea, no constipation Genitourinary: + generalized abdominal pain, no urinary incontinence, no dysuria, no hematuria Musculoskeletal: no arthralgias, no myalgias Skin: no skin lesions, no pruritus, Neuro: + weakness, no loss of consciousness, no syncope Psych: no anxiety, no depression, + decrease appetite Heme/Lymph: no bruising, no bleeding  ED Course: Discussed with emergency medicine provider, patient requiring hospitalization for GI concerns of pancreatitis.  Assessment/Plan  Principal Problem:   Hypokalemia Active Problems:   Hypocalcemia   Chronic atrial fibrillation (HCC)   Exertional dyspnea   GERD (gastroesophageal reflux disease)   Hyperlipidemia, mixed   Hypothyroidism   Hyponatremia   Hypophosphatemia   Pancreatitis   Metabolic acidosis   Assessment and Plan:  * Hypokalemia Multiple electrolyte abnormalities Check stat magnesium level and phosphorus Discontinued potassium chloride 10 mill equivalent IV one-time dose ordered by EDP Reordered potassium chloride 10 mill equivalent, 6 doses ordered and potassium chloride 40 mill equivalents p.o. one-time dose Added sodium bicarb minutes 150 mill equivalent in sterile water Time to repeat potassium level at 2000-hour, will discuss with cross coverage provider for monitoring Repeat magnesium and phosphorus in the morning ordered Admit to telemetry cardiac, inpatient  Hypocalcemia Check ionized calcium level, discussed with nurse via urgent secure chat Status post calcium gluconate 1 g IV one-time dose ordered Nephrology, Dr. Thedore Mins consulted on admission who acknowledged the consultation and patient will be  seen  Metabolic acidosis Presumed secondary to intractable  nausea and vomiting Bicarb gtt. initiated, x 4 after initiation of IV potassium and after potassium recheck has improved Discussed with pharmacy  Pancreatitis Symptomatic support: Morphine 4 mg IV every 4 hours as needed for moderate pain, 15 hours ordered; fentanyl 25 mcg IV every 3 hours as needed for severe pain, 15 hours ordered LR 1 L bolus ordered on admission Admit to telemetry cardiac, inpatient  Hypophosphatemia Check stat phosphorus level on admission, message nursing via urgent secure chat  Hypothyroidism Levothyroxine 25 mcg daily resumed  Chronic atrial fibrillation (HCC) Apixaban 5 mg p.o. twice daily resumed Metoprolol tartrate 50 mg p.o. twice daily resumed  Chart reviewed.   DVT prophylaxis: Eliquis Code Status: Full code Diet: NPO; with orders to advance to clear liquids as tolerated Family Communication: Updated husband at bedside with patient's permission Disposition Plan: Pending clinical course Consults called: Nephrology Admission status: Inpatient, telemetry cardiac  Past Medical History:  Diagnosis Date   AF (atrial fibrillation) (HCC)    Anal fissure    Anxiety    Cardiac arrhythmia due to congenital heart disease    Chicken pox    Depression    GERD (gastroesophageal reflux disease)    Heart murmur    Past Surgical History:  Procedure Laterality Date   ABDOMINAL HYSTERECTOMY     AUGMENTATION MAMMAPLASTY Bilateral 1999   BREAST ENHANCEMENT SURGERY  2000   TONSILLECTOMY  1972   TOOTH EXTRACTION     Social History:  reports that she has quit smoking. Her smoking use included cigarettes. She has never used smokeless tobacco. She reports that she does not drink alcohol and does not use drugs.  Allergies  Allergen Reactions   Eszopiclone Other (See Comments)    Felt really weird   Flecainide Other (See Comments) and Rash    Insomnia and felt drugged   Family History  Problem  Relation Age of Onset   Arthritis Mother    Heart disease Father    Arthritis Maternal Grandmother    Arthritis Paternal Grandmother    Heart disease Paternal Grandmother    Breast cancer Neg Hx    Family history: Family history reviewed and not pertinent  Prior to Admission medications   Medication Sig Start Date End Date Taking? Authorizing Provider  acetaminophen (TYLENOL) 325 MG tablet Take 650 mg by mouth every 6 (six) hours as needed.    [provider]  apixaban (ELIQUIS) 5 MG TABS tablet Take 1 tablet (5 mg total) by mouth 2 (two) times daily. 10/10/19   Enedina Finner, MD  cyanocobalamin 500 MCG tablet Take 500 mcg by mouth daily.    [provider]  diltiazem (CARDIZEM CD) 180 MG 24 hr capsule Take 1 capsule (180 mg total) by mouth daily. 10/06/22 01/04/23  Leeroy Bock, MD  levothyroxine (SYNTHROID) 25 MCG tablet Take 1 tablet (25 mcg total) by mouth daily at 6 (six) AM. 10/11/19   Enedina Finner, MD  metoprolol tartrate (LOPRESSOR) 50 MG tablet Take 1 tablet (50 mg total) by mouth 2 (two) times daily. 10/10/19   Enedina Finner, MD  Multiple Vitamin (MULTIVITAMIN) LIQD Take 5 mLs by mouth daily.    [provider]  omeprazole (PRILOSEC) 40 MG capsule TAKE 1 CAPSULE (40 MG TOTAL) BY MOUTH DAILY. 01/11/16   Rachael Fee, MD  polyethylene glycol [facility] Ethelene Hal) packet Take 17 g by mouth as needed.    [provider]  tacrolimus (PROTOPIC) 0.1 % ointment APPLY TO AFFECTED AREA(S) TWICE A DAY  AS DIRECTED 06/03/21   [provider]   Physical Exam: Vitals:   12/29/22 1415  BP: (!) 160/99  Pulse: (!) 102  Resp: 18  Temp: 98 F (36.7 C)  TempSrc: Oral  SpO2: 99%   Constitutional: appears lethargic, acute abdominal pain, uncomfortable Eyes: PERRL, lids and conjunctivae normal ENMT: Mucous membranes are dry. Posterior pharynx clear of any exudate or lesions. Age-appropriate dentition. Hearing appropriate Neck: normal, supple, no masses,  no thyromegaly Respiratory: clear to auscultation bilaterally, no wheezing, no crackles. Normal respiratory effort. No accessory muscle use.  Cardiovascular: Regular rate and rhythm, no murmurs / rubs / gallops. No extremity edema. 2+ pedal pulses. No carotid bruits.  Abdomen: Generalized abdominal tenderness, no masses palpated, no hepatosplenomegaly. Bowel sounds positive.  Musculoskeletal: no clubbing / cyanosis. No joint deformity upper and lower extremities. Good ROM, no contractures, no atrophy. Normal muscle tone.  Skin: no rashes, lesions, ulcers. No induration Neurologic: Sensation intact. Strength 5/5 in all 4.  Psychiatric: Unable to assess judgment and insight. Alert and oriented x 3. Normal mood.   EKG: ordered stat, message nursing via urgent secure chat  Chest x-ray on Admission: I personally reviewed and I agree with radiologist reading as below.  CT ABDOMEN PELVIS W CONTRAST  Result Date: 12/29/2022 CLINICAL DATA:  Nausea vomiting and abdominal pain prior history of pancreatitis and diverticulitis EXAM: CT ABDOMEN AND PELVIS WITH CONTRAST TECHNIQUE: Multidetector CT imaging of the abdomen and pelvis was performed using the standard protocol following bolus administration of intravenous contrast. RADIATION DOSE REDUCTION: This exam was performed according to the departmental dose-optimization program which includes automated exposure control, adjustment of the mA and/or kV according to patient size and/or use of iterative reconstruction technique. CONTRAST:  OMNIPAQUE IOHEXOL 300 MG/ML  SOLN COMPARISON:  CT 09/26/2022 and older FINDINGS: Lower chest: Breast implants are identified at the edge of the imaging field. The heart enlarged. Also component of left ventricular hypertrophy. There is some linear opacity at the bases likely scar or atelectasis. No pleural effusion. Hepatobiliary: No space-occupying liver lesion. Gallbladder is distended. Main portal vein is patent. There is  some irregularity of the portal vein near the portal venous confluence. Some nonocclusive thrombus is suggested. This is new from previous. Pancreas: Extensive peripancreatic fat stranding and ill-defined fluid with phlegmonous areas. This maturation from the prior examination. However there is increasing fluid and stranding extending along the anterior perinephric spaces bilaterally extending into the right pericolic gutter right-greater-than-left. The pancreas itself has some mixed areas of enhancement along the midbody. No rim enhancing fluid collections or soft tissue gas at this time. Spleen: Spleen is nonenlarged.  Splenic vein is patent. Adrenals/Urinary Tract: Adrenal glands are preserved. No enhancing renal mass or collecting system dilatation. Bosniak 1 bilateral renal cysts are stable, no additional follow-up. Preserved contours of the urinary bladder. Stomach/Bowel: Scattered colonic diverticula along left side of the colon. The large bowel is nondilated. Minimal right-sided diverticula. The previous abscess along the distal descending, proximal sigmoid colon has resolved. Stranding is markedly improved. Small residual area on series 2, image 68 measures 10 mm. Normal appendix. Fluid in the stomach. There are some areas of wall thickening identified along the second and third portion of the duodenal adjacent to the inflammatory changes related to the patient's pancreatitis. These loops are nondilated. The small bowel is nondilated overall. Vascular/Lymphatic: Aortic atherosclerosis. No enlarged abdominal or pelvic lymph nodes. Reproductive: Status post hysterectomy. No adnexal masses. Other: Anasarca. There is some free fluid dependently  in the pelvis, increased from previous. Musculoskeletal: Degenerative changes seen along the spine. Mild degenerative changes of the pelvis. IMPRESSION: Maturing changes of previous pancreatitis however there is some increasing fluid tracking into the adjacent tissues,  anterior perinephric spaces and pericolic gutters. This could be an acute on chronic component. The pancreas itself has some heterogeneous enhancement but no gas. Evolving potential areas of pancreatic necrosis are possible. New subtle area of filling defect and irregularity along the portal vein near the portal venous confluence. Nonocclusive thrombus is possible. Improving abscess anterior to the distal descending, proximal sigmoid colon. 10 mm area of residual fluid and soft tissue in this location. No new changes of acute diverticulitis. Electronically Signed   By: Karen Kays M.D.   On: 12/29/2022 17:02    Labs on Admission: I have personally reviewed following labs  CBC: Recent Labs  Lab 12/29/22 1508  WBC 9.4  HGB 13.6  HCT 40.6  MCV 86.0  PLT 235   Basic Metabolic Panel: Recent Labs  Lab 12/29/22 1508  NA 139  K 2.1*  CL 120*  CO2 14*  GLUCOSE 114*  BUN 16  CREATININE 0.42*  CALCIUM 6.1*   GFR: CrCl cannot be calculated (Unknown ideal weight.).  Liver Function Tests: Recent Labs  Lab 12/29/22 1508  AST 14*  ALT 9  ALKPHOS 33*  BILITOT 0.8  PROT 4.6*  ALBUMIN 2.5*   Recent Labs  Lab 12/29/22 1508  LIPASE 708*   Urine analysis:    Component Value Date/Time   COLORURINE YELLOW (A) 09/15/2022 0544   APPEARANCEUR HAZY (A) 09/15/2022 0544   LABSPEC 1.031 (H) 09/15/2022 0544   PHURINE 5.0 09/15/2022 0544   GLUCOSEU NEGATIVE 09/15/2022 0544   HGBUR NEGATIVE 09/15/2022 0544   BILIRUBINUR NEGATIVE 09/15/2022 0544   KETONESUR 20 (A) 09/15/2022 0544   PROTEINUR 100 (A) 09/15/2022 0544   NITRITE NEGATIVE 09/15/2022 0544   LEUKOCYTESUR MODERATE (A) 09/15/2022 0544   CRITICAL CARE Performed by: Dr. Sedalia Muta  Total critical care time: 35 minutes  Critical care time was exclusive of separately billable procedures and treating other patients.  Critical care was necessary to treat or prevent imminent or life-threatening deterioration.  Critical care was time  spent personally by me on the following activities: development of treatment plan with patient and/or surrogate as well as nursing, discussions with consultants, evaluation of patient's response to treatment, examination of patient, obtaining history from patient or surrogate, ordering and performing treatments and interventions, ordering and review of laboratory studies, ordering and review of radiographic studies, pulse oximetry and re-evaluation of patient's condition.  This document was prepared using Dragon Voice Recognition software and may include unintentional dictation errors.  Dr. Sedalia Muta [facility]  If 7PM-7AM, please contact overnight-coverage provider If 7AM-7PM, please contact day attending provider [email or web address]  12/29/2022, 6:22 PM

## 2022-12-29 NOTE — Hospital Course (Addendum)
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  Vitals in the ED showed temperature of 98, respiration rate of 18, heart rate of 102, blood pressure 160/99, SpO2 of 99% on room air.  Serum sodium is 139, potassium 2.1, chloride 120, bicarb 14, BUN of 16, serum creatinine of 0.42, nonfasting blood glucose 114, WBC 9.4, hemoglobin 13.6, platelets of 235.  ED treatment: Morphine 4 mg IV, 3 doses ordered.  Potassium chloride 10 mEq IV one-time dose.  Calcium gluconate 1 g IV one-time dose.

## 2022-12-29 NOTE — ED Notes (Signed)
Dr. Cyril Loosen aware of Calcium and Potassium results.

## 2022-12-30 DIAGNOSIS — I482 Chronic atrial fibrillation, unspecified: Secondary | ICD-10-CM

## 2022-12-30 DIAGNOSIS — E876 Hypokalemia: Secondary | ICD-10-CM | POA: Diagnosis not present

## 2022-12-30 DIAGNOSIS — K8500 Idiopathic acute pancreatitis without necrosis or infection: Secondary | ICD-10-CM | POA: Diagnosis not present

## 2022-12-30 LAB — CBC
HCT: 40.7 % (ref 36.0–46.0)
Hemoglobin: 13.3 g/dL (ref 12.0–15.0)
MCH: 29 pg (ref 26.0–34.0)
MCHC: 32.7 g/dL (ref 30.0–36.0)
MCV: 88.7 fL (ref 80.0–100.0)
Platelets: 214 10*3/uL (ref 150–400)
RBC: 4.59 MIL/uL (ref 3.87–5.11)
RDW: 13.2 % (ref 11.5–15.5)
WBC: 12.9 10*3/uL — ABNORMAL HIGH (ref 4.0–10.5)
nRBC: 0 % (ref 0.0–0.2)

## 2022-12-30 LAB — BASIC METABOLIC PANEL
Anion gap: 5 (ref 5–15)
BUN: 16 mg/dL (ref 8–23)
CO2: 28 mmol/L (ref 22–32)
Calcium: 8.7 mg/dL — ABNORMAL LOW (ref 8.9–10.3)
Chloride: 101 mmol/L (ref 98–111)
Creatinine, Ser: 0.62 mg/dL (ref 0.44–1.00)
GFR, Estimated: >60 mL/min (ref >60)
Glucose, Bld: 129 mg/dL — ABNORMAL HIGH (ref 70–99)
Potassium: 4.6 mmol/L (ref 3.5–5.1)
Sodium: 134 mmol/L — ABNORMAL LOW (ref 135–145)

## 2022-12-30 LAB — PHOSPHORUS: Phosphorus: 3.5 mg/dL (ref 2.5–4.6)

## 2022-12-30 LAB — POTASSIUM: Potassium: 4.6 mmol/L (ref 3.5–5.1)

## 2022-12-30 LAB — MAGNESIUM: Magnesium: 1.7 mg/dL (ref 1.7–2.4)

## 2022-12-30 MED ORDER — MORPHINE SULFATE (PF) 4 MG/ML IV SOLN
4.0000 mg | INTRAVENOUS | Status: DC | PRN
  Administered 2022-12-30 (×2): 4 mg via INTRAVENOUS
  Filled 2022-12-30 (×2): qty 1

## 2022-12-30 MED ORDER — OXYCODONE-ACETAMINOPHEN 7.5-325 MG PO TABS
1.0000 | ORAL_TABLET | ORAL | Status: DC | PRN
  Administered 2022-12-30 – 2023-01-02 (×9): 1 via ORAL
  Filled 2022-12-30 (×9): qty 1

## 2022-12-30 MED ORDER — SODIUM CHLORIDE 0.9 % IV SOLN: INTRAVENOUS | Status: DC

## 2022-12-30 MED ORDER — MORPHINE SULFATE (PF) 4 MG/ML IV SOLN: 4.0000 mg | INTRAVENOUS | Status: DC | PRN

## 2022-12-30 NOTE — Progress Notes (Signed)
PROGRESS NOTE    Molly Brown  WUJ:[id number] DOB: Jul 12, 1951 DOA: 12/29/2022 PCP: Lauro Regulus, MD   Assessment & Plan:   Principal Problem:   Hypokalemia Active Problems:   Hypocalcemia   Chronic atrial fibrillation (HCC)   Exertional dyspnea   GERD (gastroesophageal reflux disease)   Hyperlipidemia, mixed   Hypothyroidism   Hyponatremia   Hypophosphatemia   Pancreatitis   Metabolic acidosis  Assessment and Plan: Pancreatitis: continue on IVFs. Started CLD today. Lipase is elevated. Morphine, percocet prn for pain. Zofran prn for nausea/vomiting   Hypokalemia: WNL today    Hypocalcemia: improved   Metabolic acidosis: resolved   Leukocytosis: likely secondary to pancreatitis    Hypophosphatemia: WNL today    Hypothyroidism: continue on levothyroxine    Chronic a. fib: continue on metoprolol, eliquis          DVT prophylaxis: eliquis Code Status: full  Family Communication:  Disposition Plan: likely d/c back home   Level of care: Telemetry Cardiac  Status is: Inpatient Remains inpatient appropriate because: severity of illness    Consultants:    Procedures:   Antimicrobials:    Subjective: Pt c/o nausea   Objective: Vitals:   12/30/22 0357 12/30/22 0400 12/30/22 0500 12/30/22 0700  BP:  135/84 113/82 124/77  Pulse:  (!) 125 (!) 121 (!) 120  Resp:  (!) 21 (!) 21 16  Temp: 98.1 F (36.7 C)     TempSrc: Oral     SpO2:  93% 95% 97%    Intake/Output Summary (Last 24 hours) at 12/30/2022 9562 Last data filed at 12/29/2022 1940 Gross per 24 hour  Intake 1050 ml  Output --  Net 1050 ml   There were no vitals filed for this visit.  Examination:  General exam: Appears calm and comfortable  Respiratory system: Clear to auscultation. Respiratory effort normal. Cardiovascular system: S1 & S2+. No rubs, gallops or clicks.  Gastrointestinal system: Abdomen is nondistended, soft and tenderness to palpation. hypoactive bowel sounds  heard. Central nervous system: Alert and oriented. Moves all extremities. Psychiatry: Judgement and insight appear normal. Flat mood and affect    Data Reviewed: I have personally reviewed following labs and imaging studies  CBC: Recent Labs  Lab 12/29/22 1508 12/30/22 0355  WBC 9.4 12.9*  HGB 13.6 13.3  HCT 40.6 40.7  MCV 86.0 88.7  PLT 235 214   Basic Metabolic Panel: Recent Labs  Lab 12/29/22 1508 12/29/22 1727 12/30/22 0355  NA 139  --  134*  K 2.1*  --  4.6  4.6  CL 120*  --  101  CO2 14*  --  28  GLUCOSE 114*  --  129*  BUN 16  --  16  CREATININE 0.42*  --  0.62  CALCIUM 6.1*  --  8.7*  MG  --  1.8 1.7  PHOS  --  3.1 3.5   GFR: CrCl cannot be calculated (Unknown ideal weight.). Liver Function Tests: Recent Labs  Lab 12/29/22 1508  AST 14*  ALT 9  ALKPHOS 33*  BILITOT 0.8  PROT 4.6*  ALBUMIN 2.5*   Recent Labs  Lab 12/29/22 1508  LIPASE 708*   No results for input(s): "AMMONIA" in the last 168 hours. Coagulation Profile: No results for input(s): "INR", "PROTIME" in the last 168 hours. Cardiac Enzymes: No results for input(s): "CKTOTAL", "CKMB", "CKMBINDEX", "TROPONINI" in the last 168 hours. BNP (last 3 results) No results for input(s): "PROBNP" in the last 8760 hours. HbA1C: No results for  input(s): "HGBA1C" in the last 72 hours. CBG: No results for input(s): "GLUCAP" in the last 168 hours. Lipid Profile: No results for input(s): "CHOL", "HDL", "LDLCALC", "TRIG", "CHOLHDL", "LDLDIRECT" in the last 72 hours. Thyroid Function Tests: No results for input(s): "TSH", "T4TOTAL", "FREET4", "T3FREE", "THYROIDAB" in the last 72 hours. Anemia Panel: No results for input(s): "VITAMINB12", "FOLATE", "FERRITIN", "TIBC", "IRON", "RETICCTPCT" in the last 72 hours. Sepsis Labs: No results for input(s): "PROCALCITON", "LATICACIDVEN" in the last 168 hours.  No results found for this or any previous visit (from the past 240 hour(s)).       Radiology  Studies: DG Chest Port 1 View  Result Date: 12/29/2022 CLINICAL DATA:  Abdominal pain, shortness of breath, intractable nausea and vomiting EXAM: PORTABLE CHEST 1 VIEW COMPARISON:  Portable exam 1807 hours compared to 09/23/2022 FINDINGS: Upper normal heart size. Stable mediastinal contours. Diffuse interstitial infiltrates in the mid to lower lungs, question pulmonary edema versus atypical infection. No pleural effusion or pneumothorax. Osseous structures unremarkable. IMPRESSION: Diffuse interstitial infiltrates of the mid to lower lungs question pulmonary edema versus atypical infection. Electronically Signed   By: Ulyses Southward M.D.   On: 12/29/2022 18:23   CT ABDOMEN PELVIS W CONTRAST  Result Date: 12/29/2022 CLINICAL DATA:  Nausea vomiting and abdominal pain prior history of pancreatitis and diverticulitis EXAM: CT ABDOMEN AND PELVIS WITH CONTRAST TECHNIQUE: Multidetector CT imaging of the abdomen and pelvis was performed using the standard protocol following bolus administration of intravenous contrast. RADIATION DOSE REDUCTION: This exam was performed according to the departmental dose-optimization program which includes automated exposure control, adjustment of the mA and/or kV according to patient size and/or use of iterative reconstruction technique. CONTRAST:  OMNIPAQUE IOHEXOL 300 MG/ML  SOLN COMPARISON:  CT 09/26/2022 and older FINDINGS: Lower chest: Breast implants are identified at the edge of the imaging field. The heart enlarged. Also component of left ventricular hypertrophy. There is some linear opacity at the bases likely scar or atelectasis. No pleural effusion. Hepatobiliary: No space-occupying liver lesion. Gallbladder is distended. Main portal vein is patent. There is some irregularity of the portal vein near the portal venous confluence. Some nonocclusive thrombus is suggested. This is new from previous. Pancreas: Extensive peripancreatic fat stranding and ill-defined fluid with  phlegmonous areas. This maturation from the prior examination. However there is increasing fluid and stranding extending along the anterior perinephric spaces bilaterally extending into the right pericolic gutter right-greater-than-left. The pancreas itself has some mixed areas of enhancement along the midbody. No rim enhancing fluid collections or soft tissue gas at this time. Spleen: Spleen is nonenlarged.  Splenic vein is patent. Adrenals/Urinary Tract: Adrenal glands are preserved. No enhancing renal mass or collecting system dilatation. Bosniak 1 bilateral renal cysts are stable, no additional follow-up. Preserved contours of the urinary bladder. Stomach/Bowel: Scattered colonic diverticula along left side of the colon. The large bowel is nondilated. Minimal right-sided diverticula. The previous abscess along the distal descending, proximal sigmoid colon has resolved. Stranding is markedly improved. Small residual area on series 2, image 68 measures 10 mm. Normal appendix. Fluid in the stomach. There are some areas of wall thickening identified along the second and third portion of the duodenal adjacent to the inflammatory changes related to the patient's pancreatitis. These loops are nondilated. The small bowel is nondilated overall. Vascular/Lymphatic: Aortic atherosclerosis. No enlarged abdominal or pelvic lymph nodes. Reproductive: Status post hysterectomy. No adnexal masses. Other: Anasarca. There is some free fluid dependently in the pelvis, increased from previous. Musculoskeletal:  Degenerative changes seen along the spine. Mild degenerative changes of the pelvis. IMPRESSION: Maturing changes of previous pancreatitis however there is some increasing fluid tracking into the adjacent tissues, anterior perinephric spaces and pericolic gutters. This could be an acute on chronic component. The pancreas itself has some heterogeneous enhancement but no gas. Evolving potential areas of pancreatic necrosis are  possible. New subtle area of filling defect and irregularity along the portal vein near the portal venous confluence. Nonocclusive thrombus is possible. Improving abscess anterior to the distal descending, proximal sigmoid colon. 10 mm area of residual fluid and soft tissue in this location. No new changes of acute diverticulitis. Electronically Signed   By: Karen Kays M.D.   On: 12/29/2022 17:02        Scheduled Meds:  apixaban  5 mg Oral BID   levothyroxine  25 mcg Oral Q0600   metoprolol tartrate  50 mg Oral BID   pantoprazole  80 mg Oral Daily   Continuous Infusions:  sodium bicarbonate 150 mEq in sterile water 1,150 mL infusion 125 mL/hr at 12/30/22 0611     LOS: 1 day    Time spent: 35 mins     Charise Killian, MD [facility] Pager [phone]  If 7PM-7AM, please contact night-coverage [email or web address] 12/30/2022, 8:23 AM

## 2022-12-30 NOTE — Progress Notes (Signed)
Mobility Specialist - Progress Note     12/30/22 1647  Mobility  Activity Ambulated with assistance in hallway;Dangled on edge of bed;Stood at bedside  Level of Assistance Standby assist, set-up cues, supervision of patient - no hands on  Assistive Device Other (Comment) (IV Pole hold)  Distance Ambulated (ft) 80 ft  Range of Motion/Exercises Active  Activity Response Tolerated well  Mobility Referral Yes  $Mobility charge 1 Mobility   Pt resting in bed on RA upon entry. Pt STS and ambulates to hallway around NS with IV Pole hold for support. Pt returned to bed and left EOB to brush teeth.   Johnathan Hausen Mobility Specialist 12/30/22, 4:51 PM

## 2022-12-30 NOTE — ED Notes (Signed)
Patient sleeping at this time, equal chest rise and fall noted.  Due to patient finally asleep after vomiting, holding synthroid until patient is awake.

## 2022-12-31 DIAGNOSIS — I482 Chronic atrial fibrillation, unspecified: Secondary | ICD-10-CM | POA: Diagnosis not present

## 2022-12-31 DIAGNOSIS — K8500 Idiopathic acute pancreatitis without necrosis or infection: Secondary | ICD-10-CM | POA: Diagnosis not present

## 2022-12-31 DIAGNOSIS — E876 Hypokalemia: Secondary | ICD-10-CM | POA: Diagnosis not present

## 2022-12-31 LAB — BASIC METABOLIC PANEL
Anion gap: 5 (ref 5–15)
BUN: 17 mg/dL (ref 8–23)
CO2: 25 mmol/L (ref 22–32)
Calcium: 8.7 mg/dL — ABNORMAL LOW (ref 8.9–10.3)
Chloride: 103 mmol/L (ref 98–111)
Creatinine, Ser: 0.7 mg/dL (ref 0.44–1.00)
GFR, Estimated: >60 mL/min (ref >60)
Glucose, Bld: 112 mg/dL — ABNORMAL HIGH (ref 70–99)
Potassium: 4.1 mmol/L (ref 3.5–5.1)
Sodium: 133 mmol/L — ABNORMAL LOW (ref 135–145)

## 2022-12-31 LAB — CBC
HCT: 36.7 % (ref 36.0–46.0)
Hemoglobin: 11.9 g/dL — ABNORMAL LOW (ref 12.0–15.0)
MCH: 29.2 pg (ref 26.0–34.0)
MCHC: 32.4 g/dL (ref 30.0–36.0)
MCV: 90 fL (ref 80.0–100.0)
Platelets: 201 10*3/uL (ref 150–400)
RBC: 4.08 MIL/uL (ref 3.87–5.11)
RDW: 13.4 % (ref 11.5–15.5)
WBC: 14.4 10*3/uL — ABNORMAL HIGH (ref 4.0–10.5)
nRBC: 0 % (ref 0.0–0.2)

## 2022-12-31 LAB — LIPASE, BLOOD: Lipase: 80 U/L — ABNORMAL HIGH (ref 11–51)

## 2022-12-31 MED ORDER — DOCUSATE SODIUM 100 MG PO CAPS
200.0000 mg | ORAL_CAPSULE | Freq: Two times a day (BID) | ORAL | Status: DC
  Administered 2022-12-31 – 2023-01-03 (×7): 200 mg via ORAL
  Filled 2022-12-31 (×7): qty 2

## 2022-12-31 NOTE — Progress Notes (Signed)
PROGRESS NOTE    Molly Brown  ZOX:[id number] DOB: 1951-04-19 DOA: 12/29/2022 PCP: Lauro Regulus, MD   Assessment & Plan:   Principal Problem:   Hypokalemia Active Problems:   Hypocalcemia   Chronic atrial fibrillation (HCC)   Exertional dyspnea   GERD (gastroesophageal reflux disease)   Hyperlipidemia, mixed   Hypothyroidism   Hyponatremia   Hypophosphatemia   Pancreatitis   Metabolic acidosis  Assessment and Plan: Likely acute on chronic pancreatitis: continue on IVFs. Continue on CLD. Lipase is still elevated but trending down. Zofran prn nausea/vomiting. Morphine, percocet prn for pain   Hypokalemia: WNL today     Hypocalcemia: improved   Metabolic acidosis: resolved   Leukocytosis: likely secondary to pancreatitis    Hypophosphatemia: WNL today     Hypothyroidism: continue on levothyroxine    Chronic a. fib: continue on eliquis, metoprolol          DVT prophylaxis: eliquis Code Status: full  Family Communication: discussed pt's care w/ pt's family at bedside and answered their questions Disposition Plan: likely d/c back home   Level of care: Telemetry Cardiac  Status is: Inpatient Remains inpatient appropriate because: severity of illness    Consultants:    Procedures:   Antimicrobials:    Subjective: Pt c/o intermittent abd pain & poor appetite   Objective: Vitals:   12/30/22 1939 12/30/22 2327 12/31/22 0331 12/31/22 0752  BP: 121/72 111/71 126/77 113/83  Pulse: (!) 104 (!) 106 (!) 104 (!) 119  Resp: 18 20 16 15   Temp: 98.3 F (36.8 C) 98.5 F (36.9 C) 98.3 F (36.8 C) 98.1 F (36.7 C)  TempSrc:      SpO2: 94% 94% 93% 96%  Weight:      Height:        Intake/Output Summary (Last 24 hours) at 12/31/2022 0835 Last data filed at 12/30/2022 1857 Gross per 24 hour  Intake 691.23 ml  Output --  Net 691.23 ml   Filed Weights   12/30/22 1445 12/30/22 1454  Weight: 90.7 kg 100 kg    Examination:  General exam: Appears  uncomfortable  Respiratory system: clear breath sounds b/l Cardiovascular system: S1/S2+. No rubs or clicks  Gastrointestinal system: abd is soft, tenderness to palpation, obese & hypoactive bowel sounds Central nervous system: alert and oriented. Moves all extremities  Psychiatry: Judgement and insight appears normal. Appropriate mood and affect    Data Reviewed: I have personally reviewed following labs and imaging studies  CBC: Recent Labs  Lab 12/29/22 1508 12/30/22 0355 12/31/22 0427  WBC 9.4 12.9* 14.4*  HGB 13.6 13.3 11.9*  HCT 40.6 40.7 36.7  MCV 86.0 88.7 90.0  PLT 235 214 201   Basic Metabolic Panel: Recent Labs  Lab 12/29/22 1508 12/29/22 1727 12/30/22 0355 12/31/22 0427  NA 139  --  134* 133*  K 2.1*  --  4.6  4.6 4.1  CL 120*  --  101 103  CO2 14*  --  28 25  GLUCOSE 114*  --  129* 112*  BUN 16  --  16 17  CREATININE 0.42*  --  0.62 0.70  CALCIUM 6.1*  --  8.7* 8.7*  MG  --  1.8 1.7  --   PHOS  --  3.1 3.5  --    GFR: Estimated Creatinine Clearance: 82 mL/min (by C-G formula based on SCr of 0.7 mg/dL). Liver Function Tests: Recent Labs  Lab 12/29/22 1508  AST 14*  ALT 9  ALKPHOS 33*  BILITOT  0.8  PROT 4.6*  ALBUMIN 2.5*   Recent Labs  Lab 12/29/22 1508 12/31/22 0427  LIPASE 708* 80*   No results for input(s): "AMMONIA" in the last 168 hours. Coagulation Profile: No results for input(s): "INR", "PROTIME" in the last 168 hours. Cardiac Enzymes: No results for input(s): "CKTOTAL", "CKMB", "CKMBINDEX", "TROPONINI" in the last 168 hours. BNP (last 3 results) No results for input(s): "PROBNP" in the last 8760 hours. HbA1C: No results for input(s): "HGBA1C" in the last 72 hours. CBG: No results for input(s): "GLUCAP" in the last 168 hours. Lipid Profile: No results for input(s): "CHOL", "HDL", "LDLCALC", "TRIG", "CHOLHDL", "LDLDIRECT" in the last 72 hours. Thyroid Function Tests: No results for input(s): "TSH", "T4TOTAL", "FREET4",  "T3FREE", "THYROIDAB" in the last 72 hours. Anemia Panel: No results for input(s): "VITAMINB12", "FOLATE", "FERRITIN", "TIBC", "IRON", "RETICCTPCT" in the last 72 hours. Sepsis Labs: No results for input(s): "PROCALCITON", "LATICACIDVEN" in the last 168 hours.  No results found for this or any previous visit (from the past 240 hour(s)).       Radiology Studies: DG Chest Port 1 View  Result Date: 12/29/2022 CLINICAL DATA:  Abdominal pain, shortness of breath, intractable nausea and vomiting EXAM: PORTABLE CHEST 1 VIEW COMPARISON:  Portable exam 1807 hours compared to 09/23/2022 FINDINGS: Upper normal heart size. Stable mediastinal contours. Diffuse interstitial infiltrates in the mid to lower lungs, question pulmonary edema versus atypical infection. No pleural effusion or pneumothorax. Osseous structures unremarkable. IMPRESSION: Diffuse interstitial infiltrates of the mid to lower lungs question pulmonary edema versus atypical infection. Electronically Signed   By: Ulyses Southward M.D.   On: 12/29/2022 18:23   CT ABDOMEN PELVIS W CONTRAST  Result Date: 12/29/2022 CLINICAL DATA:  Nausea vomiting and abdominal pain prior history of pancreatitis and diverticulitis EXAM: CT ABDOMEN AND PELVIS WITH CONTRAST TECHNIQUE: Multidetector CT imaging of the abdomen and pelvis was performed using the standard protocol following bolus administration of intravenous contrast. RADIATION DOSE REDUCTION: This exam was performed according to the departmental dose-optimization program which includes automated exposure control, adjustment of the mA and/or kV according to patient size and/or use of iterative reconstruction technique. CONTRAST:  OMNIPAQUE IOHEXOL 300 MG/ML  SOLN COMPARISON:  CT 09/26/2022 and older FINDINGS: Lower chest: Breast implants are identified at the edge of the imaging field. The heart enlarged. Also component of left ventricular hypertrophy. There is some linear opacity at the bases likely scar  or atelectasis. No pleural effusion. Hepatobiliary: No space-occupying liver lesion. Gallbladder is distended. Main portal vein is patent. There is some irregularity of the portal vein near the portal venous confluence. Some nonocclusive thrombus is suggested. This is new from previous. Pancreas: Extensive peripancreatic fat stranding and ill-defined fluid with phlegmonous areas. This maturation from the prior examination. However there is increasing fluid and stranding extending along the anterior perinephric spaces bilaterally extending into the right pericolic gutter right-greater-than-left. The pancreas itself has some mixed areas of enhancement along the midbody. No rim enhancing fluid collections or soft tissue gas at this time. Spleen: Spleen is nonenlarged.  Splenic vein is patent. Adrenals/Urinary Tract: Adrenal glands are preserved. No enhancing renal mass or collecting system dilatation. Bosniak 1 bilateral renal cysts are stable, no additional follow-up. Preserved contours of the urinary bladder. Stomach/Bowel: Scattered colonic diverticula along left side of the colon. The large bowel is nondilated. Minimal right-sided diverticula. The previous abscess along the distal descending, proximal sigmoid colon has resolved. Stranding is markedly improved. Small residual area on series 2, image  68 measures 10 mm. Normal appendix. Fluid in the stomach. There are some areas of wall thickening identified along the second and third portion of the duodenal adjacent to the inflammatory changes related to the patient's pancreatitis. These loops are nondilated. The small bowel is nondilated overall. Vascular/Lymphatic: Aortic atherosclerosis. No enlarged abdominal or pelvic lymph nodes. Reproductive: Status post hysterectomy. No adnexal masses. Other: Anasarca. There is some free fluid dependently in the pelvis, increased from previous. Musculoskeletal: Degenerative changes seen along the spine. Mild degenerative  changes of the pelvis. IMPRESSION: Maturing changes of previous pancreatitis however there is some increasing fluid tracking into the adjacent tissues, anterior perinephric spaces and pericolic gutters. This could be an acute on chronic component. The pancreas itself has some heterogeneous enhancement but no gas. Evolving potential areas of pancreatic necrosis are possible. New subtle area of filling defect and irregularity along the portal vein near the portal venous confluence. Nonocclusive thrombus is possible. Improving abscess anterior to the distal descending, proximal sigmoid colon. 10 mm area of residual fluid and soft tissue in this location. No new changes of acute diverticulitis. Electronically Signed   By: Karen Kays M.D.   On: 12/29/2022 17:02        Scheduled Meds:  apixaban  5 mg Oral BID   levothyroxine  25 mcg Oral Q0600   metoprolol tartrate  50 mg Oral BID   pantoprazole  80 mg Oral Daily   Continuous Infusions:  sodium chloride 75 mL/hr at 12/30/22 2238     LOS: 2 days    Time spent: 37 mins     Charise Killian, MD [facility] Pager [phone]  If 7PM-7AM, please contact night-coverage [email or web address] 12/31/2022, 8:35 AM

## 2022-12-31 NOTE — Plan of Care (Signed)

## 2022-12-31 NOTE — Plan of Care (Signed)
  Problem: Education: Goal: Knowledge of General Education information will improve Description: Including pain rating scale, medication(s)/side effects and non-pharmacologic comfort measures 12/31/2022 1800 by Unice Cobble, RN Outcome: Progressing 12/31/2022 1759 by Unice Cobble, RN Outcome: Progressing   Problem: Health Behavior/Discharge Planning: Goal: Ability to manage health-related needs will improve 12/31/2022 1800 by Unice Cobble, RN Outcome: Progressing 12/31/2022 1759 by Unice Cobble, RN Outcome: Progressing   Problem: Clinical Measurements: Goal: Ability to maintain clinical measurements within normal limits will improve 12/31/2022 1800 by Unice Cobble, RN Outcome: Progressing 12/31/2022 1759 by Unice Cobble, RN Outcome: Progressing Goal: Will remain free from infection 12/31/2022 1800 by Unice Cobble, RN Outcome: Progressing 12/31/2022 1759 by Unice Cobble, RN Outcome: Progressing Goal: Diagnostic test results will improve 12/31/2022 1800 by Unice Cobble, RN Outcome: Progressing 12/31/2022 1759 by Unice Cobble, RN Outcome: Progressing Goal: Respiratory complications will improve 12/31/2022 1800 by Unice Cobble, RN Outcome: Progressing 12/31/2022 1759 by Unice Cobble, RN Outcome: Progressing Goal: Cardiovascular complication will be avoided 12/31/2022 1800 by Unice Cobble, RN Outcome: Progressing 12/31/2022 1759 by Unice Cobble, RN Outcome: Progressing   Problem: Activity: Goal: Risk for activity intolerance will decrease 12/31/2022 1800 by Unice Cobble, RN Outcome: Progressing 12/31/2022 1759 by Unice Cobble, RN Outcome: Progressing   Problem: Nutrition: Goal: Adequate nutrition will be maintained 12/31/2022 1800 by Unice Cobble, RN Outcome: Progressing 12/31/2022 1759 by Unice Cobble, RN Outcome: Progressing   Problem: Coping: Goal: Level of anxiety will  decrease 12/31/2022 1800 by Unice Cobble, RN Outcome: Progressing 12/31/2022 1759 by Unice Cobble, RN Outcome: Progressing   Problem: Elimination: Goal: Will not experience complications related to bowel motility 12/31/2022 1800 by Unice Cobble, RN Outcome: Progressing 12/31/2022 1759 by Unice Cobble, RN Outcome: Progressing Goal: Will not experience complications related to urinary retention 12/31/2022 1800 by Unice Cobble, RN Outcome: Progressing 12/31/2022 1759 by Unice Cobble, RN Outcome: Progressing   Problem: Pain Managment: Goal: General experience of comfort will improve 12/31/2022 1800 by Unice Cobble, RN Outcome: Progressing 12/31/2022 1759 by Unice Cobble, RN Outcome: Progressing   Problem: Safety: Goal: Ability to remain free from injury will improve 12/31/2022 1800 by Unice Cobble, RN Outcome: Progressing 12/31/2022 1759 by Unice Cobble, RN Outcome: Progressing   Problem: Skin Integrity: Goal: Risk for impaired skin integrity will decrease 12/31/2022 1800 by Unice Cobble, RN Outcome: Progressing 12/31/2022 1759 by Unice Cobble, RN Outcome: Progressing

## 2023-01-01 DIAGNOSIS — D72828 Other elevated white blood cell count: Secondary | ICD-10-CM

## 2023-01-01 DIAGNOSIS — K859 Acute pancreatitis without necrosis or infection, unspecified: Secondary | ICD-10-CM | POA: Diagnosis not present

## 2023-01-01 DIAGNOSIS — I482 Chronic atrial fibrillation, unspecified: Secondary | ICD-10-CM | POA: Diagnosis not present

## 2023-01-01 LAB — CBC
HCT: 37.3 % (ref 36.0–46.0)
Hemoglobin: 11.7 g/dL — ABNORMAL LOW (ref 12.0–15.0)
MCH: 28.8 pg (ref 26.0–34.0)
MCHC: 31.4 g/dL (ref 30.0–36.0)
MCV: 91.9 fL (ref 80.0–100.0)
Platelets: 161 10*3/uL (ref 150–400)
RBC: 4.06 MIL/uL (ref 3.87–5.11)
RDW: 13 % (ref 11.5–15.5)
WBC: 12.5 10*3/uL — ABNORMAL HIGH (ref 4.0–10.5)
nRBC: 0 % (ref 0.0–0.2)

## 2023-01-01 LAB — LIPASE, BLOOD: Lipase: 28 U/L (ref 11–51)

## 2023-01-01 LAB — BASIC METABOLIC PANEL
Anion gap: 6 (ref 5–15)
BUN: 16 mg/dL (ref 8–23)
CO2: 24 mmol/L (ref 22–32)
Calcium: 8.1 mg/dL — ABNORMAL LOW (ref 8.9–10.3)
Chloride: 102 mmol/L (ref 98–111)
Creatinine, Ser: 0.67 mg/dL (ref 0.44–1.00)
GFR, Estimated: >60 mL/min (ref >60)
Glucose, Bld: 90 mg/dL (ref 70–99)
Potassium: 3.9 mmol/L (ref 3.5–5.1)
Sodium: 132 mmol/L — ABNORMAL LOW (ref 135–145)

## 2023-01-01 LAB — CALCIUM, IONIZED: Calcium, Ionized, Serum: 5.1 mg/dL (ref 4.5–5.6)

## 2023-01-01 MED ORDER — DILTIAZEM HCL 25 MG/5ML IV SOLN
10.0000 mg | INTRAVENOUS | Status: AC
  Administered 2023-01-01: 10 mg via INTRAVENOUS
  Filled 2023-01-01: qty 5

## 2023-01-01 MED ORDER — POLYETHYLENE GLYCOL 3350 17 G PO PACK
17.0000 g | PACK | Freq: Every day | ORAL | Status: DC
  Administered 2023-01-01 – 2023-01-03 (×3): 17 g via ORAL
  Filled 2023-01-01 (×3): qty 1

## 2023-01-01 NOTE — Plan of Care (Signed)

## 2023-01-01 NOTE — Progress Notes (Signed)
PROGRESS NOTE    Molly Brown  UEA:[id number] DOB: 06-Apr-1951 DOA: 12/29/2022 PCP: Lauro Regulus, MD   Assessment & Plan:   Principal Problem:   Hypokalemia Active Problems:   Hypocalcemia   Chronic atrial fibrillation (HCC)   Exertional dyspnea   GERD (gastroesophageal reflux disease)   Hyperlipidemia, mixed   Hypothyroidism   Hyponatremia   Hypophosphatemia   Pancreatitis   Metabolic acidosis  Assessment and Plan: Likely acute on chronic pancreatitis: continue on IVFs. Lipase is WNL today. Advanced to soft diet today. Zofran prn nausea/vomiting. Percocet, morphine prn for pain.  Hypokalemia: WNL today    Hypocalcemia: improved   Metabolic acidosis: resolved   Leukocytosis: likely secondary to pancreatitis    Hypophosphatemia: WNL today     Hypothyroidism: continue on levothyroxine    Chronic a. fib: continue on metoprolol, eliquis          DVT prophylaxis: eliquis Code Status: full  Family Communication: discussed pt's care w/ pt's family at bedside and answered their questions Disposition Plan: likely d/c back home   Level of care: Telemetry Cardiac  Status is: Inpatient Remains inpatient appropriate because: severity of illness    Consultants:    Procedures:   Antimicrobials:    Subjective: Pt c/o intermittent nausea   Objective: Vitals:   12/31/22 2333 01/01/23 0336 01/01/23 0555 01/01/23 0741  BP: 136/84 121/72  119/73  Pulse: (!) 110 (!) 108  98  Resp: 20 18  14   Temp: 98.4 F (36.9 C) 98.9 F (37.2 C)  98 F (36.7 C)  TempSrc: Oral   Oral  SpO2: 94% 92%  95%  Weight:   102.3 kg   Height:        Intake/Output Summary (Last 24 hours) at 01/01/2023 0801 Last data filed at 01/01/2023 0316 Gross per 24 hour  Intake 2721.85 ml  Output --  Net 2721.85 ml   Filed Weights   12/30/22 1445 12/30/22 1454 01/01/23 0555  Weight: 90.7 kg 100 kg 102.3 kg    Examination:  General exam: Appears lethargic  Respiratory system:  clear breath sounds b/l  Cardiovascular system: S1 & S2+. No rubs or clicks  Gastrointestinal system: abd is soft, NT, obese & hypoactive bowel sounds  Central nervous system: lethargic. Moves all extremities  Psychiatry: Flat mood and affect    Data Reviewed: I have personally reviewed following labs and imaging studies  CBC: Recent Labs  Lab 12/29/22 1508 12/30/22 0355 12/31/22 0427 01/01/23 0524  WBC 9.4 12.9* 14.4* 12.5*  HGB 13.6 13.3 11.9* 11.7*  HCT 40.6 40.7 36.7 37.3  MCV 86.0 88.7 90.0 91.9  PLT 235 214 201 161   Basic Metabolic Panel: Recent Labs  Lab 12/29/22 1508 12/29/22 1727 12/30/22 0355 12/31/22 0427 01/01/23 0524  NA 139  --  134* 133* 132*  K 2.1*  --  4.6  4.6 4.1 3.9  CL 120*  --  101 103 102  CO2 14*  --  28 25 24   GLUCOSE 114*  --  129* 112* 90  BUN 16  --  16 17 16   CREATININE 0.42*  --  0.62 0.70 0.67  CALCIUM 6.1*  --  8.7* 8.7* 8.1*  MG  --  1.8 1.7  --   --   PHOS  --  3.1 3.5  --   --    GFR: Estimated Creatinine Clearance: 83.1 mL/min (by C-G formula based on SCr of 0.67 mg/dL). Liver Function Tests: Recent Labs  Lab 12/29/22 1508  AST 14*  ALT 9  ALKPHOS 33*  BILITOT 0.8  PROT 4.6*  ALBUMIN 2.5*   Recent Labs  Lab 12/29/22 1508 12/31/22 0427 01/01/23 0524  LIPASE 708* 80* 28   No results for input(s): "AMMONIA" in the last 168 hours. Coagulation Profile: No results for input(s): "INR", "PROTIME" in the last 168 hours. Cardiac Enzymes: No results for input(s): "CKTOTAL", "CKMB", "CKMBINDEX", "TROPONINI" in the last 168 hours. BNP (last 3 results) No results for input(s): "PROBNP" in the last 8760 hours. HbA1C: No results for input(s): "HGBA1C" in the last 72 hours. CBG: No results for input(s): "GLUCAP" in the last 168 hours. Lipid Profile: No results for input(s): "CHOL", "HDL", "LDLCALC", "TRIG", "CHOLHDL", "LDLDIRECT" in the last 72 hours. Thyroid Function Tests: No results for input(s): "TSH", "T4TOTAL",  "FREET4", "T3FREE", "THYROIDAB" in the last 72 hours. Anemia Panel: No results for input(s): "VITAMINB12", "FOLATE", "FERRITIN", "TIBC", "IRON", "RETICCTPCT" in the last 72 hours. Sepsis Labs: No results for input(s): "PROCALCITON", "LATICACIDVEN" in the last 168 hours.  No results found for this or any previous visit (from the past 240 hour(s)).       Radiology Studies: No results found.      Scheduled Meds:  apixaban  5 mg Oral BID   docusate sodium  200 mg Oral BID   levothyroxine  25 mcg Oral Q0600   metoprolol tartrate  50 mg Oral BID   pantoprazole  80 mg Oral Daily   Continuous Infusions:  sodium chloride 75 mL/hr at 01/01/23 0316     LOS: 3 days    Time spent: 30 mins     Charise Killian, MD [facility] Pager [phone]  If 7PM-7AM, please contact night-coverage [email or web address] 01/01/2023, 8:01 AM

## 2023-01-02 DIAGNOSIS — I482 Chronic atrial fibrillation, unspecified: Secondary | ICD-10-CM | POA: Diagnosis not present

## 2023-01-02 DIAGNOSIS — D72828 Other elevated white blood cell count: Secondary | ICD-10-CM | POA: Diagnosis not present

## 2023-01-02 DIAGNOSIS — K859 Acute pancreatitis without necrosis or infection, unspecified: Secondary | ICD-10-CM | POA: Diagnosis not present

## 2023-01-02 LAB — BASIC METABOLIC PANEL
Anion gap: 11 (ref 5–15)
BUN: 14 mg/dL (ref 8–23)
CO2: 21 mmol/L — ABNORMAL LOW (ref 22–32)
Calcium: 8.5 mg/dL — ABNORMAL LOW (ref 8.9–10.3)
Chloride: 100 mmol/L (ref 98–111)
Creatinine, Ser: 0.76 mg/dL (ref 0.44–1.00)
GFR, Estimated: >60 mL/min (ref >60)
Glucose, Bld: 78 mg/dL (ref 70–99)
Potassium: 3.8 mmol/L (ref 3.5–5.1)
Sodium: 132 mmol/L — ABNORMAL LOW (ref 135–145)

## 2023-01-02 LAB — CBC
HCT: 36.6 % (ref 36.0–46.0)
Hemoglobin: 11.9 g/dL — ABNORMAL LOW (ref 12.0–15.0)
MCH: 29.5 pg (ref 26.0–34.0)
MCHC: 32.5 g/dL (ref 30.0–36.0)
MCV: 90.6 fL (ref 80.0–100.0)
Platelets: 175 10*3/uL (ref 150–400)
RBC: 4.04 MIL/uL (ref 3.87–5.11)
RDW: 12.8 % (ref 11.5–15.5)
WBC: 11.1 10*3/uL — ABNORMAL HIGH (ref 4.0–10.5)
nRBC: 0 % (ref 0.0–0.2)

## 2023-01-02 NOTE — Care Management Important Message (Signed)
Important Message  Patient Details  Name: Molly Brown MRN: [id number] Date of Birth: Jun 21, 1951   Medicare Important Message Given:  Yes     Johnell Comings 01/02/2023, 12:00 PM

## 2023-01-02 NOTE — Progress Notes (Signed)
PROGRESS NOTE    Molly Brown  ZOX:[id number] DOB: June 03, 1951 DOA: 12/29/2022 PCP: Lauro Regulus, MD   Assessment & Plan:   Principal Problem:   Hypokalemia Active Problems:   Hypocalcemia   Chronic atrial fibrillation (HCC)   Exertional dyspnea   GERD (gastroesophageal reflux disease)   Hyperlipidemia, mixed   Hypothyroidism   Hyponatremia   Hypophosphatemia   Pancreatitis   Metabolic acidosis  Assessment and Plan: Likely acute on chronic pancreatitis: continue on IVFs. Lipase is WNL. Advance diet to heart healthy. Intermittent nausea & abd pain after eating. Zofran prn nausea/vomiting. Percocet, morphine prn for pain   Hypokalemia: WNL    Hypocalcemia: labile. Encourage po intake    Metabolic acidosis: resolved   Leukocytosis: likely secondary to pancreatitis. Trending down    Hypophosphatemia: WNL today     Hypothyroidism: continue on levothyroxine    Chronic a. fib: continue on eliquis, metoprolol          DVT prophylaxis: eliquis Code Status: full  Family Communication: discussed pt's care w/ pt's family at bedside and answered their questions Disposition Plan: likely d/c back home   Level of care: Telemetry Cardiac  Status is: Inpatient Remains inpatient appropriate because: still has nausea & abd pain intermittently after eating     Consultants:    Procedures:   Antimicrobials:    Subjective: Pt c/o intermittent abd pain and nausea  Objective: Vitals:   01/01/23 2247 01/02/23 0323 01/02/23 0725 01/02/23 0739  BP: 104/68 (!) 149/82 135/72 (!) 106/56  Pulse: (!) 102 (!) 101 99 (!) 57  Resp:   14 18  Temp: 98.6 F (37 C)  98.6 F (37 C) 98.6 F (37 C)  TempSrc:   Oral Oral  SpO2: (!) 88% 93% 97% 92%  Weight:      Height:        Intake/Output Summary (Last 24 hours) at 01/02/2023 0815 Last data filed at 01/01/2023 1038 Gross per 24 hour  Intake 200 ml  Output --  Net 200 ml   Filed Weights   12/30/22 1445 12/30/22 1454  01/01/23 0555  Weight: 90.7 kg 100 kg 102.3 kg    Examination:  General exam: Appears calm & comfortable  Respiratory system: clear breath sounds b/l  Cardiovascular system: S1/S2+. No rubs or gallops Gastrointestinal system: abd is soft, tenderness to palpation, obese & hypoactive bowel sounds  Central nervous system: alert & awake. Moves all extremities Psychiatry: flat mood and affect     Data Reviewed: I have personally reviewed following labs and imaging studies  CBC: Recent Labs  Lab 12/29/22 1508 12/30/22 0355 12/31/22 0427 01/01/23 0524 01/02/23 0410  WBC 9.4 12.9* 14.4* 12.5* 11.1*  HGB 13.6 13.3 11.9* 11.7* 11.9*  HCT 40.6 40.7 36.7 37.3 36.6  MCV 86.0 88.7 90.0 91.9 90.6  PLT 235 214 201 161 175   Basic Metabolic Panel: Recent Labs  Lab 12/29/22 1508 12/29/22 1727 12/30/22 0355 12/31/22 0427 01/01/23 0524 01/02/23 0410  NA 139  --  134* 133* 132* 132*  K 2.1*  --  4.6  4.6 4.1 3.9 3.8  CL 120*  --  101 103 102 100  CO2 14*  --  28 25 24  21*  GLUCOSE 114*  --  129* 112* 90 78  BUN 16  --  16 17 16 14   CREATININE 0.42*  --  0.62 0.70 0.67 0.76  CALCIUM 6.1*  --  8.7* 8.7* 8.1* 8.5*  MG  --  1.8 1.7  --   --   --  PHOS  --  3.1 3.5  --   --   --    GFR: Estimated Creatinine Clearance: 83.1 mL/min (by C-G formula based on SCr of 0.76 mg/dL). Liver Function Tests: Recent Labs  Lab 12/29/22 1508  AST 14*  ALT 9  ALKPHOS 33*  BILITOT 0.8  PROT 4.6*  ALBUMIN 2.5*   Recent Labs  Lab 12/29/22 1508 12/31/22 0427 01/01/23 0524  LIPASE 708* 80* 28   No results for input(s): "AMMONIA" in the last 168 hours. Coagulation Profile: No results for input(s): "INR", "PROTIME" in the last 168 hours. Cardiac Enzymes: No results for input(s): "CKTOTAL", "CKMB", "CKMBINDEX", "TROPONINI" in the last 168 hours. BNP (last 3 results) No results for input(s): "PROBNP" in the last 8760 hours. HbA1C: No results for input(s): "HGBA1C" in the last 72  hours. CBG: No results for input(s): "GLUCAP" in the last 168 hours. Lipid Profile: No results for input(s): "CHOL", "HDL", "LDLCALC", "TRIG", "CHOLHDL", "LDLDIRECT" in the last 72 hours. Thyroid Function Tests: No results for input(s): "TSH", "T4TOTAL", "FREET4", "T3FREE", "THYROIDAB" in the last 72 hours. Anemia Panel: No results for input(s): "VITAMINB12", "FOLATE", "FERRITIN", "TIBC", "IRON", "RETICCTPCT" in the last 72 hours. Sepsis Labs: No results for input(s): "PROCALCITON", "LATICACIDVEN" in the last 168 hours.  No results found for this or any previous visit (from the past 240 hour(s)).       Radiology Studies: No results found.      Scheduled Meds:  apixaban  5 mg Oral BID   docusate sodium  200 mg Oral BID   levothyroxine  25 mcg Oral Q0600   metoprolol tartrate  50 mg Oral BID   pantoprazole  80 mg Oral Daily   polyethylene glycol  17 g Oral Daily   Continuous Infusions:  sodium chloride 75 mL/hr at 01/02/23 0330     LOS: 4 days    Time spent: 25 mins     Charise Killian, MD [facility] Pager [phone]  If 7PM-7AM, please contact night-coverage [email or web address] 01/02/2023, 8:15 AM

## 2023-01-03 DIAGNOSIS — E669 Obesity, unspecified: Secondary | ICD-10-CM

## 2023-01-03 DIAGNOSIS — K859 Acute pancreatitis without necrosis or infection, unspecified: Secondary | ICD-10-CM | POA: Diagnosis not present

## 2023-01-03 DIAGNOSIS — E871 Hypo-osmolality and hyponatremia: Secondary | ICD-10-CM | POA: Diagnosis not present

## 2023-01-03 LAB — CBC
HCT: 34 % — ABNORMAL LOW (ref 36.0–46.0)
Hemoglobin: 11.2 g/dL — ABNORMAL LOW (ref 12.0–15.0)
MCH: 28.9 pg (ref 26.0–34.0)
MCHC: 32.9 g/dL (ref 30.0–36.0)
MCV: 87.6 fL (ref 80.0–100.0)
Platelets: 223 10*3/uL (ref 150–400)
RBC: 3.88 MIL/uL (ref 3.87–5.11)
RDW: 12.9 % (ref 11.5–15.5)
WBC: 11.2 10*3/uL — ABNORMAL HIGH (ref 4.0–10.5)
nRBC: 0 % (ref 0.0–0.2)

## 2023-01-03 LAB — BASIC METABOLIC PANEL
Anion gap: 7 (ref 5–15)
BUN: 12 mg/dL (ref 8–23)
CO2: 26 mmol/L (ref 22–32)
Calcium: 8.4 mg/dL — ABNORMAL LOW (ref 8.9–10.3)
Chloride: 100 mmol/L (ref 98–111)
Creatinine, Ser: 0.67 mg/dL (ref 0.44–1.00)
GFR, Estimated: >60 mL/min (ref >60)
Glucose, Bld: 90 mg/dL (ref 70–99)
Potassium: 3.5 mmol/L (ref 3.5–5.1)
Sodium: 133 mmol/L — ABNORMAL LOW (ref 135–145)

## 2023-01-03 MED ORDER — OXYCODONE-ACETAMINOPHEN 7.5-325 MG PO TABS: 1.0000 | ORAL_TABLET | Freq: Four times a day (QID) | ORAL | 0 refills | Status: AC | PRN

## 2023-01-03 MED ORDER — ONDANSETRON HCL 8 MG PO TABS: 8.0000 mg | ORAL_TABLET | Freq: Three times a day (TID) | ORAL | 0 refills | Status: AC | PRN

## 2023-01-03 NOTE — Progress Notes (Signed)
Mobility Specialist - Progress Note    01/03/23 1109  Mobility  Activity Ambulated independently to bathroom  Level of Assistance Independent after set-up  Assistive Device None  Distance Ambulated (ft) 10 ft  Activity Response Tolerated well  Mobility Referral Yes  $Mobility charge 1 Mobility  Mobility Specialist Start Time (ACUTE ONLY) 1050  Mobility Specialist Stop Time (ACUTE ONLY) 1110  Mobility Specialist Time Calculation (min) (ACUTE ONLY) 20 min   Pt sitting EOB upon entry on RA. Pt STS and ambulates to bathroom indep with no AD. Pt returned to bed and left with needs in reach. Pt refused further mobility due to endorsed abdominal pain. RN Notified.   Johnathan Hausen Mobility Specialist 01/03/23, 11:13 AM

## 2023-01-03 NOTE — TOC Initial Note (Signed)
Transition of Care [facility]) - Initial/Assessment Note    Patient Details  Name: Molly Brown MRN: [id number] Date of Birth: May 11, 1951  Transition of Care [facility]) CM/SW Contact:    Truddie Hidden, RN Phone Number: 01/03/2023, 1:09 PM  Clinical Narrative:                  Transition of Care [facility]) Screening Note   Patient Details  Name: Molly Brown Date of Birth: 1951-03-01   Transition of Care [facility]) CM/SW Contact:    Truddie Hidden, RN Phone Number: 01/03/2023, 1:09 PM    Transition of Care Department [facility]) has reviewed patient and no TOC needs have been identified at this time. We will continue to monitor patient advancement through interdisciplinary progression rounds. If new patient transition needs arise, please place a TOC consult.          Patient Goals and CMS Choice            Expected Discharge Plan and Services         Expected Discharge Date: 01/03/23                                    Prior Living Arrangements/Services                       Activities of Daily Living Home Assistive Devices/Equipment: Dan Humphreys (specify type) (front wheel) ADL Screening (condition at time of admission) Patient's cognitive ability adequate to safely complete daily activities?: No Is the patient deaf or have difficulty hearing?: Yes Does the patient have difficulty seeing, even when wearing glasses/contacts?: No Does the patient have difficulty concentrating, remembering, or making decisions?: No Patient able to express need for assistance with ADLs?: Yes Does the patient have difficulty dressing or bathing?: No Independently performs ADLs?: Yes (appropriate for developmental age) Does the patient have difficulty walking or climbing stairs?: No Weakness of Legs: None Weakness of Arms/Hands: None  Permission Sought/Granted                  Emotional Assessment              Admission diagnosis:  Hypokalemia [E87.6] Patient Active Problem  List   Diagnosis Date Noted   Hypocalcemia 12/29/2022   Pancreatitis 12/29/2022   Metabolic acidosis 12/29/2022   Diverticulitis 10/04/2022   Situational anxiety 09/24/2022   Diverticulitis of colon 09/23/2022   Acute diverticulitis 09/23/2022   Hyponatremia 09/22/2022   Hypophosphatemia 09/22/2022   Hypokalemia 09/20/2022   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   Acute pancreatitis 09/19/2022   Acute metabolic encephalopathy 09/18/2022   Atrial fibrillation with RVR (HCC) 09/15/2022   Severe acute pancreatitis 09/15/2022   HOCM (hypertrophic obstructive cardiomyopathy) (HCC) 09/15/2022   Obesity (BMI 30-39.9) 09/15/2022   Gastroenteritis due to COVID-19 virus 10/06/2019   Pneumonia due to COVID-19 virus 10/06/2019   Hypothyroidism 11/03/2015   Elevated TSH 07/28/2015   Hyperlipidemia, mixed 07/01/2015   GERD (gastroesophageal reflux disease) 05/25/2015   Exertional dyspnea 06/05/2014   Routine general medical examination at a health care facility 02/24/2014   Encounter for routine gynecological examination 02/24/2014   Chronic atrial fibrillation (HCC) 02/07/2014   Dysphagia, unspecified(787.20) 01/23/2014   Other malaise and fatigue 01/23/2014   PCP:  Lauro Regulus, MD Pharmacy:   [facility] [address] [place] - [address] [place] [address] Phone: [phone]  Fax: [phone]     Social Determinants of Health (SDOH) Social History: SDOH Screenings   Food Insecurity: No Food Insecurity (12/30/2022)  Housing: Low Risk  (12/30/2022)  Transportation Needs: No Transportation Needs (12/30/2022)  Utilities: Not At Risk (12/30/2022)  Tobacco Use: Medium Risk (12/29/2022)   SDOH Interventions:     Readmission Risk Interventions     No data to display

## 2023-01-03 NOTE — Discharge Summary (Signed)
Physician Discharge Summary  Molly Brown ZOX:[id number] DOB: 17-Jun-1951 DOA: 12/29/2022  PCP: Molly Regulus, MD  Admit date: 12/29/2022 Discharge date: 01/03/2023  Admitted From: home  Disposition:  home   Recommendations for Outpatient Follow-up:  Follow up with PCP in 1-2 weeks  Home Health: no  Equipment/Devices:  Discharge Condition: stable  CODE STATUS: full  Diet recommendation: Heart Healthy  Brief/Interim Summary: HPI was taken from Dr. Sedalia Muta: 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   Vitals in the ED showed temperature of 98, respiration rate of 18, heart rate of 102, blood pressure 160/99, SpO2 of 99% on room air.   Serum sodium is 139, potassium 2.1, chloride 120, bicarb 14, BUN of 16, serum creatinine of 0.42, nonfasting blood glucose 114, WBC 9.4, hemoglobin 13.6, platelets of 235.   ED treatment: Morphine 4 mg IV, 3 doses ordered.  Potassium chloride 10 mill COVID IV one-time dose.  Calcium gluconate 1 g IV one-time dose. ------------------------ Patient was able to tell me her name, age, location, current calendar year.  Patient is lethargic and in discomfort at bedside.  She appears weak, mucosas are dry.   She reports that she started having abdominal pain, intractable nausea and vomiting yesterday.  She states that since her discharge from the hospital, she is gradually improved her strength and started eating normally again, until last evening.  She reports she had some strawberries yesterday, chicken, corn.   She states her vomitus was mostly food and white sputum.  She denies blood or coffee-ground emesis.  She denies dysuria, hematuria, diarrhea, syncope, loss of consciousness.   She endorses  feeling tremulous.  As per Dr. Mayford Knife 5/3-01/03/23: Pt was found to have likely acute on chronic pancreatitis. Pt was treated w/ IVFs, bowel rest, zofran & pain meds prn. Pt was tolerating po diet prior to d/c. Pt was d/c home w/ po zofran prn and po percocet prn   Discharge Diagnoses:  Principal Problem:   Hypokalemia Active Problems:   Hypocalcemia   Chronic atrial fibrillation (HCC)   Exertional dyspnea   GERD (gastroesophageal reflux disease)   Hyperlipidemia, mixed   Hypothyroidism   Hyponatremia   Hypophosphatemia   Pancreatitis   Metabolic acidosis  Likely acute on chronic pancreatitis: continue on IVFs. Lipase is WNL. Tolerating a po diet. Zofran prn nausea/vomiting. Percocet, morphine prn for pain   Hyponatremia: labile. Almost WNL   Hypokalemia: WNL    Hypocalcemia: labile. Encourage po intake    Metabolic acidosis: resolved   Leukocytosis: likely secondary to pancreatitis. Trending down    Hypophosphatemia: WNL today     Hypothyroidism: continue on levothyroxine    Chronic a. fib: continue on eliquis, metoprolol   Discharge Instructions  Discharge Instructions     Diet - low sodium heart healthy   Complete by: As directed    Discharge instructions   Complete by: As directed    F/u w/ PCP in 1-2 weeks   Increase activity slowly   Complete by: As directed       Allergies as of 01/03/2023       Reactions   Eszopiclone Other (See Comments)   Felt really weird   Flecainide Other (See Comments), Rash   Insomnia and felt drugged        Medication List  TAKE these medications    acetaminophen 325 MG tablet Commonly known as: TYLENOL Take 650 mg by mouth every 6 (six) hours as needed.   apixaban 5 MG Tabs tablet Commonly known as: ELIQUIS Take 1 tablet (5 mg total) by mouth 2 (two) times daily.   cyanocobalamin 500 MCG tablet Commonly known as: VITAMIN B12 Take 500 mcg by mouth daily.   diltiazem 180 MG 24 hr capsule Commonly known  as: CARDIZEM CD Take 1 capsule (180 mg total) by mouth daily.   levothyroxine 137 MCG tablet Commonly known as: SYNTHROID Take 137 mcg by mouth every morning.   metoprolol tartrate 50 MG tablet Commonly known as: LOPRESSOR Take 1 tablet (50 mg total) by mouth 2 (two) times daily.   multivitamin Liqd Take 5 mLs by mouth daily.   omeprazole 40 MG capsule Commonly known as: PRILOSEC TAKE 1 CAPSULE (40 MG TOTAL) BY MOUTH DAILY.   ondansetron 8 MG tablet Commonly known as: Zofran Take 1 tablet (8 mg total) by mouth every 8 (eight) hours as needed for up to 7 days for nausea or vomiting.   oxyCODONE-acetaminophen 7.5-325 MG tablet Commonly known as: PERCOCET Take 1 tablet by mouth every 6 (six) hours as needed for up to 5 days for moderate pain or severe pain.   polyethylene glycol 17 g packet Commonly known as: MIRALAX / GLYCOLAX Take 17 g by mouth as needed.   tacrolimus 0.1 % ointment Commonly known as: PROTOPIC Apply 1 Application topically 2 (two) times daily.        Allergies  Allergen Reactions   Eszopiclone Other (See Comments)    Felt really weird   Flecainide Other (See Comments) and Rash    Insomnia and felt drugged    Consultations:   Procedures/Studies: DG Chest Port 1 View  Result Date: 12/29/2022 CLINICAL DATA:  Abdominal pain, shortness of breath, intractable nausea and vomiting EXAM: PORTABLE CHEST 1 VIEW COMPARISON:  Portable exam 1807 hours compared to 09/23/2022 FINDINGS: Upper normal heart size. Stable mediastinal contours. Diffuse interstitial infiltrates in the mid to lower lungs, question pulmonary edema versus atypical infection. No pleural effusion or pneumothorax. Osseous structures unremarkable. IMPRESSION: Diffuse interstitial infiltrates of the mid to lower lungs question pulmonary edema versus atypical infection. Electronically Signed   By: Ulyses Southward M.D.   On: 12/29/2022 18:23   CT ABDOMEN PELVIS W CONTRAST  Result Date:  12/29/2022 CLINICAL DATA:  Nausea vomiting and abdominal pain prior history of pancreatitis and diverticulitis EXAM: CT ABDOMEN AND PELVIS WITH CONTRAST TECHNIQUE: Multidetector CT imaging of the abdomen and pelvis was performed using the standard protocol following bolus administration of intravenous contrast. RADIATION DOSE REDUCTION: This exam was performed according to the departmental dose-optimization program which includes automated exposure control, adjustment of the mA and/or kV according to patient size and/or use of iterative reconstruction technique. CONTRAST:  OMNIPAQUE IOHEXOL 300 MG/ML  SOLN COMPARISON:  CT 09/26/2022 and older FINDINGS: Lower chest: Breast implants are identified at the edge of the imaging field. The heart enlarged. Also component of left ventricular hypertrophy. There is some linear opacity at the bases likely scar or atelectasis. No pleural effusion. Hepatobiliary: No space-occupying liver lesion. Gallbladder is distended. Main portal vein is patent. There is some irregularity of the portal vein near the portal venous confluence. Some nonocclusive thrombus is suggested. This is new from previous. Pancreas: Extensive peripancreatic fat stranding and ill-defined fluid with phlegmonous areas. This maturation from the prior examination. However there is increasing fluid  and stranding extending along the anterior perinephric spaces bilaterally extending into the right pericolic gutter right-greater-than-left. The pancreas itself has some mixed areas of enhancement along the midbody. No rim enhancing fluid collections or soft tissue gas at this time. Spleen: Spleen is nonenlarged.  Splenic vein is patent. Adrenals/Urinary Tract: Adrenal glands are preserved. No enhancing renal mass or collecting system dilatation. Bosniak 1 bilateral renal cysts are stable, no additional follow-up. Preserved contours of the urinary bladder. Stomach/Bowel: Scattered colonic diverticula along left  side of the colon. The large bowel is nondilated. Minimal right-sided diverticula. The previous abscess along the distal descending, proximal sigmoid colon has resolved. Stranding is markedly improved. Small residual area on series 2, image 68 measures 10 mm. Normal appendix. Fluid in the stomach. There are some areas of wall thickening identified along the second and third portion of the duodenal adjacent to the inflammatory changes related to the patient's pancreatitis. These loops are nondilated. The small bowel is nondilated overall. Vascular/Lymphatic: Aortic atherosclerosis. No enlarged abdominal or pelvic lymph nodes. Reproductive: Status post hysterectomy. No adnexal masses. Other: Anasarca. There is some free fluid dependently in the pelvis, increased from previous. Musculoskeletal: Degenerative changes seen along the spine. Mild degenerative changes of the pelvis. IMPRESSION: Maturing changes of previous pancreatitis however there is some increasing fluid tracking into the adjacent tissues, anterior perinephric spaces and pericolic gutters. This could be an acute on chronic component. The pancreas itself has some heterogeneous enhancement but no gas. Evolving potential areas of pancreatic necrosis are possible. New subtle area of filling defect and irregularity along the portal vein near the portal venous confluence. Nonocclusive thrombus is possible. Improving abscess anterior to the distal descending, proximal sigmoid colon. 10 mm area of residual fluid and soft tissue in this location. No new changes of acute diverticulitis. Electronically Signed   By: Karen Kays M.D.   On: 12/29/2022 17:02   (Echo, Carotid, EGD, Colonoscopy, ERCP)    Subjective: Pt c/o fatigue    Discharge Exam: Vitals:   01/03/23 0351 01/03/23 0820  BP: 106/69 (!) 109/48  Pulse: (!) 105 (!) 107  Resp: 16 16  Temp: 98.6 F (37 C) 98.4 F (36.9 C)  SpO2: 96% 95%   Vitals:   01/02/23 2034 01/02/23 2322 01/03/23 0351  01/03/23 0820  BP: 139/73 104/73 106/69 (!) 109/48  Pulse: (!) 108 (!) 106 (!) 105 (!) 107  Resp: 16 16 16 16   Temp: 98.8 F (37.1 C) 99.2 F (37.3 C) 98.6 F (37 C) 98.4 F (36.9 C)  TempSrc: Axillary Oral Axillary Oral  SpO2:  95% 96% 95%  Weight:      Height:        General: Pt is alert, awake, not in acute distress Cardiovascular:  S1/S2 +, no rubs, no gallops Respiratory: CTA bilaterally, no wheezing, no rhonchi Abdominal: Soft, NT, obese, bowel sounds + Extremities: no edema, no cyanosis    The results of significant diagnostics from this hospitalization (including imaging, microbiology, ancillary and laboratory) are listed below for reference.     Microbiology: No results found for this or any previous visit (from the past 240 hour(s)).   Labs: BNP (last 3 results) Recent Labs    09/17/22 0452  BNP 186.5*   Basic Metabolic Panel: Recent Labs  Lab 12/29/22 1727 12/30/22 0355 12/31/22 0427 01/01/23 0524 01/02/23 0410 01/03/23 0539  NA  --  134* 133* 132* 132* 133*  K  --  4.6  4.6 4.1 3.9 3.8 3.5  CL  --  101 103 102 100 100  CO2  --  28 25 24  21* 26  GLUCOSE  --  129* 112* 90 78 90  BUN  --  16 17 16 14 12   CREATININE  --  0.62 0.70 0.67 0.76 0.67  CALCIUM  --  8.7* 8.7* 8.1* 8.5* 8.4*  MG 1.8 1.7  --   --   --   --   PHOS 3.1 3.5  --   --   --   --    Liver Function Tests: Recent Labs  Lab 12/29/22 1508  AST 14*  ALT 9  ALKPHOS 33*  BILITOT 0.8  PROT 4.6*  ALBUMIN 2.5*   Recent Labs  Lab 12/29/22 1508 12/31/22 0427 01/01/23 0524  LIPASE 708* 80* 28   No results for input(s): "AMMONIA" in the last 168 hours. CBC: Recent Labs  Lab 12/30/22 0355 12/31/22 0427 01/01/23 0524 01/02/23 0410 01/03/23 0539  WBC 12.9* 14.4* 12.5* 11.1* 11.2*  HGB 13.3 11.9* 11.7* 11.9* 11.2*  HCT 40.7 36.7 37.3 36.6 34.0*  MCV 88.7 90.0 91.9 90.6 87.6  PLT 214 201 161 175 223   Cardiac Enzymes: No results for input(s): "CKTOTAL", "CKMB",  "CKMBINDEX", "TROPONINI" in the last 168 hours. BNP: Invalid input(s): "POCBNP" CBG: No results for input(s): "GLUCAP" in the last 168 hours. D-Dimer No results for input(s): "DDIMER" in the last 72 hours. Hgb A1c No results for input(s): "HGBA1C" in the last 72 hours. Lipid Profile No results for input(s): "CHOL", "HDL", "LDLCALC", "TRIG", "CHOLHDL", "LDLDIRECT" in the last 72 hours. Thyroid function studies No results for input(s): "TSH", "T4TOTAL", "T3FREE", "THYROIDAB" in the last 72 hours.  Invalid input(s): "FREET3" Anemia work up No results for input(s): "VITAMINB12", "FOLATE", "FERRITIN", "TIBC", "IRON", "RETICCTPCT" in the last 72 hours. Urinalysis    Component Value Date/Time   COLORURINE YELLOW (A) 12/29/2022 2044   APPEARANCEUR CLEAR (A) 12/29/2022 2044   LABSPEC >1.046 (H) 12/29/2022 2044   PHURINE 5.0 12/29/2022 2044   GLUCOSEU NEGATIVE 12/29/2022 2044   HGBUR NEGATIVE 12/29/2022 2044   BILIRUBINUR NEGATIVE 12/29/2022 2044   KETONESUR 20 (A) 12/29/2022 2044   PROTEINUR 30 (A) 12/29/2022 2044   NITRITE NEGATIVE 12/29/2022 2044   LEUKOCYTESUR NEGATIVE 12/29/2022 2044   Sepsis Labs Recent Labs  Lab 12/31/22 0427 01/01/23 0524 01/02/23 0410 01/03/23 0539  WBC 14.4* 12.5* 11.1* 11.2*   Microbiology No results found for this or any previous visit (from the past 240 hour(s)).   Time coordinating discharge: Over 30 minutes  SIGNED:   Charise Killian, MD  [facility] 01/03/2023, 11:57 AM Pager   If 7PM-7AM, please contact night-coverage [email or web address]

## 2023-01-09 ENCOUNTER — Ambulatory Visit: Payer: Self-pay | Admitting: Gastroenterology

## 2023-01-10 ENCOUNTER — Encounter: Payer: Self-pay | Admitting: Gastroenterology

## 2023-01-10 ENCOUNTER — Ambulatory Visit: Payer: Self-pay | Admitting: Gastroenterology

## 2023-01-10 ENCOUNTER — Ambulatory Visit: Payer: Medicare HMO | Admitting: Gastroenterology

## 2023-01-10 VITALS — BP 128/88 | HR 66 | Temp 97.8°F | Ht 71.0 in | Wt 212.0 lb

## 2023-01-10 DIAGNOSIS — R2243 Localized swelling, mass and lump, lower limb, bilateral: Secondary | ICD-10-CM | POA: Diagnosis not present

## 2023-01-10 DIAGNOSIS — K5720 Diverticulitis of large intestine with perforation and abscess without bleeding: Secondary | ICD-10-CM | POA: Diagnosis not present

## 2023-01-10 DIAGNOSIS — K8591 Acute pancreatitis with uninfected necrosis, unspecified: Secondary | ICD-10-CM

## 2023-01-10 MED ORDER — PANCRELIPASE (LIP-PROT-AMYL) 36000-114000 UNITS PO CPEP
ORAL_CAPSULE | ORAL | 1 refills | Status: AC
Start: 2023-01-10 — End: ?

## 2023-01-10 MED ORDER — FUROSEMIDE 20 MG PO TABS
20.0000 mg | ORAL_TABLET | Freq: Every day | ORAL | 0 refills | Status: AC
Start: 2023-01-10 — End: ?

## 2023-01-10 NOTE — Progress Notes (Signed)
[name], MD [address]  [address]  [place], [place] [address]  Main: [phone]  Fax: [phone]    Gastroenterology Consultation  Referring Provider:     Lauro Regulus, MD Primary Care Physician:  Lauro Regulus, MD Primary Gastroenterologist:  Dr. [name] Reason for Consultation: Acute severe pancreatitis        HPI:   Molly Brown is a 72 y.o. female referred by Dr. Dareen Piano, Marya Amsler, MD  for consultation & management of acute severe pancreatitis.  Patient has history of chronic A-fib on Eliquis, history of hypertrophic obstructive cardiomyopathy with moderate MR, LVH, obstructive sleep apnea, obesity, dyslipidemia who was originally admitted to [facility] on 09/15/2022 secondary to acute abdominal pain, nausea and vomiting diagnosed with acute severe pancreatitis with possible hemorrhage, without evidence of necrosis her hospital course was complicated by contained perforated diverticulitis requiring TPN and managed conservatively with antibiotics.  General surgery was also consulted.  She was discharged home on 10/05/2022.  Patient was followed up by Dr. Everlene Farrier in the office on 10/17/2022 who recommended colonoscopy in future.  Patient reports that since she was released from the hospital, she was trying to eat small amount, healthy foods and slowly reintroduced eating bread, chicken sandwich, bacon, mayo, ranch, more sugary and high calorie foods which were her normal foods prior to the attack.  She had flareup of pancreatitis on 12/29/2022, went to the emergency room, was readmitted due to recurrent acute pancreatitis, based on serology and imaging.  She was managed conservatively and was discharged to home on 01/03/2023.  Since discharge, patient reports postprandial abdominal bloating and epigastric discomfort, does have bilateral swelling of legs, irregular bowel habits.  She is accompanied by her husband today.  She does not know how to manage her diet  moving forward.  She does not smoke or drink alcohol Patient does acknowledge that ever since she was retired, she has adopted unhealthy eating habits, late meals, staying up late nights and sleeping during the day, her sleep cycle has been completely altered and she gained weight as well  NSAIDs: None  Antiplts/Anticoagulants/Anti thrombotics: None  GI Procedures: None  Past Medical History:  Diagnosis Date   AF (atrial fibrillation) (HCC)    Anal fissure    Anxiety    Cardiac arrhythmia due to congenital heart disease    Chicken pox    Depression    GERD (gastroesophageal reflux disease)    Heart murmur     Past Surgical History:  Procedure Laterality Date   ABDOMINAL HYSTERECTOMY     AUGMENTATION MAMMAPLASTY Bilateral 1999   BREAST ENHANCEMENT SURGERY  2000   TONSILLECTOMY  1972   TOOTH EXTRACTION       Current Outpatient Medications:    acetaminophen (TYLENOL) 325 MG tablet, Take 650 mg by mouth every 6 (six) hours as needed., Disp: , Rfl:    apixaban (ELIQUIS) 5 MG TABS tablet, Take 1 tablet (5 mg total) by mouth 2 (two) times daily., Disp: 60 tablet, Rfl: 2   cyanocobalamin 500 MCG tablet, Take 500 mcg by mouth daily., Disp: , Rfl:    furosemide (LASIX) 20 MG tablet, Take 1 tablet (20 mg total) by mouth daily., Disp: 14 tablet, Rfl: 0   levothyroxine (SYNTHROID) 137 MCG tablet, Take 137 mcg by mouth every morning., Disp: , Rfl:    lipase/protease/amylase (CREON) 36000 UNITS CPEP capsule, Take 2 capsules with the first bite of each meal and 1 capsule with the first  bite of each snack, Disp: 240 capsule, Rfl: 1   metoprolol tartrate (LOPRESSOR) 50 MG tablet, Take 1 tablet (50 mg total) by mouth 2 (two) times daily., Disp: 60 tablet, Rfl: 1   Multiple Vitamin (MULTIVITAMIN) LIQD, Take 5 mLs by mouth daily., Disp: , Rfl:    omeprazole (PRILOSEC) 40 MG capsule, TAKE 1 CAPSULE (40 MG TOTAL) BY MOUTH DAILY. (Patient taking differently: Take 40 mg by mouth daily.), Disp: 90  capsule, Rfl: 3   ondansetron (ZOFRAN) 8 MG tablet, Take 1 tablet (8 mg total) by mouth every 8 (eight) hours as needed for up to 7 days for nausea or vomiting., Disp: 20 tablet, Rfl: 0   polyethylene glycol (MIRALAX / GLYCOLAX) packet, Take 17 g by mouth as needed., Disp: , Rfl:    tacrolimus (PROTOPIC) 0.1 % ointment, Apply 1 Application topically 2 (two) times daily., Disp: , Rfl:    Family History  Problem Relation Age of Onset   Arthritis Mother    Heart disease Father    Arthritis Maternal Grandmother    Arthritis Paternal Grandmother    Heart disease Paternal Grandmother    Breast cancer Neg Hx      Social History   Tobacco Use   Smoking status: Former    Types: Cigarettes   Smokeless tobacco: Never  Substance Use Topics   Alcohol use: No   Drug use: No    Allergies as of 01/10/2023 - Review Complete 01/10/2023  Allergen Reaction Noted   Eszopiclone Other (See Comments) 03/18/2016   Flecainide Other (See Comments) and Rash 05/25/2015    Review of Systems:    All systems reviewed and negative except where noted in HPI.   Physical Exam:  BP 128/88 (BP Location: Left Arm, Patient Position: Sitting, Cuff Size: Normal)   Pulse 66   Temp 97.8 F (36.6 C) (Oral)   Ht 5\' 11"  (1.803 m)   Wt 212 lb (96.2 kg)   BMI 29.57 kg/m  No LMP recorded. Patient has had a hysterectomy.  General:   Alert,  Well-developed, well-nourished, pleasant and cooperative in NAD Head:  Normocephalic and atraumatic. Eyes:  Sclera clear, no icterus.   Conjunctiva pink. Ears:  Normal auditory acuity. Nose:  No deformity, discharge, or lesions. Mouth:  No deformity or lesions,oropharynx pink & moist. Neck:  Supple; no masses or thyromegaly. Lungs:  Respirations even and unlabored.  Clear throughout to auscultation.   No wheezes, crackles, or rhonchi. No acute distress. Heart:  Regular rate and rhythm; no murmurs, clicks, rubs, or gallops. Abdomen:  Normal bowel sounds. Soft, non-tender and  mildly distended, tympanic without masses, hepatosplenomegaly or hernias noted.  No guarding or rebound tenderness.   Rectal: Not performed Msk:  Symmetrical without gross deformities. Good, equal movement & strength bilaterally. Pulses:  Normal pulses noted. Extremities:  No clubbing or edema.  No cyanosis. Neurologic:  Alert and oriented x3;  grossly normal neurologically. Skin:  Intact without significant lesions or rashes. No jaundice. Psych:  Alert and cooperative. Normal mood and affect.  Imaging Studies: Reviewed  Assessment and Plan:   Molly Brown is a 72 y.o. female with metabolic syndrome, hypothyroidism, A-fib on Eliquis with recent history of acute interstitial edematous pancreatitis with fluid collections and some signs of hemorrhagic or proteinaceous change and peripancreatic necrosis associated with mass effect upon the portal vein and root of the small bowel mesentery.  Severe acute pancreatitis of unclear etiology: Clinically improving IgG4 levels are normal Serum triglycerides normal ? pancreas divisum  based on MRCP ?  Possible nonocclusive thrombus along the portal vein near the portal venous confluence, patient is already on Eliquis for A-fib Discussed regarding strict low-fat, low-carb, high-protein, small, frequent meals Recommend pancreatic enzymes, Creon or Zenpep, samples provided Referral to pancreas specialist at [facility] given possibility of pancreas divisum No evidence of cholelithiasis based on imaging No evidence of biliary obstruction, LFTs were normal Recommend repeat MRI pancreas protocol in 4 to 6 weeks to evaluate for organized fluid collections, walled off necrosis   Contained perforated sigmoid diverticulitis with abscess which is improving based on the CT from 12/29/2022 Will defer colonoscopy until patient fully recovers from recent episode of severe acute pancreatitis Recommend to take Metamucil and MiraLAX daily to keep bowels  regular  Bilateral swelling of legs Discussed about strict low-sodium diet Keep legs elevated when laying down or sitting Trial of low-dose Lasix 20 mg daily in the morning If persistent, follow-up with PCP or cardiology  Follow up in 3 months   [name], MD

## 2023-01-10 NOTE — Patient Instructions (Addendum)
Gave samples of the creon take 2 capsules with the first bite of each meal and 1 capsule with the first bite of each snack  Heart-Healthy Eating Plan Many factors influence your heart health, including eating and exercise habits. Heart health is also called coronary health. Coronary risk increases with abnormal blood fat (lipid) levels. A heart-healthy eating plan includes limiting unhealthy fats, increasing healthy fats, limiting salt (sodium) intake, and making other diet and lifestyle changes. What is my plan? Your health care provider may recommend that: You limit your fat intake to _________% or less of your total calories each day. You limit your saturated fat intake to _________% or less of your total calories each day. You limit the amount of cholesterol in your diet to less than _________ mg per day. You limit the amount of sodium in your diet to less than _________ mg per day. What are tips for following this plan? Cooking Cook foods using methods other than frying. Baking, boiling, grilling, and broiling are all good options. Other ways to reduce fat include: Removing the skin from poultry. Removing all visible fats from meats. Steaming vegetables in water or broth. Meal planning  At meals, imagine dividing your plate into fourths: Fill one-half of your plate with vegetables and green salads. Fill one-fourth of your plate with whole grains. Fill one-fourth of your plate with lean protein foods. Eat 2-4 cups of vegetables per day. One cup of vegetables equals 1 cup (91 g) broccoli or cauliflower florets, 2 medium carrots, 1 large bell pepper, 1 large sweet potato, 1 large tomato, 1 medium white potato, 2 cups (150 g) raw leafy greens. Eat 1-2 cups of fruit per day. One cup of fruit equals 1 small apple, 1 large banana, 1 cup (237 g) mixed fruit, 1 large orange,  cup (82 g) dried fruit, 1 cup (240 mL) 100% fruit juice. Eat more foods that contain soluble fiber. Examples include  apples, broccoli, carrots, beans, peas, and barley. Aim to get 25-30 g of fiber per day. Increase your consumption of legumes, nuts, and seeds to 4-5 servings per week. One serving of dried beans or legumes equals  cup (90 g) cooked, 1 serving of nuts is  oz (12 almonds, 24 pistachios, or 7 walnut halves), and 1 serving of seeds equals  oz (8 g). Fats Choose healthy fats more often. Choose monounsaturated and polyunsaturated fats, such as olive and canola oils, avocado oil, flaxseeds, walnuts, almonds, and seeds. Eat more omega-3 fats. Choose salmon, mackerel, sardines, tuna, flaxseed oil, and ground flaxseeds. Aim to eat fish at least 2 times each week. Check food labels carefully to identify foods with trans fats or high amounts of saturated fat. Limit saturated fats. These are found in animal products, such as meats, butter, and cream. Plant sources of saturated fats include palm oil, palm kernel oil, and coconut oil. Avoid foods with partially hydrogenated oils in them. These contain trans fats. Examples are stick margarine, some tub margarines, cookies, crackers, and other baked goods. Avoid fried foods. General information Eat more home-cooked food and less restaurant, buffet, and fast food. Limit or avoid alcohol. Limit foods that are high in added sugar and simple starches such as foods made using white refined flour (white breads, pastries, sweets). Lose weight if you are overweight. Losing just 5-10% of your body weight can help your overall health and prevent diseases such as diabetes and heart disease. Monitor your sodium intake, especially if you have high blood pressure. Talk with  your health care provider about your sodium intake. Try to incorporate more vegetarian meals weekly. What foods should I eat? Fruits All fresh, canned (in natural juice), or frozen fruits. Vegetables Fresh or frozen vegetables (raw, steamed, roasted, or grilled). Green salads. Grains Most grains.  Choose whole wheat and whole grains most of the time. Rice and pasta, including brown rice and pastas made with whole wheat. Meats and other proteins Lean, well-trimmed beef, veal, pork, and lamb. Chicken and [place] without skin. All fish and shellfish. Wild duck, rabbit, pheasant, and venison. Egg whites or low-cholesterol egg substitutes. Dried beans, peas, lentils, and tofu. Seeds and most nuts. Dairy Low-fat or nonfat cheeses, including ricotta and mozzarella. Skim or 1% milk (liquid, powdered, or evaporated). Buttermilk made with low-fat milk. Nonfat or low-fat yogurt. Fats and oils Non-hydrogenated (trans-free) margarines. Vegetable oils, including soybean, sesame, sunflower, olive, avocado, peanut, safflower, corn, canola, and cottonseed. Salad dressings or mayonnaise made with a vegetable oil. Beverages Water (mineral or sparkling). Coffee and tea. Unsweetened ice tea. Diet beverages. Sweets and desserts Sherbet, gelatin, and fruit ice. Small amounts of dark chocolate. Limit all sweets and desserts. Seasonings and condiments All seasonings and condiments. The items listed above may not be a complete list of foods and beverages you can eat. Contact a dietitian for more options. What foods should I avoid? Fruits Canned fruit in heavy syrup. Fruit in cream or butter sauce. Fried fruit. Limit coconut. Vegetables Vegetables cooked in cheese, cream, or butter sauce. Fried vegetables. Grains Breads made with saturated or trans fats, oils, or whole milk. Croissants. Sweet rolls. Donuts. High-fat crackers, such as cheese crackers and chips. Meats and other proteins Fatty meats, such as hot dogs, ribs, sausage, bacon, rib-eye roast or steak. High-fat deli meats, such as salami and bologna. Caviar. Domestic duck and goose. Organ meats, such as liver. Dairy Cream, sour cream, cream cheese, and creamed cottage cheese. Whole-milk cheeses. Whole or 2% milk (liquid, evaporated, or condensed). Whole  buttermilk. Cream sauce or high-fat cheese sauce. Whole-milk yogurt. Fats and oils Meat fat, or shortening. Cocoa butter, hydrogenated oils, palm oil, coconut oil, palm kernel oil. Solid fats and shortenings, including bacon fat, salt pork, lard, and butter. Nondairy cream substitutes. Salad dressings with cheese or sour cream. Beverages Regular sodas and any drinks with added sugar. Sweets and desserts Frosting. Pudding. Cookies. Cakes. Pies. Milk chocolate or white chocolate. Buttered syrups. Full-fat ice cream or ice cream drinks. The items listed above may not be a complete list of foods and beverages to avoid. Contact a dietitian for more information. Summary Heart-healthy meal planning includes limiting unhealthy fats, increasing healthy fats, limiting salt (sodium) intake and making other diet and lifestyle changes. Lose weight if you are overweight. Losing just 5-10% of your body weight can help your overall health and prevent diseases such as diabetes and heart disease. Focus on eating a balance of foods, including fruits and vegetables, low-fat or nonfat dairy, lean protein, nuts and legumes, whole grains, and heart-healthy oils and fats. This information is not intended to replace advice given to you by your health care provider. Make sure you discuss any questions you have with your health care provider. Document Revised: 09/20/2021 Document Reviewed: 09/20/2021 Elsevier Patient Education  2023 [employer].

## 2023-01-11 ENCOUNTER — Telehealth: Payer: Self-pay

## 2023-01-11 NOTE — Telephone Encounter (Signed)
Faxed referral to [facility] to a Pancreases specialist for diagnosis of pancreas divisum. Put to see Dr. Corliss Parish. Attach office visit note, demographics, medications, insurance, CT results. Got confirmation fax went through

## 2023-01-25 ENCOUNTER — Telehealth: Payer: Self-pay

## 2023-01-25 DIAGNOSIS — K5720 Diverticulitis of large intestine with perforation and abscess without bleeding: Secondary | ICD-10-CM

## 2023-01-25 DIAGNOSIS — K8591 Acute pancreatitis with uninfected necrosis, unspecified: Secondary | ICD-10-CM

## 2023-01-25 NOTE — Telephone Encounter (Signed)
Called [facility] and they state that the referral is ready to schedule  and they should be reaching out to her scheduling in the next couple of days. They said the patient could reach out to them to schedule also. Called patient gave her the number which Is [phone] option 1. She wrote down the number and she states she will call them to schedule the appointment. She states she appreciates everything you did for her at the appointment on 01/10/2023 she states she is following the diet  and has loss 17 pounds since 01/10/23. She is eating fresh fruits and fresh meats. She states she still has low energy and has off on abdominal pain. She states she is going to keep it going

## 2023-01-25 NOTE — Telephone Encounter (Signed)
-----   Message from Denman George, CMA sent at 01/11/2023  8:03 AM EDT ----- Check on GI referral to [facility]

## 2023-01-25 NOTE — Telephone Encounter (Signed)
Not sure when her appointment with [facility] would be scheduled.  Let's go ahead and schedule MRI pancreas protocol for end of June 2024 She should continue taking Creon as prescribed and I will see her for follow-up appointment  Dx: Acute severe pancreatitis  RV

## 2023-01-26 NOTE — Telephone Encounter (Signed)
I recommend her to wait for both dental and eye procedures until we have MRI results  RV

## 2023-01-26 NOTE — Telephone Encounter (Signed)
Called and got patient schedule for MRI on  02/21/2023 [place] 9:30 [place] [facility] nothing to eat or drink 4 hours before the scan. Called patient and patient verbalized understanding of instructions. She states she called [facility] and they said Corliss Parish was not taking new patient but he review patient chart and places patient to the specific doctor on his team. She has a appointment with Adalberto Cole on 02/14/2023 at 12:40.  She states she has two questions she states she still having some off and on abdominal pain and has no energy. She states she needs two cavity filled and is scared to have them filled she is wondering if she should wait some more time to have them filled or go on and schedule to have it done. Second she states she cataracts on both eyes and her vision is effected by this and needs to be removed. She is scared to have the surgery and wants to know what you think about her having this done

## 2023-01-26 NOTE — Addendum Note (Signed)
Addended by: Radene Knee L on: 01/26/2023 10:37 AM   Modules accepted: Orders

## 2023-01-26 NOTE — Telephone Encounter (Signed)
Patient verbalized understanding  

## 2023-01-26 NOTE — Addendum Note (Signed)
Addended by: Radene Knee L on: 01/26/2023 10:16 AM   Modules accepted: Orders

## 2023-02-14 DIAGNOSIS — Q453 Other congenital malformations of pancreas and pancreatic duct: Secondary | ICD-10-CM | POA: Diagnosis not present

## 2023-02-14 DIAGNOSIS — K5720 Diverticulitis of large intestine with perforation and abscess without bleeding: Secondary | ICD-10-CM | POA: Diagnosis not present

## 2023-02-14 DIAGNOSIS — K859 Acute pancreatitis without necrosis or infection, unspecified: Secondary | ICD-10-CM | POA: Diagnosis not present

## 2023-02-21 ENCOUNTER — Ambulatory Visit: Payer: Medicare HMO

## 2023-03-06 ENCOUNTER — Ambulatory Visit: Payer: Medicare HMO | Admitting: Surgery

## 2023-04-13 DIAGNOSIS — G4733 Obstructive sleep apnea (adult) (pediatric): Secondary | ICD-10-CM | POA: Diagnosis not present

## 2023-04-13 DIAGNOSIS — I482 Chronic atrial fibrillation, unspecified: Secondary | ICD-10-CM | POA: Diagnosis not present

## 2023-04-13 DIAGNOSIS — I421 Obstructive hypertrophic cardiomyopathy: Secondary | ICD-10-CM | POA: Diagnosis not present

## 2023-04-13 DIAGNOSIS — E782 Mixed hyperlipidemia: Secondary | ICD-10-CM | POA: Diagnosis not present

## 2023-04-13 DIAGNOSIS — I119 Hypertensive heart disease without heart failure: Secondary | ICD-10-CM | POA: Diagnosis not present

## 2023-04-24 DIAGNOSIS — I482 Chronic atrial fibrillation, unspecified: Secondary | ICD-10-CM | POA: Diagnosis not present

## 2023-04-24 DIAGNOSIS — I421 Obstructive hypertrophic cardiomyopathy: Secondary | ICD-10-CM | POA: Diagnosis not present

## 2023-04-26 ENCOUNTER — Ambulatory Visit: Payer: Medicare HMO | Admitting: Gastroenterology

## 2023-05-03 ENCOUNTER — Ambulatory Visit: Payer: Medicare HMO | Admitting: Surgery

## 2023-05-25 DIAGNOSIS — H2513 Age-related nuclear cataract, bilateral: Secondary | ICD-10-CM | POA: Diagnosis not present

## 2023-05-25 DIAGNOSIS — H538 Other visual disturbances: Secondary | ICD-10-CM | POA: Diagnosis not present

## 2023-05-25 DIAGNOSIS — Z0100 Encounter for examination of eyes and vision without abnormal findings: Secondary | ICD-10-CM | POA: Diagnosis not present

## 2023-05-25 DIAGNOSIS — H2512 Age-related nuclear cataract, left eye: Secondary | ICD-10-CM | POA: Diagnosis not present

## 2023-06-07 ENCOUNTER — Encounter: Payer: Self-pay | Admitting: Anesthesiology

## 2023-06-07 ENCOUNTER — Encounter: Payer: Self-pay | Admitting: Ophthalmology

## 2023-06-07 DIAGNOSIS — H2511 Age-related nuclear cataract, right eye: Secondary | ICD-10-CM | POA: Diagnosis not present

## 2023-06-08 ENCOUNTER — Encounter: Payer: Self-pay | Admitting: Ophthalmology

## 2023-06-19 ENCOUNTER — Ambulatory Visit: Admission: RE | Admit: 2023-06-19 | Payer: Medicare HMO | Source: Home / Self Care | Admitting: Ophthalmology

## 2023-06-19 HISTORY — DX: Obstructive hypertrophic cardiomyopathy: I42.1

## 2023-06-19 HISTORY — DX: Nonrheumatic mitral (valve) insufficiency: I34.0

## 2023-06-19 HISTORY — DX: Sleep apnea, unspecified: G47.30

## 2023-06-19 SURGERY — PHACOEMULSIFICATION, CATARACT, WITH IOL INSERTION
Anesthesia: Topical | Laterality: Right

## 2023-07-04 DIAGNOSIS — H2511 Age-related nuclear cataract, right eye: Secondary | ICD-10-CM | POA: Diagnosis not present

## 2023-07-05 DIAGNOSIS — H2512 Age-related nuclear cataract, left eye: Secondary | ICD-10-CM | POA: Diagnosis not present

## 2023-07-05 DIAGNOSIS — H538 Other visual disturbances: Secondary | ICD-10-CM | POA: Diagnosis not present

## 2023-07-10 ENCOUNTER — Ambulatory Visit: Admit: 2023-07-10 | Payer: Medicare HMO | Admitting: Ophthalmology

## 2023-07-10 SURGERY — PHACOEMULSIFICATION, CATARACT, WITH IOL INSERTION
Anesthesia: Topical | Laterality: Left

## 2023-07-13 DIAGNOSIS — H903 Sensorineural hearing loss, bilateral: Secondary | ICD-10-CM | POA: Diagnosis not present

## 2023-07-19 DIAGNOSIS — H903 Sensorineural hearing loss, bilateral: Secondary | ICD-10-CM | POA: Diagnosis not present

## 2023-07-21 DIAGNOSIS — H2512 Age-related nuclear cataract, left eye: Secondary | ICD-10-CM | POA: Diagnosis not present

## 2023-08-01 DIAGNOSIS — I482 Chronic atrial fibrillation, unspecified: Secondary | ICD-10-CM | POA: Diagnosis not present

## 2023-08-01 DIAGNOSIS — R7303 Prediabetes: Secondary | ICD-10-CM | POA: Diagnosis not present

## 2023-08-01 DIAGNOSIS — E039 Hypothyroidism, unspecified: Secondary | ICD-10-CM | POA: Diagnosis not present

## 2023-08-07 DIAGNOSIS — R7303 Prediabetes: Secondary | ICD-10-CM | POA: Diagnosis not present

## 2023-08-07 DIAGNOSIS — E039 Hypothyroidism, unspecified: Secondary | ICD-10-CM | POA: Diagnosis not present

## 2023-08-07 DIAGNOSIS — I482 Chronic atrial fibrillation, unspecified: Secondary | ICD-10-CM | POA: Diagnosis not present

## 2023-08-07 DIAGNOSIS — Z2821 Immunization not carried out because of patient refusal: Secondary | ICD-10-CM | POA: Diagnosis not present

## 2023-12-04 DIAGNOSIS — G4733 Obstructive sleep apnea (adult) (pediatric): Secondary | ICD-10-CM | POA: Diagnosis not present

## 2023-12-04 DIAGNOSIS — R7303 Prediabetes: Secondary | ICD-10-CM | POA: Diagnosis not present

## 2023-12-04 DIAGNOSIS — E669 Obesity, unspecified: Secondary | ICD-10-CM | POA: Diagnosis not present

## 2023-12-04 DIAGNOSIS — E782 Mixed hyperlipidemia: Secondary | ICD-10-CM | POA: Diagnosis not present

## 2023-12-04 DIAGNOSIS — I482 Chronic atrial fibrillation, unspecified: Secondary | ICD-10-CM | POA: Diagnosis not present

## 2023-12-04 DIAGNOSIS — I421 Obstructive hypertrophic cardiomyopathy: Secondary | ICD-10-CM | POA: Diagnosis not present

## 2024-01-30 DIAGNOSIS — E039 Hypothyroidism, unspecified: Secondary | ICD-10-CM | POA: Diagnosis not present

## 2024-01-30 DIAGNOSIS — R7303 Prediabetes: Secondary | ICD-10-CM | POA: Diagnosis not present

## 2024-01-30 DIAGNOSIS — I482 Chronic atrial fibrillation, unspecified: Secondary | ICD-10-CM | POA: Diagnosis not present

## 2024-02-05 DIAGNOSIS — I482 Chronic atrial fibrillation, unspecified: Secondary | ICD-10-CM | POA: Diagnosis not present

## 2024-02-05 DIAGNOSIS — Z0000 Encounter for general adult medical examination without abnormal findings: Secondary | ICD-10-CM | POA: Diagnosis not present

## 2024-02-05 DIAGNOSIS — Z1331 Encounter for screening for depression: Secondary | ICD-10-CM | POA: Diagnosis not present

## 2024-02-05 DIAGNOSIS — E039 Hypothyroidism, unspecified: Secondary | ICD-10-CM | POA: Diagnosis not present

## 2024-02-05 DIAGNOSIS — R7303 Prediabetes: Secondary | ICD-10-CM | POA: Diagnosis not present

## 2024-02-05 DIAGNOSIS — I421 Obstructive hypertrophic cardiomyopathy: Secondary | ICD-10-CM | POA: Diagnosis not present

## 2024-04-01 DIAGNOSIS — E039 Hypothyroidism, unspecified: Secondary | ICD-10-CM | POA: Diagnosis not present

## 2024-05-27 DIAGNOSIS — E039 Hypothyroidism, unspecified: Secondary | ICD-10-CM | POA: Diagnosis not present

## 2024-06-12 DIAGNOSIS — E782 Mixed hyperlipidemia: Secondary | ICD-10-CM | POA: Diagnosis not present

## 2024-06-12 DIAGNOSIS — I421 Obstructive hypertrophic cardiomyopathy: Secondary | ICD-10-CM | POA: Diagnosis not present

## 2024-06-12 DIAGNOSIS — R7303 Prediabetes: Secondary | ICD-10-CM | POA: Diagnosis not present

## 2024-06-12 DIAGNOSIS — G4733 Obstructive sleep apnea (adult) (pediatric): Secondary | ICD-10-CM | POA: Diagnosis not present

## 2024-06-12 DIAGNOSIS — I119 Hypertensive heart disease without heart failure: Secondary | ICD-10-CM | POA: Diagnosis not present

## 2024-06-12 DIAGNOSIS — I482 Chronic atrial fibrillation, unspecified: Secondary | ICD-10-CM | POA: Diagnosis not present
